# Patient Record
Sex: Male | Born: 1965 | Race: White | Hispanic: No | Marital: Single | Smoking: Never smoker
Health system: Southern US, Community
[De-identification: ages and names within clinical notes are randomized; demographics above are authoritative.]

## PROBLEM LIST (undated history)

## (undated) DIAGNOSIS — I10 Essential (primary) hypertension: Secondary | ICD-10-CM

## (undated) DIAGNOSIS — G40909 Epilepsy, unspecified, not intractable, without status epilepticus: Secondary | ICD-10-CM

## (undated) HISTORY — PX: CHOLECYSTECTOMY: SHX55

---

## 2013-11-25 DIAGNOSIS — Z8249 Family history of ischemic heart disease and other diseases of the circulatory system: Secondary | ICD-10-CM | POA: Insufficient documentation

## 2013-11-25 DIAGNOSIS — J309 Allergic rhinitis, unspecified: Secondary | ICD-10-CM | POA: Insufficient documentation

## 2019-09-24 DIAGNOSIS — G2581 Restless legs syndrome: Secondary | ICD-10-CM | POA: Insufficient documentation

## 2019-09-24 DIAGNOSIS — I10 Essential (primary) hypertension: Secondary | ICD-10-CM | POA: Insufficient documentation

## 2019-09-24 DIAGNOSIS — E782 Mixed hyperlipidemia: Secondary | ICD-10-CM | POA: Insufficient documentation

## 2019-09-24 DIAGNOSIS — E6609 Other obesity due to excess calories: Secondary | ICD-10-CM | POA: Insufficient documentation

## 2019-09-24 DIAGNOSIS — M129 Arthropathy, unspecified: Secondary | ICD-10-CM | POA: Insufficient documentation

## 2021-07-09 ENCOUNTER — Emergency Department (HOSPITAL_COMMUNITY): Payer: 59

## 2021-07-09 ENCOUNTER — Inpatient Hospital Stay (HOSPITAL_COMMUNITY)
Admission: EM | Admit: 2021-07-09 | Discharge: 2021-07-20 | DRG: 511 | Disposition: A | Discharge status: Home Health | Payer: Worker's Compensation | Attending: General Surgery | Admitting: General Surgery

## 2021-07-09 ENCOUNTER — Encounter (HOSPITAL_COMMUNITY): Payer: Self-pay | Admitting: *Deleted

## 2021-07-09 ENCOUNTER — Emergency Department (HOSPITAL_COMMUNITY): Payer: Worker's Compensation

## 2021-07-09 ENCOUNTER — Other Ambulatory Visit: Payer: Self-pay

## 2021-07-09 ENCOUNTER — Emergency Department (HOSPITAL_COMMUNITY): Payer: 59 | Attending: Emergency Medicine

## 2021-07-09 DIAGNOSIS — E785 Hyperlipidemia, unspecified: Secondary | ICD-10-CM | POA: Diagnosis present

## 2021-07-09 DIAGNOSIS — Z79899 Other long term (current) drug therapy: Secondary | ICD-10-CM | POA: Diagnosis not present

## 2021-07-09 DIAGNOSIS — W11XXXA Fall on and from ladder, initial encounter: Secondary | ICD-10-CM | POA: Diagnosis present

## 2021-07-09 DIAGNOSIS — S52611A Displaced fracture of right ulna styloid process, initial encounter for closed fracture: Secondary | ICD-10-CM | POA: Diagnosis present

## 2021-07-09 DIAGNOSIS — F17220 Nicotine dependence, chewing tobacco, uncomplicated: Secondary | ICD-10-CM | POA: Diagnosis present

## 2021-07-09 DIAGNOSIS — Z880 Allergy status to penicillin: Secondary | ICD-10-CM

## 2021-07-09 DIAGNOSIS — M549 Dorsalgia, unspecified: Secondary | ICD-10-CM | POA: Diagnosis present

## 2021-07-09 DIAGNOSIS — R911 Solitary pulmonary nodule: Secondary | ICD-10-CM | POA: Diagnosis present

## 2021-07-09 DIAGNOSIS — S27892A Contusion of other specified intrathoracic organs, initial encounter: Secondary | ICD-10-CM | POA: Diagnosis present

## 2021-07-09 DIAGNOSIS — Z7982 Long term (current) use of aspirin: Secondary | ICD-10-CM

## 2021-07-09 DIAGNOSIS — I10 Essential (primary) hypertension: Secondary | ICD-10-CM | POA: Diagnosis present

## 2021-07-09 DIAGNOSIS — S52571A Other intraarticular fracture of lower end of right radius, initial encounter for closed fracture: Secondary | ICD-10-CM | POA: Diagnosis present

## 2021-07-09 DIAGNOSIS — S22089A Unspecified fracture of T11-T12 vertebra, initial encounter for closed fracture: Secondary | ICD-10-CM | POA: Diagnosis present

## 2021-07-09 DIAGNOSIS — G4733 Obstructive sleep apnea (adult) (pediatric): Secondary | ICD-10-CM | POA: Diagnosis not present

## 2021-07-09 DIAGNOSIS — Z20822 Contact with and (suspected) exposure to covid-19: Secondary | ICD-10-CM | POA: Diagnosis present

## 2021-07-09 DIAGNOSIS — S62101A Fracture of unspecified carpal bone, right wrist, initial encounter for closed fracture: Secondary | ICD-10-CM

## 2021-07-09 DIAGNOSIS — S52501A Unspecified fracture of the lower end of right radius, initial encounter for closed fracture: Secondary | ICD-10-CM | POA: Diagnosis not present

## 2021-07-09 DIAGNOSIS — T1490XA Injury, unspecified, initial encounter: Secondary | ICD-10-CM

## 2021-07-09 DIAGNOSIS — E876 Hypokalemia: Secondary | ICD-10-CM | POA: Diagnosis present

## 2021-07-09 DIAGNOSIS — G40909 Epilepsy, unspecified, not intractable, without status epilepticus: Secondary | ICD-10-CM | POA: Diagnosis present

## 2021-07-09 DIAGNOSIS — W19XXXA Unspecified fall, initial encounter: Secondary | ICD-10-CM | POA: Diagnosis present

## 2021-07-09 DIAGNOSIS — S22009A Unspecified fracture of unspecified thoracic vertebra, initial encounter for closed fracture: Secondary | ICD-10-CM

## 2021-07-09 DIAGNOSIS — M4854XA Collapsed vertebra, not elsewhere classified, thoracic region, initial encounter for fracture: Secondary | ICD-10-CM | POA: Diagnosis present

## 2021-07-09 DIAGNOSIS — S22080A Wedge compression fracture of T11-T12 vertebra, initial encounter for closed fracture: Secondary | ICD-10-CM

## 2021-07-09 HISTORY — DX: Essential (primary) hypertension: I10

## 2021-07-09 HISTORY — DX: Epilepsy, unspecified, not intractable, without status epilepticus: G40.909

## 2021-07-09 LAB — PROTIME-INR
INR: 0.9 (ref 0.8–1.2)
Prothrombin Time: 12.4 seconds (ref 11.4–15.2)

## 2021-07-09 LAB — BASIC METABOLIC PANEL
Anion gap: 8 (ref 5–15)
BUN: 20 mg/dL (ref 6–20)
CO2: 21 mmol/L — ABNORMAL LOW (ref 22–32)
Calcium: 9.1 mg/dL (ref 8.9–10.3)
Chloride: 109 mmol/L (ref 98–111)
Creatinine, Ser: 1.07 mg/dL (ref 0.61–1.24)
GFR, Estimated: >60 mL/min (ref >60)
Glucose, Bld: 98 mg/dL (ref 70–99)
Potassium: 3.3 mmol/L — ABNORMAL LOW (ref 3.5–5.1)
Sodium: 138 mmol/L (ref 135–145)

## 2021-07-09 LAB — CBC WITH DIFFERENTIAL/PLATELET
Abs Immature Granulocytes: 0.09 10*3/uL — ABNORMAL HIGH (ref 0.00–0.07)
Basophils Absolute: 0 10*3/uL (ref 0.0–0.1)
Basophils Relative: 0 %
Eosinophils Absolute: 0.1 10*3/uL (ref 0.0–0.5)
Eosinophils Relative: 1 %
HCT: 36 % — ABNORMAL LOW (ref 39.0–52.0)
Hemoglobin: 11.5 g/dL — ABNORMAL LOW (ref 13.0–17.0)
Immature Granulocytes: 1 %
Lymphocytes Relative: 8 %
Lymphs Abs: 0.9 10*3/uL (ref 0.7–4.0)
MCH: 26.8 pg (ref 26.0–34.0)
MCHC: 31.9 g/dL (ref 30.0–36.0)
MCV: 83.9 fL (ref 80.0–100.0)
Monocytes Absolute: 0.6 10*3/uL (ref 0.1–1.0)
Monocytes Relative: 5 %
Neutro Abs: 10.3 10*3/uL — ABNORMAL HIGH (ref 1.7–7.7)
Neutrophils Relative %: 85 %
Platelets: 294 10*3/uL (ref 150–400)
RBC: 4.29 MIL/uL (ref 4.22–5.81)
RDW: 13.3 % (ref 11.5–15.5)
WBC: 12 10*3/uL — ABNORMAL HIGH (ref 4.0–10.5)
nRBC: 0 % (ref 0.0–0.2)

## 2021-07-09 MED ORDER — LOSARTAN POTASSIUM-HCTZ 50-12.5 MG PO TABS: 1.0000 | ORAL_TABLET | Freq: Every morning | ORAL | Status: DC

## 2021-07-09 MED ORDER — LORAZEPAM 2 MG/ML IJ SOLN
1.0000 mg | Freq: Once | INTRAMUSCULAR | Status: AC
  Administered 2021-07-09: 1 mg via INTRAVENOUS
  Filled 2021-07-09: qty 1

## 2021-07-09 MED ORDER — HYDROMORPHONE HCL 1 MG/ML IJ SOLN
1.0000 mg | Freq: Once | INTRAMUSCULAR | Status: AC
  Administered 2021-07-09: 1 mg via INTRAVENOUS
  Filled 2021-07-09 (×2): qty 1

## 2021-07-09 MED ORDER — GABAPENTIN 400 MG PO CAPS
800.0000 mg | ORAL_CAPSULE | Freq: Two times a day (BID) | ORAL | Status: DC
  Administered 2021-07-10 – 2021-07-20 (×22): 800 mg via ORAL
  Filled 2021-07-09 (×24): qty 2

## 2021-07-09 MED ORDER — SODIUM CHLORIDE 0.9 % IV SOLN: 250.0000 mL | INTRAVENOUS | Status: DC | PRN

## 2021-07-09 MED ORDER — ASPIRIN EC 81 MG PO TBEC
81.0000 mg | DELAYED_RELEASE_TABLET | Freq: Every day | ORAL | Status: DC
  Administered 2021-07-10 – 2021-07-19 (×10): 81 mg via ORAL
  Filled 2021-07-09 (×11): qty 1

## 2021-07-09 MED ORDER — ENOXAPARIN SODIUM 30 MG/0.3ML IJ SOSY
30.0000 mg | PREFILLED_SYRINGE | Freq: Two times a day (BID) | INTRAMUSCULAR | Status: DC
  Administered 2021-07-11 – 2021-07-20 (×18): 30 mg via SUBCUTANEOUS
  Filled 2021-07-09 (×19): qty 0.3

## 2021-07-09 MED ORDER — SODIUM CHLORIDE 0.9% FLUSH: 3.0000 mL | INTRAVENOUS | Status: DC | PRN

## 2021-07-09 MED ORDER — LOSARTAN POTASSIUM 50 MG PO TABS
50.0000 mg | ORAL_TABLET | Freq: Every day | ORAL | Status: DC
  Administered 2021-07-11 – 2021-07-20 (×9): 50 mg via ORAL
  Filled 2021-07-09 (×9): qty 1

## 2021-07-09 MED ORDER — ACETAMINOPHEN 500 MG PO TABS: 1000.0000 mg | ORAL_TABLET | Freq: Every evening | ORAL | Status: DC | PRN

## 2021-07-09 MED ORDER — NAPROXEN 250 MG PO TABS
500.0000 mg | ORAL_TABLET | Freq: Two times a day (BID) | ORAL | Status: DC
  Administered 2021-07-10: 500 mg via ORAL
  Filled 2021-07-09: qty 2

## 2021-07-09 MED ORDER — IOHEXOL 350 MG/ML SOLN
100.0000 mL | Freq: Once | INTRAVENOUS | Status: AC | PRN
  Administered 2021-07-09: 100 mL via INTRAVENOUS

## 2021-07-09 MED ORDER — MORPHINE SULFATE (PF) 4 MG/ML IV SOLN
4.0000 mg | Freq: Once | INTRAVENOUS | Status: AC
  Administered 2021-07-09: 4 mg via INTRAVENOUS
  Filled 2021-07-09: qty 1

## 2021-07-09 MED ORDER — DOCUSATE SODIUM 100 MG PO CAPS
100.0000 mg | ORAL_CAPSULE | Freq: Two times a day (BID) | ORAL | Status: DC
  Administered 2021-07-10 – 2021-07-20 (×21): 100 mg via ORAL
  Filled 2021-07-09 (×21): qty 1

## 2021-07-09 MED ORDER — LORAZEPAM 2 MG/ML IJ SOLN
1.0000 mg | Freq: Once | INTRAMUSCULAR | Status: DC
Start: 2021-07-09 — End: 2021-07-20
  Filled 2021-07-09: qty 1

## 2021-07-09 MED ORDER — ONDANSETRON 4 MG PO TBDP: 4.0000 mg | ORAL_TABLET | Freq: Four times a day (QID) | ORAL | Status: DC | PRN

## 2021-07-09 MED ORDER — DICLOFENAC SODIUM 1 % EX GEL: 1.0000 "application " | Freq: Every day | CUTANEOUS | Status: DC | PRN

## 2021-07-09 MED ORDER — ATORVASTATIN CALCIUM 10 MG PO TABS
10.0000 mg | ORAL_TABLET | ORAL | Status: DC
  Administered 2021-07-10 – 2021-07-19 (×6): 10 mg via ORAL
  Filled 2021-07-09 (×8): qty 1

## 2021-07-09 MED ORDER — TOPIRAMATE 25 MG PO TABS
100.0000 mg | ORAL_TABLET | Freq: Two times a day (BID) | ORAL | Status: DC
  Administered 2021-07-10 – 2021-07-20 (×21): 100 mg via ORAL
  Filled 2021-07-09 (×22): qty 4

## 2021-07-09 MED ORDER — SODIUM CHLORIDE 0.9% FLUSH
3.0000 mL | Freq: Two times a day (BID) | INTRAVENOUS | Status: DC
  Administered 2021-07-10 – 2021-07-15 (×11): 3 mL via INTRAVENOUS

## 2021-07-09 MED ORDER — ONDANSETRON HCL 4 MG/2ML IJ SOLN
4.0000 mg | Freq: Four times a day (QID) | INTRAMUSCULAR | Status: DC | PRN
  Administered 2021-07-10: 4 mg via INTRAVENOUS
  Filled 2021-07-09: qty 2

## 2021-07-09 MED ORDER — OXYCODONE HCL 5 MG PO TABS
5.0000 mg | ORAL_TABLET | ORAL | Status: DC | PRN
  Administered 2021-07-10: 5 mg via ORAL
  Filled 2021-07-09: qty 1

## 2021-07-09 MED ORDER — HYDROMORPHONE HCL 1 MG/ML IJ SOLN
1.0000 mg | Freq: Once | INTRAMUSCULAR | Status: AC
  Administered 2021-07-09: 1 mg via INTRAVENOUS
  Filled 2021-07-09: qty 1

## 2021-07-09 MED ORDER — HYDROMORPHONE HCL 1 MG/ML IJ SOLN
0.5000 mg | INTRAMUSCULAR | Status: DC | PRN
Start: 2021-07-09 — End: 2021-07-10
  Administered 2021-07-10 (×2): 1 mg via INTRAVENOUS
  Filled 2021-07-09 (×2): qty 1

## 2021-07-09 MED ORDER — MELATONIN 3 MG PO TABS
3.0000 mg | ORAL_TABLET | Freq: Every evening | ORAL | Status: DC | PRN
  Administered 2021-07-16 – 2021-07-18 (×3): 3 mg via ORAL
  Filled 2021-07-09 (×4): qty 1

## 2021-07-09 MED ORDER — ROPINIROLE HCL 0.5 MG PO TABS
1.0000 mg | ORAL_TABLET | Freq: Two times a day (BID) | ORAL | Status: DC
  Administered 2021-07-10 – 2021-07-20 (×21): 1 mg via ORAL
  Filled 2021-07-09 (×14): qty 2
  Filled 2021-07-09: qty 1
  Filled 2021-07-09 (×7): qty 2

## 2021-07-09 MED ORDER — VENLAFAXINE HCL ER 75 MG PO CP24
75.0000 mg | ORAL_CAPSULE | Freq: Every morning | ORAL | Status: DC
  Administered 2021-07-10 – 2021-07-20 (×10): 75 mg via ORAL
  Filled 2021-07-09 (×11): qty 1

## 2021-07-09 MED ORDER — PANTOPRAZOLE SODIUM 40 MG PO TBEC
40.0000 mg | DELAYED_RELEASE_TABLET | Freq: Every day | ORAL | Status: DC
  Administered 2021-07-10 – 2021-07-20 (×10): 40 mg via ORAL
  Filled 2021-07-09 (×10): qty 1

## 2021-07-09 MED ORDER — SODIUM CHLORIDE 0.9 % IV BOLUS
500.0000 mL | Freq: Once | INTRAVENOUS | Status: AC
  Administered 2021-07-09: 500 mL via INTRAVENOUS

## 2021-07-09 MED ORDER — HYDROCHLOROTHIAZIDE 12.5 MG PO CAPS
12.5000 mg | ORAL_CAPSULE | Freq: Every day | ORAL | Status: DC
  Administered 2021-07-10 – 2021-07-20 (×10): 12.5 mg via ORAL
  Filled 2021-07-09 (×11): qty 1

## 2021-07-09 NOTE — ED Triage Notes (Signed)
Patient presents to ed via [facility] states he was at work was on a ladder beside a train car and either his foot or hand slipped and he fell approx. 6-7 feet hitting his head and landing on his hands and feet.c/o right wrist pain. Obv. Deformity positive pulse and cap refill. Abrasion to the top of his head.

## 2021-07-09 NOTE — H&P (Signed)
History   Craig Church is an 55 y.o. male.   Chief Complaint:  Chief Complaint  Patient presents with   Fall    Patient is a 55 year old male who presented to the emergency department level 2 trauma after a fall.  He was on the ladder and fell approximately 7 feet backwards.  He struck his head and his back.  He denied loss of consciousness.  He does have a history of seizure disorder but this was not precipitated by a seizure.  His main complaint now is right wrist pain and back pain.  He denies n/v.     Past Medical History:  Diagnosis Date   Hypertension    Seizure disorder [facility])     Past Surgical History:  Procedure Laterality Date   CHOLECYSTECTOMY      No family history on file. Social History:  reports that he has never smoked. His smokeless tobacco use includes chew. He reports that he does not drink alcohol and does not use drugs.  Allergies   Allergies  Allergen Reactions   Penicillins Other (See Comments)    Unknown childhood reaction    Home Medications   Current Meds  Medication Sig   acetaminophen (TYLENOL) 650 MG CR tablet Take 1,300 mg by mouth at bedtime as needed (arthritis pain).   aspirin EC 81 MG tablet Take 81 mg by mouth at bedtime. Swallow whole.   atorvastatin (LIPITOR) 10 MG tablet Take 10 mg by mouth See admin instructions. Take one tablet (10 mg) by mouth every other night   diclofenac Sodium (VOLTAREN) 1 % GEL Apply 1 application topically daily as needed (arthritis pain).   esomeprazole (NEXIUM) 20 MG capsule Take 20 mg by mouth 2 (two) times daily before a meal.   gabapentin (NEURONTIN) 800 MG tablet Take 800 mg by mouth 2 (two) times daily.   losartan-hydrochlorothiazide (HYZAAR) 50-12.5 MG tablet Take 1 tablet by mouth every morning.   naproxen (NAPROSYN) 500 MG tablet Take 500 mg by mouth 2 (two) times daily.   rOPINIRole (REQUIP) 1 MG tablet Take 1 mg by mouth 2 (two) times daily.   topiramate (TOPAMAX) 100 MG tablet Take 100 mg by mouth  2 (two) times daily.   venlafaxine XR (EFFEXOR-XR) 37.5 MG 24 hr capsule Take 75 mg by mouth every morning.     Trauma Course   Results for orders placed or performed during the hospital encounter of 07/09/21 (from the past 48 hour(s))  CBC with Differential     Status: Abnormal   Collection Time: 07/09/21  4:30 PM  Result Value Ref Range   WBC 12.0 (H) 4.0 - 10.5 K/uL   RBC 4.29 4.22 - 5.81 MIL/uL   Hemoglobin 11.5 (L) 13.0 - 17.0 g/dL   HCT 16.1 (L) 09.6 - 04.5 %   MCV 83.9 80.0 - 100.0 fL   MCH 26.8 26.0 - 34.0 pg   MCHC 31.9 30.0 - 36.0 g/dL   RDW 40.9 81.1 - 91.4 %   Platelets 294 150 - 400 K/uL   nRBC 0.0 0.0 - 0.2 %   Neutrophils Relative % 85 %   Neutro Abs 10.3 (H) 1.7 - 7.7 K/uL   Lymphocytes Relative 8 %   Lymphs Abs 0.9 0.7 - 4.0 K/uL   Monocytes Relative 5 %   Monocytes Absolute 0.6 0.1 - 1.0 K/uL   Eosinophils Relative 1 %   Eosinophils Absolute 0.1 0.0 - 0.5 K/uL   Basophils Relative 0 %   Basophils  Absolute 0.0 0.0 - 0.1 K/uL   Immature Granulocytes 1 %   Abs Immature Granulocytes 0.09 (H) 0.00 - 0.07 K/uL    Comment: Performed at [facility], [address] [place], [place] [address]  Basic metabolic panel     Status: Abnormal   Collection Time: 07/09/21  4:30 PM  Result Value Ref Range   Sodium 138 135 - 145 mmol/L   Potassium 3.3 (L) 3.5 - 5.1 mmol/L   Chloride 109 98 - 111 mmol/L   CO2 21 (L) 22 - 32 mmol/L   Glucose, Bld 98 70 - 99 mg/dL    Comment: Glucose reference range applies only to samples taken after fasting for at least 8 hours.   BUN 20 6 - 20 mg/dL   Creatinine, Ser 6.04 0.61 - 1.24 mg/dL   Calcium 9.1 8.9 - 54.0 mg/dL   GFR, Estimated >98 >11 mL/min    Comment: (NOTE) Calculated using the CKD-EPI Creatinine Equation (2021)    Anion gap 8 5 - 15    Comment: Performed at [facility], [address] [place], [place] [address]  Protime-INR     Status: None   Collection Time: 07/09/21  4:30 PM  Result Value Ref Range    Prothrombin Time 12.4 11.4 - 15.2 seconds   INR 0.9 0.8 - 1.2    Comment: (NOTE) INR goal varies based on device and disease states. Performed at [facility], [address], [place], [place] [address]    DG Forearm Right  Result Date: 07/09/2021 CLINICAL DATA:  Fall, deformity EXAM: RIGHT WRIST - COMPLETE 3+ VIEW; RIGHT FOREARM - 2 VIEW; RIGHT HAND - COMPLETE 3+ VIEW COMPARISON:  None. FINDINGS: Forearm: The comminuted and displaced distal radial fracture is assessed below. No acute fracture of the proximal radius or ulna is identified. There is lucency through an olecranon enthesophyte which may reflect a remote fracture. There is no significant overlying soft tissue swelling to suggest acute fracture. Elbow alignment appears maintained. Wrist: There is a severely comminuted impacted fracture of the distal radius with intra-articular extension. There is mild dorsal displacement and angulation of the dominant distal fragment. There is a mildly displaced ulnar styloid fracture. Radiocarpal alignment is grossly intact. There is significant surrounding soft tissue swelling. Hand: No additional fracture is seen. Alignment of the bones of the hand is within normal limits. The joint spaces are preserved. There is a punctate radiopaque foreign body within the soft tissues of the index finger overlying the radial aspect of the index finger proximal phalanx. IMPRESSION: 1. Severely comminuted and mildly displaced impacted fracture of the distal radius with intra-articular extension. 2. Mildly displaced ulnar styloid fracture. 3. Punctate radiopaque foreign body within the soft tissues along the radial aspect of the index finger proximal phalanx. 4. Probable remote fracture through an olecranon enthesophyte. Electronically Signed   By: Lesia Hausen M.D.   On: 07/09/2021 15:42   DG Wrist Complete Right  Result Date: 07/09/2021 CLINICAL DATA:  Right wrist reduction. EXAM: RIGHT WRIST - COMPLETE 3+ VIEW  COMPARISON:  Pre reduction radiograph earlier today FINDINGS: Frontal and lateral views of the right wrist obtained with overlying splint material in place. Improved alignment of comminuted and displaced distal radius fracture with mild residual radial displacement. Unchanged alignment of ulnar styloid fracture. IMPRESSION: Improved alignment of comminuted distal radius fracture postreduction with mild residual radial displacement. Unchanged ulnar styloid fracture. Electronically Signed   By: Ivette Loyal.D.  On: 07/09/2021 20:15   DG Wrist Complete Right  Result Date: 07/09/2021 CLINICAL DATA:  Fall, deformity EXAM: RIGHT WRIST - COMPLETE 3+ VIEW; RIGHT FOREARM - 2 VIEW; RIGHT HAND - COMPLETE 3+ VIEW COMPARISON:  None. FINDINGS: Forearm: The comminuted and displaced distal radial fracture is assessed below. No acute fracture of the proximal radius or ulna is identified. There is lucency through an olecranon enthesophyte which may reflect a remote fracture. There is no significant overlying soft tissue swelling to suggest acute fracture. Elbow alignment appears maintained. Wrist: There is a severely comminuted impacted fracture of the distal radius with intra-articular extension. There is mild dorsal displacement and angulation of the dominant distal fragment. There is a mildly displaced ulnar styloid fracture. Radiocarpal alignment is grossly intact. There is significant surrounding soft tissue swelling. Hand: No additional fracture is seen. Alignment of the bones of the hand is within normal limits. The joint spaces are preserved. There is a punctate radiopaque foreign body within the soft tissues of the index finger overlying the radial aspect of the index finger proximal phalanx. IMPRESSION: 1. Severely comminuted and mildly displaced impacted fracture of the distal radius with intra-articular extension. 2. Mildly displaced ulnar styloid fracture. 3. Punctate radiopaque foreign body within the soft  tissues along the radial aspect of the index finger proximal phalanx. 4. Probable remote fracture through an olecranon enthesophyte. Electronically Signed   By: Lesia Hausen M.D.   On: 07/09/2021 15:42   CT Head Wo Contrast  Result Date: 07/09/2021 CLINICAL DATA:  Trauma, low back pain. EXAM: CT HEAD WITHOUT CONTRAST CT CERVICAL SPINE WITHOUT CONTRAST CT CHEST, ABDOMEN AND PELVIS WITH CONTRAST TECHNIQUE: Contiguous axial images were obtained from the base of the skull through the vertex without intravenous contrast. Multidetector CT imaging of the cervical spine was performed without intravenous contrast. Multiplanar CT image reconstructions were also generated. Multidetector CT imaging of the chest, abdomen and pelvis was performed following the standard protocol during bolus administration of intravenous contrast. CONTRAST:  OMNIPAQUE IOHEXOL 350 MG/ML SOLN COMPARISON:  None. FINDINGS: CT HEAD FINDINGS Brain: No evidence of large-territorial acute infarction. No parenchymal hemorrhage. 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 No extra-axial collection. No mass effect or midline shift. No hydrocephalus. Basilar cisterns are patent. Partial empty sella. Vascular: No hyperdense vessel. Skull: No acute fracture or focal lesion. Sinuses/Orbits: Paranasal sinuses and mastoid air cells are clear. The orbits are unremarkable. Other: None. CT CERVICAL FINDINGS Alignment: Normal. Skull base and vertebrae: Partial fusion of the C4-C5 levels. Multilevel degenerative changes of the spine. 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 No severe osseous central canal stenosis. No acute fracture. No aggressive appearing focal osseous lesion or focal pathologic process. Soft tissues and spinal canal: No prevertebral fluid or swelling. No visible canal hematoma. Upper chest: Unremarkable. Other: None. CT CHEST FINDINGS  Ports and Devices: None. Lungs/airways: Bilateral lower lobe subsegmental atelectasis. No focal consolidation. There is a difficult to measure slightly spiculated 1.1 x 0.5 cm right lower lobe subpleural nodule (5:87, 7:108). Associated mild tethering of the pleura is noted. Few scattered calcified pulmonary micronodules within the left lung. No pulmonary mass. No pulmonary contusion or laceration. No pneumatocele formation. The central airways are patent. Pleura: No pleural effusion. No pneumothorax. No hemothorax. Lymph Nodes: No mediastinal, hilar, or axillary lymphadenopathy. Mediastinum: No pneumomediastinum. Trace high density free fluid within the superior anterior mediastinum (3:21) just posterior to the  manubrium. Normal fat plane between this fluid and the aorta is noted. No aortic injury. The thoracic aorta is normal in caliber. The heart is normal in size. No significant pericardial effusion. The esophagus is unremarkable. The thyroid is unremarkable. Chest Wall / Breasts: No chest wall mass. Musculoskeletal: No acute rib or sternal fracture. Please see separately dictated CT thoracic spine 07/09/2021. partially visualized right upper extremity with a partially visualized comminuted radial fracture. CT ABDOMEN AND PELVIS FINDINGS Liver: Not enlarged. No focal lesion. No laceration or subcapsular hematoma. Biliary System: Status post cholecystectomy. No biliary ductal dilatation. Pancreas: Normal pancreatic contour. No main pancreatic duct dilatation. Spleen: Not enlarged. No focal lesion. No laceration, subcapsular hematoma, or vascular injury. Adrenal Glands: No nodularity bilaterally. Kidneys: Bilateral kidneys enhance symmetrically. No hydronephrosis. No contusion, laceration, or subcapsular hematoma. No injury to the vascular structures or collecting systems. No hydroureter. The urinary bladder is unremarkable. On delayed imaging, there is no urothelial wall thickening and there are no filling  defects in the opacified portions of the bilateral collecting systems or ureters. Bowel: No small or large bowel wall thickening or dilatation. Question appendiceal stump with no findings of right lower quadrant inflammatory change Mesentery, Omentum, and Peritoneum: No simple free fluid ascites. No pneumoperitoneum. No hemoperitoneum. No mesenteric hematoma identified. No organized fluid collection. Pelvic Organs: Normal. Lymph Nodes: No abdominal, pelvic, inguinal lymphadenopathy. Vasculature: No abdominal aorta or iliac aneurysm. No active contrast extravasation or pseudoaneurysm. Musculoskeletal: No significant soft tissue hematoma. Tiny fat containing umbilical hernia. No acute pelvic fracture. Please see separately dictated CT lumbar spine 07/09/2021. IMPRESSION: 1. No acute intracranial abnormality. 2. No acute displaced fracture or traumatic listhesis of the cervical spine. 3. Trace volume supero-anterior mediastinal hematoma posterior to the manubrium of unclear etiology. No associated aortic injury or findings to suggest aortic injury. No associated sternal/manubrial or costochondral junction fractures. 4. Otherwise no acute traumatic injury to the chest, abdomen, or pelvis. 5. Please see separately dictated CT thoracic and lumbar spine 07/09/2021. 6. Partially visualized distal comminuted right radial fracture. Other imaging findings of potential clinical significance: 1. 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 2. Partial empty sella. Findings is often a normal anatomic variant but can be associated with idiopathic intracranial hypertension (pseudotumor cerebri). 3. 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 4. A 0.8 cm left lower lobe pulmonary nodule. Non-contrast chest CT at 6-12 months is recommended. If the nodule is stable at time of repeat CT, then future CT at 18-24 months (from today's scan) is considered optional for low-risk  patients, but is recommended for high-risk patients. This recommendation follows the consensus statement: Guidelines for Management of Incidental Pulmonary Nodules Detected on CT Images: From the Fleischner Society 2017; Radiology 2017; 284:228-243. Electronically Signed   By: Tish Frederickson M.D.   On: 07/09/2021 20:40   CT Cervical Spine Wo Contrast  Result Date: 07/09/2021 CLINICAL DATA:  Trauma, low back pain. EXAM: CT HEAD WITHOUT CONTRAST CT CERVICAL SPINE WITHOUT CONTRAST CT CHEST, ABDOMEN AND PELVIS WITH CONTRAST TECHNIQUE: Contiguous axial images were obtained from the base of the skull through the vertex without intravenous contrast. Multidetector CT imaging of the cervical spine was performed without intravenous contrast. Multiplanar CT image reconstructions were also generated. Multidetector CT imaging of the chest, abdomen and pelvis was performed following the standard protocol during bolus administration of intravenous contrast. CONTRAST:  OMNIPAQUE IOHEXOL 350 MG/ML SOLN COMPARISON:  None. FINDINGS: CT  HEAD FINDINGS Brain: No evidence of large-territorial acute infarction. No parenchymal hemorrhage. 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 No extra-axial collection. No mass effect or midline shift. No hydrocephalus. Basilar cisterns are patent. Partial empty sella. Vascular: No hyperdense vessel. Skull: No acute fracture or focal lesion. Sinuses/Orbits: Paranasal sinuses and mastoid air cells are clear. The orbits are unremarkable. Other: None. CT CERVICAL FINDINGS Alignment: Normal. Skull base and vertebrae: Partial fusion of the C4-C5 levels. Multilevel degenerative changes of the spine. 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 No severe osseous central canal stenosis. No acute fracture. No aggressive appearing focal osseous lesion or focal pathologic process. Soft tissues and spinal  canal: No prevertebral fluid or swelling. No visible canal hematoma. Upper chest: Unremarkable. Other: None. CT CHEST FINDINGS Ports and Devices: None. Lungs/airways: Bilateral lower lobe subsegmental atelectasis. No focal consolidation. There is a difficult to measure slightly spiculated 1.1 x 0.5 cm right lower lobe subpleural nodule (5:87, 7:108). Associated mild tethering of the pleura is noted. Few scattered calcified pulmonary micronodules within the left lung. No pulmonary mass. No pulmonary contusion or laceration. No pneumatocele formation. The central airways are patent. Pleura: No pleural effusion. No pneumothorax. No hemothorax. Lymph Nodes: No mediastinal, hilar, or axillary lymphadenopathy. Mediastinum: No pneumomediastinum. Trace high density free fluid within the superior anterior mediastinum (3:21) just posterior to the manubrium. Normal fat plane between this fluid and the aorta is noted. No aortic injury. The thoracic aorta is normal in caliber. The heart is normal in size. No significant pericardial effusion. The esophagus is unremarkable. The thyroid is unremarkable. Chest Wall / Breasts: No chest wall mass. Musculoskeletal: No acute rib or sternal fracture. Please see separately dictated CT thoracic spine 07/09/2021. partially visualized right upper extremity with a partially visualized comminuted radial fracture. CT ABDOMEN AND PELVIS FINDINGS Liver: Not enlarged. No focal lesion. No laceration or subcapsular hematoma. Biliary System: Status post cholecystectomy. No biliary ductal dilatation. Pancreas: Normal pancreatic contour. No main pancreatic duct dilatation. Spleen: Not enlarged. No focal lesion. No laceration, subcapsular hematoma, or vascular injury. Adrenal Glands: No nodularity bilaterally. Kidneys: Bilateral kidneys enhance symmetrically. No hydronephrosis. No contusion, laceration, or subcapsular hematoma. No injury to the vascular structures or collecting systems. No hydroureter.  The urinary bladder is unremarkable. On delayed imaging, there is no urothelial wall thickening and there are no filling defects in the opacified portions of the bilateral collecting systems or ureters. Bowel: No small or large bowel wall thickening or dilatation. Question appendiceal stump with no findings of right lower quadrant inflammatory change Mesentery, Omentum, and Peritoneum: No simple free fluid ascites. No pneumoperitoneum. No hemoperitoneum. No mesenteric hematoma identified. No organized fluid collection. Pelvic Organs: Normal. Lymph Nodes: No abdominal, pelvic, inguinal lymphadenopathy. Vasculature: No abdominal aorta or iliac aneurysm. No active contrast extravasation or pseudoaneurysm. Musculoskeletal: No significant soft tissue hematoma. Tiny fat containing umbilical hernia. No acute pelvic fracture. Please see separately dictated CT lumbar spine 07/09/2021. IMPRESSION: 1. No acute intracranial abnormality. 2. No acute displaced fracture or traumatic listhesis of the cervical spine. 3. Trace volume supero-anterior mediastinal hematoma posterior to the manubrium of unclear etiology. No associated aortic injury or findings to suggest aortic injury. No associated sternal/manubrial or costochondral junction fractures. 4. Otherwise no acute traumatic injury to the chest, abdomen, or pelvis. 5. Please see separately dictated CT thoracic and lumbar spine 07/09/2021. 6. Partially visualized distal comminuted right radial fracture. Other imaging findings  of potential clinical significance: 1. 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 2. Partial empty sella. Findings is often a normal anatomic variant but can be associated with idiopathic intracranial hypertension (pseudotumor cerebri). 3. 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 4. A 0.8 cm left lower lobe pulmonary nodule. Non-contrast chest CT at 6-12 months is recommended. If the  nodule is stable at time of repeat CT, then future CT at 18-24 months (from today's scan) is considered optional for low-risk patients, but is recommended for high-risk patients. This recommendation follows the consensus statement: Guidelines for Management of Incidental Pulmonary Nodules Detected on CT Images: From the Fleischner Society 2017; Radiology 2017; 284:228-243. Electronically Signed   By: Tish Frederickson M.D.   On: 07/09/2021 20:40   CT CHEST ABDOMEN PELVIS W CONTRAST  Result Date: 07/09/2021 CLINICAL DATA:  Trauma, low back pain. EXAM: CT HEAD WITHOUT CONTRAST CT CERVICAL SPINE WITHOUT CONTRAST CT CHEST, ABDOMEN AND PELVIS WITH CONTRAST TECHNIQUE: Contiguous axial images were obtained from the base of the skull through the vertex without intravenous contrast. Multidetector CT imaging of the cervical spine was performed without intravenous contrast. Multiplanar CT image reconstructions were also generated. Multidetector CT imaging of the chest, abdomen and pelvis was performed following the standard protocol during bolus administration of intravenous contrast. CONTRAST:  OMNIPAQUE IOHEXOL 350 MG/ML SOLN COMPARISON:  None. FINDINGS: CT HEAD FINDINGS Brain: No evidence of large-territorial acute infarction. No parenchymal hemorrhage. 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 No extra-axial collection. No mass effect or midline shift. No hydrocephalus. Basilar cisterns are patent. Partial empty sella. Vascular: No hyperdense vessel. Skull: No acute fracture or focal lesion. Sinuses/Orbits: Paranasal sinuses and mastoid air cells are clear. The orbits are unremarkable. Other: None. CT CERVICAL FINDINGS Alignment: Normal. Skull base and vertebrae: Partial fusion of the C4-C5 levels. Multilevel degenerative changes of the spine. 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 No severe osseous central  canal stenosis. No acute fracture. No aggressive appearing focal osseous lesion or focal pathologic process. Soft tissues and spinal canal: No prevertebral fluid or swelling. No visible canal hematoma. Upper chest: Unremarkable. Other: None. CT CHEST FINDINGS Ports and Devices: None. Lungs/airways: Bilateral lower lobe subsegmental atelectasis. No focal consolidation. There is a difficult to measure slightly spiculated 1.1 x 0.5 cm right lower lobe subpleural nodule (5:87, 7:108). Associated mild tethering of the pleura is noted. Few scattered calcified pulmonary micronodules within the left lung. No pulmonary mass. No pulmonary contusion or laceration. No pneumatocele formation. The central airways are patent. Pleura: No pleural effusion. No pneumothorax. No hemothorax. Lymph Nodes: No mediastinal, hilar, or axillary lymphadenopathy. Mediastinum: No pneumomediastinum. Trace high density free fluid within the superior anterior mediastinum (3:21) just posterior to the manubrium. Normal fat plane between this fluid and the aorta is noted. No aortic injury. The thoracic aorta is normal in caliber. The heart is normal in size. No significant pericardial effusion. The esophagus is unremarkable. The thyroid is unremarkable. Chest Wall / Breasts: No chest wall mass. Musculoskeletal: No acute rib or sternal fracture. Please see separately dictated CT thoracic spine 07/09/2021. partially visualized right upper extremity with a partially visualized comminuted radial fracture. CT ABDOMEN AND PELVIS FINDINGS Liver: Not enlarged. No focal lesion. No laceration or subcapsular hematoma. Biliary System: Status post cholecystectomy. No biliary ductal dilatation. Pancreas: Normal pancreatic contour. No main pancreatic  duct dilatation. Spleen: Not enlarged. No focal lesion. No laceration, subcapsular hematoma, or vascular injury. Adrenal Glands: No nodularity bilaterally. Kidneys: Bilateral kidneys enhance symmetrically. No  hydronephrosis. No contusion, laceration, or subcapsular hematoma. No injury to the vascular structures or collecting systems. No hydroureter. The urinary bladder is unremarkable. On delayed imaging, there is no urothelial wall thickening and there are no filling defects in the opacified portions of the bilateral collecting systems or ureters. Bowel: No small or large bowel wall thickening or dilatation. Question appendiceal stump with no findings of right lower quadrant inflammatory change Mesentery, Omentum, and Peritoneum: No simple free fluid ascites. No pneumoperitoneum. No hemoperitoneum. No mesenteric hematoma identified. No organized fluid collection. Pelvic Organs: Normal. Lymph Nodes: No abdominal, pelvic, inguinal lymphadenopathy. Vasculature: No abdominal aorta or iliac aneurysm. No active contrast extravasation or pseudoaneurysm. Musculoskeletal: No significant soft tissue hematoma. Tiny fat containing umbilical hernia. No acute pelvic fracture. Please see separately dictated CT lumbar spine 07/09/2021. IMPRESSION: 1. No acute intracranial abnormality. 2. No acute displaced fracture or traumatic listhesis of the cervical spine. 3. Trace volume supero-anterior mediastinal hematoma posterior to the manubrium of unclear etiology. No associated aortic injury or findings to suggest aortic injury. No associated sternal/manubrial or costochondral junction fractures. 4. Otherwise no acute traumatic injury to the chest, abdomen, or pelvis. 5. Please see separately dictated CT thoracic and lumbar spine 07/09/2021. 6. Partially visualized distal comminuted right radial fracture. Other imaging findings of potential clinical significance: 1. 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 2. Partial empty sella. Findings is often a normal anatomic variant but can be associated with idiopathic intracranial hypertension (pseudotumor cerebri). 3. Severe left C3-C4 osseous neural foraminal  stenosis due to degenerative changes. 4. A 0.8 cm left lower lobe pulmonary nodule. Non-contrast chest CT at 6-12 months is recommended. If the nodule is stable at time of repeat CT, then future CT at 18-24 months (from today's scan) is considered optional for low-risk patients, but is recommended for high-risk patients. This recommendation follows the consensus statement: Guidelines for Management of Incidental Pulmonary Nodules Detected on CT Images: From the Fleischner Society 2017; Radiology 2017; 284:228-243. Electronically Signed   By: Tish Frederickson M.D.   On: 07/09/2021 20:40   CT T-SPINE NO CHARGE  Addendum Date: 07/09/2021   ADDENDUM REPORT: 07/09/2021 21:12 ADDENDUM: CT thoracic spine impression #2 discussed with Dr. Silverio Lay by telephone at 8:55 p.m. on 07/09/2021. Electronically Signed   By: Jackey Loge D.O.   On: 07/09/2021 21:12   Result Date: 07/09/2021 CLINICAL DATA:  Trauma. EXAM: CT THORACIC AND LUMBAR SPINE WITHOUT CONTRAST TECHNIQUE: Multidetector CT imaging of the thoracic and lumbar spine was performed without contrast. Multiplanar CT image reconstructions were also generated. COMPARISON:  None. FINDINGS: CT THORACIC SPINE FINDINGS In correlating with the concurrently performed CT of the cervical spine, there appear to be bilateral cervical ribs at the C7 level. Alignment: Cervical dextrocurvature. No significant spondylolisthesis. Vertebrae: There is mild anterior wedging of the T11 vertebral body. Additionally, there is a mild T12 superior endplate compression deformity (less than 20% height loss). There is a transversely oriented fracture traversing the T11 spinous process and bilateral T11 inferior articular processes. This is suggestive of a flexion/distraction injury mechanism, and this fracture may be unstable. A subtle nondisplaced fracture is also questioned through the right T12 superior articular process (series 504, image 38). Multilevel degenerative endplate irregularity with  Schmorl nodes. Paraspinal and other soft tissues: Please refer to the  concurrently performed CT chest/abdomen/pelvis for a description of intrathoracic and abdominopelvic soft tissue findings. Disc levels: Moderate disc space narrowing throughout the thoracic spine. No appreciable significant spinal canal stenosis. No high-grade bony neural foraminal narrowing. CT LUMBAR SPINE FINDINGS Segmentation: 5 lumbar vertebrae. The caudal most well-formed intervertebral disc space is designated L5-S1. Alignment: No significant spondylolisthesis. Vertebrae: Vertebral body height is maintained. No evidence of acute fracture to the lumbar spine. Multilevel degenerative endplate irregularity. Paraspinal and other soft tissues: The concurrently performed CT chest/abdomen/pelvis for description of abdominopelvic soft tissue findings. Disc levels: No more than mild disc space narrowing at any level. Lumbar spondylosis with level by level findings most notably as follows. L4-L5: Disc bulge with mild endplate spurring. Facet arthrosis and ligamentum flavum hypertrophy. Suspected moderate central canal stenosis with bilateral subarticular narrowing. Moderate right neural foraminal narrowing. IMPRESSION: CT THORACIC SPINE IMPRESSION 1. Please note cervical ribs are present bilaterally at the C7 level. 2. Acute transversely oriented fracture traversing the T11 spinous process and bilateral T11 inferior articular processes. Additionally, there is a mild T12 superior endplate compression deformity, likely acute. Mild anterior wedging of the T11 vertebra, also possibly acute. The constellation of findings is suggestive of a flexion/distraction injury, and this fracture may be unstable. A thoracic spine MRI is recommended to assess for any associated ligamentous injury, and to exclude spinal cord injury. A subtle nondisplaced fracture through the right T12 superior articular process is also questioned. 3. Thoracic spondylosis, as outlined.  No appreciable significant spinal canal stenosis. No high-grade bony neural foraminal narrowing. 4. Thoracic dextrocurvature. 5. Please refer to the concurrently performed CT chest/abdomen/pelvis for a description of intrathoracic and abdominopelvic soft tissue findings. CT LUMBAR SPINE IMPRESSION 1. No evidence of acute fracture to the lumbar spine. 2. Lumbar spondylosis, as described and greatest at L4-L5. At this level, there is suspected moderate central canal stenosis with bilateral subarticular narrowing, as well as moderate right neural foraminal narrowing. 3. Please refer to the concurrently performed CT chest/abdomen/pelvis for description of abdominopelvic soft tissue findings. Electronically Signed: By: Jackey Loge D.O. On: 07/09/2021 20:25   CT L-SPINE NO CHARGE  Addendum Date: 07/09/2021   ADDENDUM REPORT: 07/09/2021 21:12 ADDENDUM: CT thoracic spine impression #2 discussed with Dr. Silverio Lay by telephone at 8:55 p.m. on 07/09/2021. Electronically Signed   By: Jackey Loge D.O.   On: 07/09/2021 21:12   Result Date: 07/09/2021 CLINICAL DATA:  Trauma. EXAM: CT THORACIC AND LUMBAR SPINE WITHOUT CONTRAST TECHNIQUE: Multidetector CT imaging of the thoracic and lumbar spine was performed without contrast. Multiplanar CT image reconstructions were also generated. COMPARISON:  None. FINDINGS: CT THORACIC SPINE FINDINGS In correlating with the concurrently performed CT of the cervical spine, there appear to be bilateral cervical ribs at the C7 level. Alignment: Cervical dextrocurvature. No significant spondylolisthesis. Vertebrae: There is mild anterior wedging of the T11 vertebral body. Additionally, there is a mild T12 superior endplate compression deformity (less than 20% height loss). There is a transversely oriented fracture traversing the T11 spinous process and bilateral T11 inferior articular processes. This is suggestive of a flexion/distraction injury mechanism, and this fracture may be unstable. A  subtle nondisplaced fracture is also questioned through the right T12 superior articular process (series 504, image 38). Multilevel degenerative endplate irregularity with Schmorl nodes. Paraspinal and other soft tissues: Please refer to the concurrently performed CT chest/abdomen/pelvis for a description of intrathoracic and abdominopelvic soft tissue findings. Disc levels: Moderate disc space narrowing throughout the thoracic spine. No appreciable significant spinal  canal stenosis. No high-grade bony neural foraminal narrowing. CT LUMBAR SPINE FINDINGS Segmentation: 5 lumbar vertebrae. The caudal most well-formed intervertebral disc space is designated L5-S1. Alignment: No significant spondylolisthesis. Vertebrae: Vertebral body height is maintained. No evidence of acute fracture to the lumbar spine. Multilevel degenerative endplate irregularity. Paraspinal and other soft tissues: The concurrently performed CT chest/abdomen/pelvis for description of abdominopelvic soft tissue findings. Disc levels: No more than mild disc space narrowing at any level. Lumbar spondylosis with level by level findings most notably as follows. L4-L5: Disc bulge with mild endplate spurring. Facet arthrosis and ligamentum flavum hypertrophy. Suspected moderate central canal stenosis with bilateral subarticular narrowing. Moderate right neural foraminal narrowing. IMPRESSION: CT THORACIC SPINE IMPRESSION 1. Please note cervical ribs are present bilaterally at the C7 level. 2. Acute transversely oriented fracture traversing the T11 spinous process and bilateral T11 inferior articular processes. Additionally, there is a mild T12 superior endplate compression deformity, likely acute. Mild anterior wedging of the T11 vertebra, also possibly acute. The constellation of findings is suggestive of a flexion/distraction injury, and this fracture may be unstable. A thoracic spine MRI is recommended to assess for any associated ligamentous injury,  and to exclude spinal cord injury. A subtle nondisplaced fracture through the right T12 superior articular process is also questioned. 3. Thoracic spondylosis, as outlined. No appreciable significant spinal canal stenosis. No high-grade bony neural foraminal narrowing. 4. Thoracic dextrocurvature. 5. Please refer to the concurrently performed CT chest/abdomen/pelvis for a description of intrathoracic and abdominopelvic soft tissue findings. CT LUMBAR SPINE IMPRESSION 1. No evidence of acute fracture to the lumbar spine. 2. Lumbar spondylosis, as described and greatest at L4-L5. At this level, there is suspected moderate central canal stenosis with bilateral subarticular narrowing, as well as moderate right neural foraminal narrowing. 3. Please refer to the concurrently performed CT chest/abdomen/pelvis for description of abdominopelvic soft tissue findings. Electronically Signed: By: Jackey Loge D.O. On: 07/09/2021 20:25   DG Hand Complete Right  Result Date: 07/09/2021 CLINICAL DATA:  Fall, deformity EXAM: RIGHT WRIST - COMPLETE 3+ VIEW; RIGHT FOREARM - 2 VIEW; RIGHT HAND - COMPLETE 3+ VIEW COMPARISON:  None. FINDINGS: Forearm: The comminuted and displaced distal radial fracture is assessed below. No acute fracture of the proximal radius or ulna is identified. There is lucency through an olecranon enthesophyte which may reflect a remote fracture. There is no significant overlying soft tissue swelling to suggest acute fracture. Elbow alignment appears maintained. Wrist: There is a severely comminuted impacted fracture of the distal radius with intra-articular extension. There is mild dorsal displacement and angulation of the dominant distal fragment. There is a mildly displaced ulnar styloid fracture. Radiocarpal alignment is grossly intact. There is significant surrounding soft tissue swelling. Hand: No additional fracture is seen. Alignment of the bones of the hand is within normal limits. The joint spaces  are preserved. There is a punctate radiopaque foreign body within the soft tissues of the index finger overlying the radial aspect of the index finger proximal phalanx. IMPRESSION: 1. Severely comminuted and mildly displaced impacted fracture of the distal radius with intra-articular extension. 2. Mildly displaced ulnar styloid fracture. 3. Punctate radiopaque foreign body within the soft tissues along the radial aspect of the index finger proximal phalanx. 4. Probable remote fracture through an olecranon enthesophyte. Electronically Signed   By: Lesia Hausen M.D.   On: 07/09/2021 15:42    Review of Systems  All other systems reviewed and are negative.   Blood pressure 107/60, pulse (!) 58, temperature (!)  97 F (36.1 C), temperature source Temporal, resp. rate 14, height 5\' 11"  (1.803 m), weight 99.8 kg, SpO2 95 %. Physical Exam Constitutional:      General: He is in acute distress (looks very uncomfortable).     Appearance: Normal appearance. He is obese. He is not toxic-appearing.  HENT:     Head: Normocephalic.     Right Ear: External ear normal.     Left Ear: External ear normal.     Nose: No congestion or rhinorrhea.     Mouth/Throat:     Mouth: Mucous membranes are moist.  Eyes:     General: No scleral icterus.       Right eye: No discharge.        Left eye: No discharge.     Extraocular Movements: Extraocular movements intact.     Conjunctiva/sclera: Conjunctivae normal.     Pupils: Pupils are equal, round, and reactive to light.  Cardiovascular:     Rate and Rhythm: Regular rhythm. Bradycardia present.     Pulses: Normal pulses.     Heart sounds: No murmur heard.   No gallop.  Pulmonary:     Effort: Pulmonary effort is normal. No respiratory distress.     Breath sounds: Normal breath sounds. No stridor. No wheezing, rhonchi or rales.  Chest:     Chest wall: No tenderness.  Abdominal:     General: Abdomen is flat. There is no distension.     Palpations: There is no mass.      Tenderness: There is no abdominal tenderness. There is no guarding or rebound.  Musculoskeletal:        General: Tenderness, deformity (right wrist splinted) and signs of injury present.     Cervical back: Normal range of motion and neck supple. No rigidity or tenderness.     Comments: Back tenderness upon initial evaluation by Ed.  I did not roll him again.    Lymphadenopathy:     Cervical: No cervical adenopathy.  Skin:    General: Skin is warm and dry.     Capillary Refill: Capillary refill takes 2 to 3 seconds.     Coloration: Skin is not jaundiced or pale.     Findings: No bruising or erythema.  Neurological:     General: No focal deficit present.     Mental Status: He is alert and oriented to person, place, and time.     Cranial Nerves: No cranial nerve deficit.     Sensory: No sensory deficit.  Psychiatric:        Mood and Affect: Mood normal.        Behavior: Behavior normal.        Thought Content: Thought content normal.        Judgment: Judgment normal.     Assessment/Plan Fall Mediastinal hematoma Right wrist fx T12 compression fx T11 anterior wedging fx T11 bilateral inferior articular process fx T11 spinous process fx Hypokalemia  8 mm LLL lung nodule- future ct rec 6-12 months.  Seizure d/o  Admit for pain control Neurosurgery consult for spine fractures.  Dr. Silverio Lay will call NS.  Will see if any brace is needed.  The vertebral body fractures are not noted to be acute, but are adjacent to other acute fractures, so likely acute.   EKG given mediastinal hematoma.  Almond Lint 07/09/2021, 10:58 PM   Procedures

## 2021-07-09 NOTE — Progress Notes (Signed)
Orthopedic Tech Progress Note Patient Details:  Craig Church 30-Nov-1965 [id number]  Ortho Devices Type of Ortho Device: Sugartong splint Ortho Device/Splint Location: Right arm Ortho Device/Splint Interventions: Ordered, Application   Post Interventions Patient Tolerated: Fair Unable to wrap web space between thumb and index finger, but MD said it was fine.  Darleen Crocker 07/09/2021, 6:57 PM

## 2021-07-09 NOTE — ED Provider Notes (Addendum)
  Physical Exam  BP 107/60   Pulse (!) 58   Temp (!) 97 F (36.1 C) (Temporal)   Resp 14   Ht 5\' 11"  (1.803 m)   Wt 99.8 kg   SpO2 95%   BMI 30.68 kg/m   Physical Exam  ED Course/Procedures     Reduction of fracture  Date/Time: 07/09/2021 11:03 PM Performed by: 07/11/2021, MD Authorized by: Charlynne Pander, MD  Consent: Verbal consent obtained. Risks and benefits: risks, benefits and alternatives were discussed Required items: required blood products, implants, devices, and special equipment available Local anesthesia used: no  Anesthesia: Local anesthesia used: no  Sedation: Patient sedated: no  Patient tolerance: patient tolerated the procedure well with no immediate complications Comments: Use finger traps and weights. Reduced the fracture at bedside. Sugar tongue splint applied.     MDM  Care assumed at 3 pm. Patient fell off 7 foot ladder onto the back. Obvious R wrist deformity. Sign out pending wrist xrays and trauma scan    6 pm Xray showed R wrist impacted fracture. Consulted Dr. Charlynne Pander from hand. He recommend reducing at bedside and sugar tongue splint. He can see patient if he gets admitted or patient can follow up outpatient   7 pm I reduced fracture. Sugar tongue applied. Alignment improved on xray   9 pm Trauma scan showed T11 fracture. Also has mediatinal hematoma. Consulted Dr. Arita Miss from trauma who will admit. Talked to Dr. Donell Beers from neurosurgery. He recommend bedrest. He will see patient in AM   CRITICAL CARE Performed by: Franky Macho   Total critical care time:  30 minutes  Critical care time was exclusive of separately billable procedures and treating other patients.  Critical care was necessary to treat or prevent imminent or life-threatening deterioration.  Critical care was time spent personally by me on the following activities: development of treatment plan with patient and/or surrogate as well as nursing, discussions  with consultants, evaluation of patient's response to treatment, examination of patient, obtaining history from patient or surrogate, ordering and performing treatments and interventions, ordering and review of laboratory studies, ordering and review of radiographic studies, pulse oximetry and re-evaluation of patient's condition.     Richardean Canal, MD 07/09/21 07/11/21    [id number], MD 07/09/21 [phone]

## 2021-07-09 NOTE — ED Provider Notes (Signed)
[facility] EMERGENCY DEPARTMENT Provider Note   CSN: [id number] Arrival date & time: 07/09/21  1407     History Chief Complaint  Patient presents with   Craig Church is a 55 y.o. male.  HPI  55 year old male with past medical history of HTN, seizure disorder presents the emergency department by EMS after a fall from a ladder.  Patient states he was approximately 7 feet up when he lost his footing, fell straight back landing on to the ground on his back.  Did hit his head, no loss of consciousness, obvious deformity to the right hand/wrist.  Currently he is complaining of headache, thoracic and lumbar spine pain as well as right arm pain.  Does not take any anticoagulation besides aspirin.  Past Medical History:  Diagnosis Date   Hypertension    Seizure disorder (HCC)     There are no problems to display for this patient.   Past Surgical History:  Procedure Laterality Date   CHOLECYSTECTOMY         No family history on file.  Social History   Tobacco Use   Smoking status: Never   Smokeless tobacco: Current    Types: Chew  Substance Use Topics   Alcohol use: Never   Drug use: Never    Home Medications Prior to Admission medications   Not on File    Allergies    Patient has no allergy information on record.  Review of Systems   Review of Systems  HENT:  Negative for trouble swallowing and voice change.   Eyes:  Negative for visual disturbance.  Respiratory:  Negative for shortness of breath.   Cardiovascular:  Negative for chest pain.  Gastrointestinal:  Negative for abdominal pain.  Genitourinary:  Negative for difficulty urinating.  Musculoskeletal:  Positive for back pain. Negative for neck pain.       + Right wrist pain/deformity  Neurological:  Positive for headaches.  Psychiatric/Behavioral:  Negative for confusion.    Physical Exam Updated Vital Signs BP (!) 170/81 (BP Location: Left Arm)   Pulse 67   Temp (!) 97 F  (36.1 C) (Temporal)   Resp 20   Ht 5\' 11"  (1.803 m)   Wt 99.8 kg   SpO2 100%   BMI 30.68 kg/m   Physical Exam Vitals and nursing note reviewed.  HENT:     Head: Normocephalic.     Comments: Midface is stable    Right Ear: External ear normal.     Left Ear: External ear normal.     Nose: Nose normal.     Comments: No septal hematoma Eyes:     Conjunctiva/sclera: Conjunctivae normal.     Pupils: Pupils are equal, round, and reactive to light.  Neck:     Comments: Cervical collar in place Cardiovascular:     Rate and Rhythm: Normal rate.  Pulmonary:     Effort: Pulmonary effort is normal.     Breath sounds: Normal breath sounds.  Abdominal:     General: Abdomen is flat.     Palpations: Abdomen is soft.     Comments: No seat belt sign  Musculoskeletal:        General: Swelling, deformity and signs of injury present.     Comments: Pelvis is stable, obvious deformity of the right wrist, equal palpable radial pulses and cap refill, slightly decreased range of motion of the right hand finger secondary to pain  Skin:    General: Skin  is warm.  Neurological:     Mental Status: He is alert and oriented to person, place, and time.    ED Results / Procedures / Treatments   Labs (all labs ordered are listed, but only abnormal results are displayed) Labs Reviewed - No data to display  EKG None  Radiology No results found.  Procedures Procedures   Medications Ordered in ED Medications  sodium chloride 0.9 % bolus 500 mL (has no administration in time range)  morphine 4 MG/ML injection 4 mg (has no administration in time range)    ED Course  I have reviewed the triage vital signs and the nursing notes.  Pertinent labs & imaging results that were available during my care of the patient were reviewed by me and considered in my medical decision making (see chart for details).    MDM Rules/Calculators/A&P                           54 year old male presents emergency  department after a mechanical fall off of a ladder approximately 7 feet up.  Landed flat on his back.  Positive head injury, without loss of consciousness.  Currently complaining of headache, upper and lower back pain and right wrist pain/deformity.  Vitals are stable on arrival, denies any anticoagulation besides aspirin.  The right hand at this time appears neurovascularly intact.  Plan for CT and x-ray trauma imaging.  Patient signed out to oncoming provider.  Final Clinical Impression(s) / ED Diagnoses Final diagnoses:  Trauma    Rx / DC Orders ED Discharge Orders     None        Rozelle Logan, DO 07/09/21 1500

## 2021-07-09 NOTE — Progress Notes (Signed)
Chaplain engaged in an initial visit with Craig Church, Craig Church.  Chaplain provided her some food after she noted that she was hungry.  Craig Church also requested prayer.  Chaplain provided prayer to [name] over [name] after voicing that her husband had been through so much in his health lately.  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  She voiced that he has been going through a lot.  Chaplain offered presence, listening and support.    07/09/21 2000  Clinical Encounter Type  Visited With Family  Visit Type Initial;Spiritual support

## 2021-07-10 DIAGNOSIS — S52501A Unspecified fracture of the lower end of right radius, initial encounter for closed fracture: Secondary | ICD-10-CM

## 2021-07-10 LAB — CBC
HCT: 35.7 % — ABNORMAL LOW (ref 39.0–52.0)
HCT: 35.8 % — ABNORMAL LOW (ref 39.0–52.0)
Hemoglobin: 11.2 g/dL — ABNORMAL LOW (ref 13.0–17.0)
Hemoglobin: 11.7 g/dL — ABNORMAL LOW (ref 13.0–17.0)
MCH: 26.7 pg (ref 26.0–34.0)
MCH: 27.3 pg (ref 26.0–34.0)
MCHC: 31.4 g/dL (ref 30.0–36.0)
MCHC: 32.7 g/dL (ref 30.0–36.0)
MCV: 85 fL (ref 80.0–100.0)
MCV: 83.6 fL (ref 80.0–100.0)
Platelets: 261 10*3/uL (ref 150–400)
Platelets: 277 10*3/uL (ref 150–400)
RBC: 4.2 MIL/uL — ABNORMAL LOW (ref 4.22–5.81)
RBC: 4.28 MIL/uL (ref 4.22–5.81)
RDW: 13.5 % (ref 11.5–15.5)
RDW: 13.4 % (ref 11.5–15.5)
WBC: 8 10*3/uL (ref 4.0–10.5)
WBC: 8.2 10*3/uL (ref 4.0–10.5)
nRBC: 0 % (ref 0.0–0.2)
nRBC: 0 % (ref 0.0–0.2)

## 2021-07-10 LAB — BASIC METABOLIC PANEL
Anion gap: 7 (ref 5–15)
BUN: 15 mg/dL (ref 6–20)
CO2: 21 mmol/L — ABNORMAL LOW (ref 22–32)
Calcium: 8.6 mg/dL — ABNORMAL LOW (ref 8.9–10.3)
Chloride: 109 mmol/L (ref 98–111)
Creatinine, Ser: 0.88 mg/dL (ref 0.61–1.24)
GFR, Estimated: >60 mL/min (ref >60)
Glucose, Bld: 105 mg/dL — ABNORMAL HIGH (ref 70–99)
Potassium: 3.8 mmol/L (ref 3.5–5.1)
Sodium: 137 mmol/L (ref 135–145)

## 2021-07-10 LAB — CREATININE, SERUM
Creatinine, Ser: 0.85 mg/dL (ref 0.61–1.24)
GFR, Estimated: >60 mL/min (ref >60)

## 2021-07-10 LAB — SARS CORONAVIRUS 2 (TAT 6-24 HRS): SARS Coronavirus 2: NEGATIVE

## 2021-07-10 LAB — HIV ANTIBODY (ROUTINE TESTING W REFLEX): HIV Screen 4th Generation wRfx: NONREACTIVE

## 2021-07-10 MED ORDER — SODIUM CHLORIDE 0.9 % IV SOLN: 250.0000 mL | INTRAVENOUS | Status: DC | PRN

## 2021-07-10 MED ORDER — OXYCODONE HCL 5 MG PO TABS
5.0000 mg | ORAL_TABLET | ORAL | Status: DC | PRN
  Administered 2021-07-10 – 2021-07-18 (×27): 10 mg via ORAL
  Administered 2021-07-19: 5 mg via ORAL
  Administered 2021-07-19 – 2021-07-20 (×3): 10 mg via ORAL
  Filled 2021-07-10 (×24): qty 2
  Filled 2021-07-10: qty 1
  Filled 2021-07-10 (×6): qty 2

## 2021-07-10 MED ORDER — KETOROLAC TROMETHAMINE 15 MG/ML IJ SOLN
30.0000 mg | Freq: Four times a day (QID) | INTRAMUSCULAR | Status: AC
  Administered 2021-07-10 – 2021-07-15 (×19): 30 mg via INTRAVENOUS
  Filled 2021-07-10 (×20): qty 2

## 2021-07-10 MED ORDER — MORPHINE SULFATE (PF) 2 MG/ML IV SOLN
2.0000 mg | INTRAVENOUS | Status: DC | PRN
Start: 2021-07-10 — End: 2021-07-12
  Administered 2021-07-10 – 2021-07-11 (×3): 2 mg via INTRAVENOUS
  Filled 2021-07-10 (×4): qty 1

## 2021-07-10 MED ORDER — METHOCARBAMOL 500 MG PO TABS
1000.0000 mg | ORAL_TABLET | Freq: Three times a day (TID) | ORAL | Status: DC
  Administered 2021-07-10 – 2021-07-20 (×29): 1000 mg via ORAL
  Filled 2021-07-10 (×29): qty 2

## 2021-07-10 MED ORDER — ACETAMINOPHEN 500 MG PO TABS
1000.0000 mg | ORAL_TABLET | Freq: Four times a day (QID) | ORAL | Status: DC
  Administered 2021-07-10 – 2021-07-20 (×36): 1000 mg via ORAL
  Filled 2021-07-10 (×37): qty 2

## 2021-07-10 NOTE — Consult Note (Signed)
Reason for Consult/CC: Right wrist fracture  Craig Church is an 55 y.o. male.  HPI: Patient presented to the emergency room yesterday after a fall from a ladder.  He sustained a right distal radius fracture and thoracic spine fractures.  He was reduced and splinted in the emergency room.  He is in the hospital now for neurosurgical evaluation and pain control.  Allergies:  Allergies  Allergen Reactions   Penicillins Other (See Comments)    Unknown childhood reaction    Medications:  Current Facility-Administered Medications:    0.9 %  sodium chloride infusion, 250 mL, Intravenous, PRN, Manus Rudd, MD   acetaminophen (TYLENOL) tablet 1,000 mg, 1,000 mg, Oral, Q6H, Lovick, Lennie Odor, MD, 1,000 mg at 07/10/21 1129   aspirin EC tablet 81 mg, 81 mg, Oral, QHS, Almond Lint, MD, 81 mg at 07/10/21 0152   atorvastatin (LIPITOR) tablet 10 mg, 10 mg, Oral, Q48H, Almond Lint, MD, 10 mg at 07/10/21 0152   diclofenac Sodium (VOLTAREN) 1 % topical gel 1 application, 1 application, Topical, Daily PRN, Almond Lint, MD   docusate sodium (COLACE) capsule 100 mg, 100 mg, Oral, BID, Almond Lint, MD, 100 mg at 07/10/21 1131   [START ON 07/11/2021] enoxaparin (LOVENOX) injection 30 mg, 30 mg, Subcutaneous, Q12H, Almond Lint, MD   gabapentin (NEURONTIN) capsule 800 mg, 800 mg, Oral, BID, Almond Lint, MD, 800 mg at 07/10/21 1129   hydrochlorothiazide (MICROZIDE) capsule 12.5 mg, 12.5 mg, Oral, Daily, Cathie Hoops, RPH, 12.5 mg at 07/10/21 1130   ketorolac (TORADOL) 15 MG/ML injection 30 mg, 30 mg, Intravenous, Q6H, Lovick, Lennie Odor, MD, 30 mg at 07/10/21 1125   LORazepam (ATIVAN) injection 1 mg, 1 mg, Intravenous, Once, Charlynne Pander, MD   losartan (COZAAR) tablet 50 mg, 50 mg, Oral, Daily, Cathie Hoops, RPH   melatonin tablet 3 mg, 3 mg, Oral, QHS PRN, Almond Lint, MD   methocarbamol (ROBAXIN) tablet 1,000 mg, 1,000 mg, Oral, Q8H, Lovick, Lennie Odor, MD   morphine 2 MG/ML injection  2-4 mg, 2-4 mg, Intravenous, Q2H PRN, Manus Rudd, MD   ondansetron (ZOFRAN-ODT) disintegrating tablet 4 mg, 4 mg, Oral, Q6H PRN **OR** ondansetron (ZOFRAN) injection 4 mg, 4 mg, Intravenous, Q6H PRN, Almond Lint, MD, 4 mg at 07/10/21 0152   oxyCODONE (Oxy IR/ROXICODONE) immediate release tablet 5-10 mg, 5-10 mg, Oral, Q4H PRN, Diamantina Monks, MD, 10 mg at 07/10/21 1241   pantoprazole (PROTONIX) EC tablet 40 mg, 40 mg, Oral, Daily, Almond Lint, MD, 40 mg at 07/10/21 1130   rOPINIRole (REQUIP) tablet 1 mg, 1 mg, Oral, BID, Almond Lint, MD, 1 mg at 07/10/21 1131   sodium chloride flush (NS) 0.9 % injection 3 mL, 3 mL, Intravenous, Q12H, Almond Lint, MD, 3 mL at 07/10/21 1133   sodium chloride flush (NS) 0.9 % injection 3 mL, 3 mL, Intravenous, PRN, Almond Lint, MD   topiramate (TOPAMAX) tablet 100 mg, 100 mg, Oral, BID, Almond Lint, MD, 100 mg at 07/10/21 1134   venlafaxine XR (EFFEXOR-XR) 24 hr capsule 75 mg, 75 mg, Oral, q morning, Almond Lint, MD  Past Medical History:  Diagnosis Date   Hypertension    Seizure disorder [facility])     Past Surgical History:  Procedure Laterality Date   CHOLECYSTECTOMY      No family history on file.  Social History:  reports that he has never smoked. His smokeless tobacco use includes chew. He reports that he does not drink alcohol and does not use drugs.  Physical Exam Blood pressure 121/70, pulse 61, temperature 98 F (36.7 C), resp. rate 18, height  (1.803 m), weight 99.8 kg, SpO2 100 %. General: Mild distress due to pain Right hand: Fingers well-perfused.  He can flex and extend his fingers and thumb.  All fingers and thumb have what appear to be normal sensation and normal capillary refill.  He is in a sugar-tong splint.  Results for orders placed or performed during the hospital encounter of 07/09/21 (from the past 48 hour(s))  CBC with Differential     Status: Abnormal   Collection Time: 07/09/21  4:30 PM  Result Value Ref  Range   WBC 12.0 (H) 4.0 - 10.5 K/uL   RBC 4.29 4.22 - 5.81 MIL/uL   Hemoglobin 11.5 (L) 13.0 - 17.0 g/dL   HCT 16.1 (L) 09.6 - 04.5 %   MCV 83.9 80.0 - 100.0 fL   MCH 26.8 26.0 - 34.0 pg   MCHC 31.9 30.0 - 36.0 g/dL   RDW 40.9 81.1 - 91.4 %   Platelets 294 150 - 400 K/uL   nRBC 0.0 0.0 - 0.2 %   Neutrophils Relative % 85 %   Neutro Abs 10.3 (H) 1.7 - 7.7 K/uL   Lymphocytes Relative 8 %   Lymphs Abs 0.9 0.7 - 4.0 K/uL   Monocytes Relative 5 %   Monocytes Absolute 0.6 0.1 - 1.0 K/uL   Eosinophils Relative 1 %   Eosinophils Absolute 0.1 0.0 - 0.5 K/uL   Basophils Relative 0 %   Basophils Absolute 0.0 0.0 - 0.1 K/uL   Immature Granulocytes 1 %   Abs Immature Granulocytes 0.09 (H) 0.00 - 0.07 K/uL    Comment: Performed at [facility], [address], [place], [place] [address]  Basic metabolic panel     Status: Abnormal   Collection Time: 07/09/21  4:30 PM  Result Value Ref Range   Sodium 138 135 - 145 mmol/L   Potassium 3.3 (L) 3.5 - 5.1 mmol/L   Chloride 109 98 - 111 mmol/L   CO2 21 (L) 22 - 32 mmol/L   Glucose, Bld 98 70 - 99 mg/dL    Comment: Glucose reference range applies only to samples taken after fasting for at least 8 hours.   BUN 20 6 - 20 mg/dL   Creatinine, Ser 6.21 0.61 - 1.24 mg/dL   Calcium 9.1 8.9 - 30.8 mg/dL   GFR, Estimated >65 >78 mL/min    Comment: (NOTE) Calculated using the CKD-EPI Creatinine Equation (2021)    Anion gap 8 5 - 15    Comment: Performed at [facility], [address], [place], [place] [address]  Protime-INR     Status: None   Collection Time: 07/09/21  4:30 PM  Result Value Ref Range   Prothrombin Time 12.4 11.4 - 15.2 seconds   INR 0.9 0.8 - 1.2    Comment: (NOTE) INR goal varies based on device and disease states. Performed at [facility], [address] [place], [place] [address]   SARS CORONAVIRUS 2 (TAT 6-24 HRS) Nasopharyngeal Nasopharyngeal Swab     Status: None   Collection Time: 07/10/21  3:51 AM    Specimen: Nasopharyngeal Swab  Result Value Ref Range   SARS Coronavirus 2 NEGATIVE NEGATIVE    Comment: (NOTE) SARS-CoV-2 target nucleic acids are NOT DETECTED.  The SARS-CoV-2 RNA is generally detectable in upper and lower respiratory specimens during the acute phase of infection. Negative results  do not preclude SARS-CoV-2 infection, do not rule out co-infections with other pathogens, and should not be used as the sole basis for treatment or other patient management decisions. Negative results must be combined with clinical observations, patient history, and epidemiological information. The expected result is Negative.  Fact Sheet for Patients: HairSlick.no  Fact Sheet for Healthcare Providers: quierodirigir.com  This test is not yet approved or cleared by the [place] FDA and  has been authorized for detection and/or diagnosis of SARS-CoV-2 by FDA under an Emergency Use Authorization (EUA). This EUA will remain  in effect (meaning this test can be used) for the duration of the COVID-19 declaration under Se ction 564(b)(1) of the Act, 21 U.S.C. section 360bbb-3(b)(1), unless the authorization is terminated or revoked sooner.  Performed at [facility], [address], [place], [place] [address]   CBC     Status: Abnormal   Collection Time: 07/10/21  4:18 AM  Result Value Ref Range   WBC 8.0 4.0 - 10.5 K/uL   RBC 4.20 (L) 4.22 - 5.81 MIL/uL   Hemoglobin 11.2 (L) 13.0 - 17.0 g/dL   HCT 60.4 (L) 54.0 - 98.1 %   MCV 85.0 80.0 - 100.0 fL   MCH 26.7 26.0 - 34.0 pg   MCHC 31.4 30.0 - 36.0 g/dL   RDW 19.1 47.8 - 29.5 %   Platelets 261 150 - 400 K/uL   nRBC 0.0 0.0 - 0.2 %    Comment: Performed at [facility], [address] [place], [place] [address]  Basic metabolic panel     Status: Abnormal   Collection Time: 07/10/21  4:18 AM  Result Value Ref Range   Sodium 137 135 - 145 mmol/L   Potassium 3.8  3.5 - 5.1 mmol/L   Chloride 109 98 - 111 mmol/L   CO2 21 (L) 22 - 32 mmol/L   Glucose, Bld 105 (H) 70 - 99 mg/dL    Comment: Glucose reference range applies only to samples taken after fasting for at least 8 hours.   BUN 15 6 - 20 mg/dL   Creatinine, Ser 8.65 0.61 - 1.24 mg/dL   Calcium 8.6 (L) 8.9 - 10.3 mg/dL   GFR, Estimated >78 >46 mL/min    Comment: (NOTE) Calculated using the CKD-EPI Creatinine Equation (2021)    Anion gap 7 5 - 15    Comment: Performed at [facility], [address], [place], [place] [address]  HIV Antibody (routine testing w rflx)     Status: None   Collection Time: 07/10/21  5:44 AM  Result Value Ref Range   HIV Screen 4th Generation wRfx Non Reactive Non Reactive    Comment: Performed at [facility], [address] [place], [place] [address]  CBC     Status: Abnormal   Collection Time: 07/10/21  5:44 AM  Result Value Ref Range   WBC 8.2 4.0 - 10.5 K/uL   RBC 4.28 4.22 - 5.81 MIL/uL   Hemoglobin 11.7 (L) 13.0 - 17.0 g/dL   HCT 24.4 (L) 01.0 - 27.2 %   MCV 83.6 80.0 - 100.0 fL   MCH 27.3 26.0 - 34.0 pg   MCHC 32.7 30.0 - 36.0 g/dL   RDW 53.6 64.4 - 03.4 %   Platelets 277 150 - 400 K/uL   nRBC 0.0 0.0 - 0.2 %    Comment: Performed at [facility], [address], [place], [place] [address]  Creatinine, serum     Status: None   Collection Time: 07/10/21  5:44 AM  Result Value Ref Range   Creatinine, Ser 0.85 0.61 - 1.24 mg/dL   GFR, Estimated >16 >10 mL/min    Comment: (NOTE) Calculated using the CKD-EPI Creatinine Equation (2021) Performed at [facility], [address] [place], [place] [address]     DG Forearm Right  Result Date: 07/09/2021 CLINICAL DATA:  Fall, deformity EXAM: RIGHT WRIST - COMPLETE 3+ VIEW; RIGHT FOREARM - 2 VIEW; RIGHT HAND - COMPLETE 3+ VIEW COMPARISON:  None. FINDINGS: Forearm: The comminuted and displaced distal radial fracture is assessed below. No acute fracture of the proximal radius or ulna  is identified. There is lucency through an olecranon enthesophyte which may reflect a remote fracture. There is no significant overlying soft tissue swelling to suggest acute fracture. Elbow alignment appears maintained. Wrist: There is a severely comminuted impacted fracture of the distal radius with intra-articular extension. There is mild dorsal displacement and angulation of the dominant distal fragment. There is a mildly displaced ulnar styloid fracture. Radiocarpal alignment is grossly intact. There is significant surrounding soft tissue swelling. Hand: No additional fracture is seen. Alignment of the bones of the hand is within normal limits. The joint spaces are preserved. There is a punctate radiopaque foreign body within the soft tissues of the index finger overlying the radial aspect of the index finger proximal phalanx. IMPRESSION: 1. Severely comminuted and mildly displaced impacted fracture of the distal radius with intra-articular extension. 2. Mildly displaced ulnar styloid fracture. 3. Punctate radiopaque foreign body within the soft tissues along the radial aspect of the index finger proximal phalanx. 4. Probable remote fracture through an olecranon enthesophyte. Electronically Signed   By: Lesia Hausen M.D.   On: 07/09/2021 15:42   DG Wrist Complete Right  Result Date: 07/09/2021 CLINICAL DATA:  Right wrist reduction. EXAM: RIGHT WRIST - COMPLETE 3+ VIEW COMPARISON:  Pre reduction radiograph earlier today FINDINGS: Frontal and lateral views of the right wrist obtained with overlying splint material in place. Improved alignment of comminuted and displaced distal radius fracture with mild residual radial displacement. Unchanged alignment of ulnar styloid fracture. IMPRESSION: Improved alignment of comminuted distal radius fracture postreduction with mild residual radial displacement. Unchanged ulnar styloid fracture. Electronically Signed   By: Narda Rutherford M.D.   On: 07/09/2021 20:15   DG  Wrist Complete Right  Result Date: 07/09/2021 CLINICAL DATA:  Fall, deformity EXAM: RIGHT WRIST - COMPLETE 3+ VIEW; RIGHT FOREARM - 2 VIEW; RIGHT HAND - COMPLETE 3+ VIEW COMPARISON:  None. FINDINGS: Forearm: The comminuted and displaced distal radial fracture is assessed below. No acute fracture of the proximal radius or ulna is identified. There is lucency through an olecranon enthesophyte which may reflect a remote fracture. There is no significant overlying soft tissue swelling to suggest acute fracture. Elbow alignment appears maintained. Wrist: There is a severely comminuted impacted fracture of the distal radius with intra-articular extension. There is mild dorsal displacement and angulation of the dominant distal fragment. There is a mildly displaced ulnar styloid fracture. Radiocarpal alignment is grossly intact. There is significant surrounding soft tissue swelling. Hand: No additional fracture is seen. Alignment of the bones of the hand is within normal limits. The joint spaces are preserved. There is a punctate radiopaque foreign body within the soft tissues of the index finger overlying the radial aspect of the index finger proximal phalanx. IMPRESSION: 1. Severely comminuted and mildly displaced impacted fracture of  the distal radius with intra-articular extension. 2. Mildly displaced ulnar styloid fracture. 3. Punctate radiopaque foreign body within the soft tissues along the radial aspect of the index finger proximal phalanx. 4. Probable remote fracture through an olecranon enthesophyte. Electronically Signed   By: Lesia Hausen M.D.   On: 07/09/2021 15:42   CT Head Wo Contrast  Result Date: 07/09/2021 CLINICAL DATA:  Trauma, low back pain. EXAM: CT HEAD WITHOUT CONTRAST CT CERVICAL SPINE WITHOUT CONTRAST CT CHEST, ABDOMEN AND PELVIS WITH CONTRAST TECHNIQUE: Contiguous axial images were obtained from the base of the skull through the vertex without intravenous contrast. Multidetector CT imaging  of the cervical spine was performed without intravenous contrast. Multiplanar CT image reconstructions were also generated. Multidetector CT imaging of the chest, abdomen and pelvis was performed following the standard protocol during bolus administration of intravenous contrast. CONTRAST:  OMNIPAQUE IOHEXOL 350 MG/ML SOLN COMPARISON:  None. FINDINGS: CT HEAD FINDINGS Brain: No evidence of large-territorial acute infarction. No parenchymal hemorrhage. 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 No extra-axial collection. No mass effect or midline shift. No hydrocephalus. Basilar cisterns are patent. Partial empty sella. Vascular: No hyperdense vessel. Skull: No acute fracture or focal lesion. Sinuses/Orbits: Paranasal sinuses and mastoid air cells are clear. The orbits are unremarkable. Other: None. CT CERVICAL FINDINGS Alignment: Normal. Skull base and vertebrae: Partial fusion of the C4-C5 levels. Multilevel degenerative changes of the spine. 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 No severe osseous central canal stenosis. No acute fracture. No aggressive appearing focal osseous lesion or focal pathologic process. Soft tissues and spinal canal: No prevertebral fluid or swelling. No visible canal hematoma. Upper chest: Unremarkable. Other: None. CT CHEST FINDINGS Ports and Devices: None. Lungs/airways: Bilateral lower lobe subsegmental atelectasis. No focal consolidation. There is a difficult to measure slightly spiculated 1.1 x 0.5 cm right lower lobe subpleural nodule (5:87, 7:108). Associated mild tethering of the pleura is noted. Few scattered calcified pulmonary micronodules within the left lung. No pulmonary mass. No pulmonary contusion or laceration. No pneumatocele formation. The central airways are patent. Pleura: No pleural effusion. No pneumothorax. No hemothorax. Lymph Nodes: No mediastinal,  hilar, or axillary lymphadenopathy. Mediastinum: No pneumomediastinum. Trace high density free fluid within the superior anterior mediastinum (3:21) just posterior to the manubrium. Normal fat plane between this fluid and the aorta is noted. No aortic injury. The thoracic aorta is normal in caliber. The heart is normal in size. No significant pericardial effusion. The esophagus is unremarkable. The thyroid is unremarkable. Chest Wall / Breasts: No chest wall mass. Musculoskeletal: No acute rib or sternal fracture. Please see separately dictated CT thoracic spine 07/09/2021. partially visualized right upper extremity with a partially visualized comminuted radial fracture. CT ABDOMEN AND PELVIS FINDINGS Liver: Not enlarged. No focal lesion. No laceration or subcapsular hematoma. Biliary System: Status post cholecystectomy. No biliary ductal dilatation. Pancreas: Normal pancreatic contour. No main pancreatic duct dilatation. Spleen: Not enlarged. No focal lesion. No laceration, subcapsular hematoma, or vascular injury. Adrenal Glands: No nodularity bilaterally. Kidneys: Bilateral kidneys enhance symmetrically. No hydronephrosis. No contusion, laceration, or subcapsular hematoma. No injury to the vascular structures or collecting systems. No hydroureter. The urinary bladder is unremarkable. On delayed imaging, there is no urothelial wall thickening and there are no filling defects in the opacified portions of the bilateral collecting systems or ureters. Bowel: No small or large bowel wall thickening or dilatation. Question appendiceal stump  with no findings of right lower quadrant inflammatory change Mesentery, Omentum, and Peritoneum: No simple free fluid ascites. No pneumoperitoneum. No hemoperitoneum. No mesenteric hematoma identified. No organized fluid collection. Pelvic Organs: Normal. Lymph Nodes: No abdominal, pelvic, inguinal lymphadenopathy. Vasculature: No abdominal aorta or iliac aneurysm. No active contrast  extravasation or pseudoaneurysm. Musculoskeletal: No significant soft tissue hematoma. Tiny fat containing umbilical hernia. No acute pelvic fracture. Please see separately dictated CT lumbar spine 07/09/2021. IMPRESSION: 1. No acute intracranial abnormality. 2. No acute displaced fracture or traumatic listhesis of the cervical spine. 3. Trace volume supero-anterior mediastinal hematoma posterior to the manubrium of unclear etiology. No associated aortic injury or findings to suggest aortic injury. No associated sternal/manubrial or costochondral junction fractures. 4. Otherwise no acute traumatic injury to the chest, abdomen, or pelvis. 5. Please see separately dictated CT thoracic and lumbar spine 07/09/2021. 6. Partially visualized distal comminuted right radial fracture. Other imaging findings of potential clinical significance: 1. 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 2. Partial empty sella. Findings is often a normal anatomic variant but can be associated with idiopathic intracranial hypertension (pseudotumor cerebri). 3. 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 4. A 0.8 cm left lower lobe pulmonary nodule. Non-contrast chest CT at 6-12 months is recommended. If the nodule is stable at time of repeat CT, then future CT at 18-24 months (from today's scan) is considered optional for low-risk patients, but is recommended for high-risk patients. This recommendation follows the consensus statement: Guidelines for Management of Incidental Pulmonary Nodules Detected on CT Images: From the Fleischner Society 2017; Radiology 2017; 284:228-243. Electronically Signed   By: Tish Frederickson M.D.   On: 07/09/2021 20:40   CT Cervical Spine Wo Contrast  Result Date: 07/09/2021 CLINICAL DATA:  Trauma, low back pain. EXAM: CT HEAD WITHOUT CONTRAST CT CERVICAL SPINE WITHOUT CONTRAST CT CHEST, ABDOMEN AND PELVIS WITH CONTRAST TECHNIQUE: Contiguous axial  images were obtained from the base of the skull through the vertex without intravenous contrast. Multidetector CT imaging of the cervical spine was performed without intravenous contrast. Multiplanar CT image reconstructions were also generated. Multidetector CT imaging of the chest, abdomen and pelvis was performed following the standard protocol during bolus administration of intravenous contrast. CONTRAST:  OMNIPAQUE IOHEXOL 350 MG/ML SOLN COMPARISON:  None. FINDINGS: CT HEAD FINDINGS Brain: No evidence of large-territorial acute infarction. No parenchymal hemorrhage. 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 No extra-axial collection. No mass effect or midline shift. No hydrocephalus. Basilar cisterns are patent. Partial empty sella. Vascular: No hyperdense vessel. Skull: No acute fracture or focal lesion. Sinuses/Orbits: Paranasal sinuses and mastoid air cells are clear. The orbits are unremarkable. Other: None. CT CERVICAL FINDINGS Alignment: Normal. Skull base and vertebrae: Partial fusion of the C4-C5 levels. Multilevel degenerative changes of the spine. 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 No severe osseous central canal stenosis. No acute fracture. No aggressive appearing focal osseous lesion or focal pathologic process. Soft tissues and spinal canal: No prevertebral fluid or swelling. No visible canal hematoma. Upper chest: Unremarkable. Other: None. CT CHEST FINDINGS Ports and Devices: None. Lungs/airways: Bilateral lower lobe subsegmental atelectasis. No focal consolidation. There is a difficult to measure slightly spiculated 1.1 x 0.5 cm right lower lobe subpleural nodule (5:87, 7:108). Associated mild tethering of the pleura is noted. Few scattered calcified pulmonary micronodules within the left lung.  No pulmonary mass. No pulmonary contusion or laceration. No pneumatocele formation. The  central airways are patent. Pleura: No pleural effusion. No pneumothorax. No hemothorax. Lymph Nodes: No mediastinal, hilar, or axillary lymphadenopathy. Mediastinum: No pneumomediastinum. Trace high density free fluid within the superior anterior mediastinum (3:21) just posterior to the manubrium. Normal fat plane between this fluid and the aorta is noted. No aortic injury. The thoracic aorta is normal in caliber. The heart is normal in size. No significant pericardial effusion. The esophagus is unremarkable. The thyroid is unremarkable. Chest Wall / Breasts: No chest wall mass. Musculoskeletal: No acute rib or sternal fracture. Please see separately dictated CT thoracic spine 07/09/2021. partially visualized right upper extremity with a partially visualized comminuted radial fracture. CT ABDOMEN AND PELVIS FINDINGS Liver: Not enlarged. No focal lesion. No laceration or subcapsular hematoma. Biliary System: Status post cholecystectomy. No biliary ductal dilatation. Pancreas: Normal pancreatic contour. No main pancreatic duct dilatation. Spleen: Not enlarged. No focal lesion. No laceration, subcapsular hematoma, or vascular injury. Adrenal Glands: No nodularity bilaterally. Kidneys: Bilateral kidneys enhance symmetrically. No hydronephrosis. No contusion, laceration, or subcapsular hematoma. No injury to the vascular structures or collecting systems. No hydroureter. The urinary bladder is unremarkable. On delayed imaging, there is no urothelial wall thickening and there are no filling defects in the opacified portions of the bilateral collecting systems or ureters. Bowel: No small or large bowel wall thickening or dilatation. Question appendiceal stump with no findings of right lower quadrant inflammatory change Mesentery, Omentum, and Peritoneum: No simple free fluid ascites. No pneumoperitoneum. No hemoperitoneum. No mesenteric hematoma identified. No organized fluid collection. Pelvic Organs: Normal. Lymph Nodes:  No abdominal, pelvic, inguinal lymphadenopathy. Vasculature: No abdominal aorta or iliac aneurysm. No active contrast extravasation or pseudoaneurysm. Musculoskeletal: No significant soft tissue hematoma. Tiny fat containing umbilical hernia. No acute pelvic fracture. Please see separately dictated CT lumbar spine 07/09/2021. IMPRESSION: 1. No acute intracranial abnormality. 2. No acute displaced fracture or traumatic listhesis of the cervical spine. 3. Trace volume supero-anterior mediastinal hematoma posterior to the manubrium of unclear etiology. No associated aortic injury or findings to suggest aortic injury. No associated sternal/manubrial or costochondral junction fractures. 4. Otherwise no acute traumatic injury to the chest, abdomen, or pelvis. 5. Please see separately dictated CT thoracic and lumbar spine 07/09/2021. 6. Partially visualized distal comminuted right radial fracture. Other imaging findings of potential clinical significance: 1. 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 2. Partial empty sella. Findings is often a normal anatomic variant but can be associated with idiopathic intracranial hypertension (pseudotumor cerebri). 3. 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 4. A 0.8 cm left lower lobe pulmonary nodule. Non-contrast chest CT at 6-12 months is recommended. If the nodule is stable at time of repeat CT, then future CT at 18-24 months (from today's scan) is considered optional for low-risk patients, but is recommended for high-risk patients. This recommendation follows the consensus statement: Guidelines for Management of Incidental Pulmonary Nodules Detected on CT Images: From the Fleischner Society 2017; Radiology 2017; 284:228-243. Electronically Signed   By: Tish Frederickson M.D.   On: 07/09/2021 20:40   CT CHEST ABDOMEN PELVIS W CONTRAST  Result Date: 07/09/2021 CLINICAL DATA:  Trauma, low back pain. EXAM: CT HEAD  WITHOUT CONTRAST CT CERVICAL SPINE WITHOUT CONTRAST CT CHEST, ABDOMEN AND PELVIS WITH CONTRAST TECHNIQUE: Contiguous axial images were obtained from the base of the skull through the vertex without intravenous contrast. Multidetector  CT imaging of the cervical spine was performed without intravenous contrast. Multiplanar CT image reconstructions were also generated. Multidetector CT imaging of the chest, abdomen and pelvis was performed following the standard protocol during bolus administration of intravenous contrast. CONTRAST:  OMNIPAQUE IOHEXOL 350 MG/ML SOLN COMPARISON:  None. FINDINGS: CT HEAD FINDINGS Brain: No evidence of large-territorial acute infarction. No parenchymal hemorrhage. 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 No extra-axial collection. No mass effect or midline shift. No hydrocephalus. Basilar cisterns are patent. Partial empty sella. Vascular: No hyperdense vessel. Skull: No acute fracture or focal lesion. Sinuses/Orbits: Paranasal sinuses and mastoid air cells are clear. The orbits are unremarkable. Other: None. CT CERVICAL FINDINGS Alignment: Normal. Skull base and vertebrae: Partial fusion of the C4-C5 levels. Multilevel degenerative changes of the spine. 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 No severe osseous central canal stenosis. No acute fracture. No aggressive appearing focal osseous lesion or focal pathologic process. Soft tissues and spinal canal: No prevertebral fluid or swelling. No visible canal hematoma. Upper chest: Unremarkable. Other: None. CT CHEST FINDINGS Ports and Devices: None. Lungs/airways: Bilateral lower lobe subsegmental atelectasis. No focal consolidation. There is a difficult to measure slightly spiculated 1.1 x 0.5 cm right lower lobe subpleural nodule (5:87, 7:108). Associated mild tethering of the pleura is noted. Few scattered calcified pulmonary  micronodules within the left lung. No pulmonary mass. No pulmonary contusion or laceration. No pneumatocele formation. The central airways are patent. Pleura: No pleural effusion. No pneumothorax. No hemothorax. Lymph Nodes: No mediastinal, hilar, or axillary lymphadenopathy. Mediastinum: No pneumomediastinum. Trace high density free fluid within the superior anterior mediastinum (3:21) just posterior to the manubrium. Normal fat plane between this fluid and the aorta is noted. No aortic injury. The thoracic aorta is normal in caliber. The heart is normal in size. No significant pericardial effusion. The esophagus is unremarkable. The thyroid is unremarkable. Chest Wall / Breasts: No chest wall mass. Musculoskeletal: No acute rib or sternal fracture. Please see separately dictated CT thoracic spine 07/09/2021. partially visualized right upper extremity with a partially visualized comminuted radial fracture. CT ABDOMEN AND PELVIS FINDINGS Liver: Not enlarged. No focal lesion. No laceration or subcapsular hematoma. Biliary System: Status post cholecystectomy. No biliary ductal dilatation. Pancreas: Normal pancreatic contour. No main pancreatic duct dilatation. Spleen: Not enlarged. No focal lesion. No laceration, subcapsular hematoma, or vascular injury. Adrenal Glands: No nodularity bilaterally. Kidneys: Bilateral kidneys enhance symmetrically. No hydronephrosis. No contusion, laceration, or subcapsular hematoma. No injury to the vascular structures or collecting systems. No hydroureter. The urinary bladder is unremarkable. On delayed imaging, there is no urothelial wall thickening and there are no filling defects in the opacified portions of the bilateral collecting systems or ureters. Bowel: No small or large bowel wall thickening or dilatation. Question appendiceal stump with no findings of right lower quadrant inflammatory change Mesentery, Omentum, and Peritoneum: No simple free fluid ascites. No  pneumoperitoneum. No hemoperitoneum. No mesenteric hematoma identified. No organized fluid collection. Pelvic Organs: Normal. Lymph Nodes: No abdominal, pelvic, inguinal lymphadenopathy. Vasculature: No abdominal aorta or iliac aneurysm. No active contrast extravasation or pseudoaneurysm. Musculoskeletal: No significant soft tissue hematoma. Tiny fat containing umbilical hernia. No acute pelvic fracture. Please see separately dictated CT lumbar spine 07/09/2021. IMPRESSION: 1. No acute intracranial abnormality. 2. No acute displaced fracture or traumatic listhesis of the cervical spine. 3. Trace volume supero-anterior mediastinal hematoma posterior to the manubrium of unclear  etiology. No associated aortic injury or findings to suggest aortic injury. No associated sternal/manubrial or costochondral junction fractures. 4. Otherwise no acute traumatic injury to the chest, abdomen, or pelvis. 5. Please see separately dictated CT thoracic and lumbar spine 07/09/2021. 6. Partially visualized distal comminuted right radial fracture. Other imaging findings of potential clinical significance: 1. 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 2. Partial empty sella. Findings is often a normal anatomic variant but can be associated with idiopathic intracranial hypertension (pseudotumor cerebri). 3. 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 4. A 0.8 cm left lower lobe pulmonary nodule. Non-contrast chest CT at 6-12 months is recommended. If the nodule is stable at time of repeat CT, then future CT at 18-24 months (from today's scan) is considered optional for low-risk patients, but is recommended for high-risk patients. This recommendation follows the consensus statement: Guidelines for Management of Incidental Pulmonary Nodules Detected on CT Images: From the Fleischner Society 2017; Radiology 2017; 284:228-243. Electronically Signed   By: Tish Frederickson M.D.    On: 07/09/2021 20:40   CT T-SPINE NO CHARGE  Addendum Date: 07/09/2021   ADDENDUM REPORT: 07/09/2021 21:12 ADDENDUM: CT thoracic spine impression #2 discussed with Dr. Silverio Lay by telephone at 8:55 p.m. on 07/09/2021. Electronically Signed   By: Jackey Loge D.O.   On: 07/09/2021 21:12   Result Date: 07/09/2021 CLINICAL DATA:  Trauma. EXAM: CT THORACIC AND LUMBAR SPINE WITHOUT CONTRAST TECHNIQUE: Multidetector CT imaging of the thoracic and lumbar spine was performed without contrast. Multiplanar CT image reconstructions were also generated. COMPARISON:  None. FINDINGS: CT THORACIC SPINE FINDINGS In correlating with the concurrently performed CT of the cervical spine, there appear to be bilateral cervical ribs at the C7 level. Alignment: Cervical dextrocurvature. No significant spondylolisthesis. Vertebrae: There is mild anterior wedging of the T11 vertebral body. Additionally, there is a mild T12 superior endplate compression deformity (less than 20% height loss). There is a transversely oriented fracture traversing the T11 spinous process and bilateral T11 inferior articular processes. This is suggestive of a flexion/distraction injury mechanism, and this fracture may be unstable. A subtle nondisplaced fracture is also questioned through the right T12 superior articular process (series 504, image 38). Multilevel degenerative endplate irregularity with Schmorl nodes. Paraspinal and other soft tissues: Please refer to the concurrently performed CT chest/abdomen/pelvis for a description of intrathoracic and abdominopelvic soft tissue findings. Disc levels: Moderate disc space narrowing throughout the thoracic spine. No appreciable significant spinal canal stenosis. No high-grade bony neural foraminal narrowing. CT LUMBAR SPINE FINDINGS Segmentation: 5 lumbar vertebrae. The caudal most well-formed intervertebral disc space is designated L5-S1. Alignment: No significant spondylolisthesis. Vertebrae: Vertebral body  height is maintained. No evidence of acute fracture to the lumbar spine. Multilevel degenerative endplate irregularity. Paraspinal and other soft tissues: The concurrently performed CT chest/abdomen/pelvis for description of abdominopelvic soft tissue findings. Disc levels: No more than mild disc space narrowing at any level. Lumbar spondylosis with level by level findings most notably as follows. L4-L5: Disc bulge with mild endplate spurring. Facet arthrosis and ligamentum flavum hypertrophy. Suspected moderate central canal stenosis with bilateral subarticular narrowing. Moderate right neural foraminal narrowing. IMPRESSION: CT THORACIC SPINE IMPRESSION 1. Please note cervical ribs are present bilaterally at the C7 level. 2. Acute transversely oriented fracture traversing the T11 spinous process and bilateral T11 inferior articular processes. Additionally, there is a mild T12 superior endplate compression deformity, likely acute. Mild anterior wedging of the T11  vertebra, also possibly acute. The constellation of findings is suggestive of a flexion/distraction injury, and this fracture may be unstable. A thoracic spine MRI is recommended to assess for any associated ligamentous injury, and to exclude spinal cord injury. A subtle nondisplaced fracture through the right T12 superior articular process is also questioned. 3. Thoracic spondylosis, as outlined. No appreciable significant spinal canal stenosis. No high-grade bony neural foraminal narrowing. 4. Thoracic dextrocurvature. 5. Please refer to the concurrently performed CT chest/abdomen/pelvis for a description of intrathoracic and abdominopelvic soft tissue findings. CT LUMBAR SPINE IMPRESSION 1. No evidence of acute fracture to the lumbar spine. 2. Lumbar spondylosis, as described and greatest at L4-L5. At this level, there is suspected moderate central canal stenosis with bilateral subarticular narrowing, as well as moderate right neural foraminal  narrowing. 3. Please refer to the concurrently performed CT chest/abdomen/pelvis for description of abdominopelvic soft tissue findings. Electronically Signed: By: Jackey Loge D.O. On: 07/09/2021 20:25   CT L-SPINE NO CHARGE  Addendum Date: 07/09/2021   ADDENDUM REPORT: 07/09/2021 21:12 ADDENDUM: CT thoracic spine impression #2 discussed with Dr. Silverio Lay by telephone at 8:55 p.m. on 07/09/2021. Electronically Signed   By: Jackey Loge D.O.   On: 07/09/2021 21:12   Result Date: 07/09/2021 CLINICAL DATA:  Trauma. EXAM: CT THORACIC AND LUMBAR SPINE WITHOUT CONTRAST TECHNIQUE: Multidetector CT imaging of the thoracic and lumbar spine was performed without contrast. Multiplanar CT image reconstructions were also generated. COMPARISON:  None. FINDINGS: CT THORACIC SPINE FINDINGS In correlating with the concurrently performed CT of the cervical spine, there appear to be bilateral cervical ribs at the C7 level. Alignment: Cervical dextrocurvature. No significant spondylolisthesis. Vertebrae: There is mild anterior wedging of the T11 vertebral body. Additionally, there is a mild T12 superior endplate compression deformity (less than 20% height loss). There is a transversely oriented fracture traversing the T11 spinous process and bilateral T11 inferior articular processes. This is suggestive of a flexion/distraction injury mechanism, and this fracture may be unstable. A subtle nondisplaced fracture is also questioned through the right T12 superior articular process (series 504, image 38). Multilevel degenerative endplate irregularity with Schmorl nodes. Paraspinal and other soft tissues: Please refer to the concurrently performed CT chest/abdomen/pelvis for a description of intrathoracic and abdominopelvic soft tissue findings. Disc levels: Moderate disc space narrowing throughout the thoracic spine. No appreciable significant spinal canal stenosis. No high-grade bony neural foraminal narrowing. CT LUMBAR SPINE FINDINGS  Segmentation: 5 lumbar vertebrae. The caudal most well-formed intervertebral disc space is designated L5-S1. Alignment: No significant spondylolisthesis. Vertebrae: Vertebral body height is maintained. No evidence of acute fracture to the lumbar spine. Multilevel degenerative endplate irregularity. Paraspinal and other soft tissues: The concurrently performed CT chest/abdomen/pelvis for description of abdominopelvic soft tissue findings. Disc levels: No more than mild disc space narrowing at any level. Lumbar spondylosis with level by level findings most notably as follows. L4-L5: Disc bulge with mild endplate spurring. Facet arthrosis and ligamentum flavum hypertrophy. Suspected moderate central canal stenosis with bilateral subarticular narrowing. Moderate right neural foraminal narrowing. IMPRESSION: CT THORACIC SPINE IMPRESSION 1. Please note cervical ribs are present bilaterally at the C7 level. 2. Acute transversely oriented fracture traversing the T11 spinous process and bilateral T11 inferior articular processes. Additionally, there is a mild T12 superior endplate compression deformity, likely acute. Mild anterior wedging of the T11 vertebra, also possibly acute. The constellation of findings is suggestive of a flexion/distraction injury, and this fracture may be unstable. A thoracic spine MRI is recommended to assess  for any associated ligamentous injury, and to exclude spinal cord injury. A subtle nondisplaced fracture through the right T12 superior articular process is also questioned. 3. Thoracic spondylosis, as outlined. No appreciable significant spinal canal stenosis. No high-grade bony neural foraminal narrowing. 4. Thoracic dextrocurvature. 5. Please refer to the concurrently performed CT chest/abdomen/pelvis for a description of intrathoracic and abdominopelvic soft tissue findings. CT LUMBAR SPINE IMPRESSION 1. No evidence of acute fracture to the lumbar spine. 2. Lumbar spondylosis, as described  and greatest at L4-L5. At this level, there is suspected moderate central canal stenosis with bilateral subarticular narrowing, as well as moderate right neural foraminal narrowing. 3. Please refer to the concurrently performed CT chest/abdomen/pelvis for description of abdominopelvic soft tissue findings. Electronically Signed: By: Jackey Loge D.O. On: 07/09/2021 20:25   DG Hand Complete Right  Result Date: 07/09/2021 CLINICAL DATA:  Fall, deformity EXAM: RIGHT WRIST - COMPLETE 3+ VIEW; RIGHT FOREARM - 2 VIEW; RIGHT HAND - COMPLETE 3+ VIEW COMPARISON:  None. FINDINGS: Forearm: The comminuted and displaced distal radial fracture is assessed below. No acute fracture of the proximal radius or ulna is identified. There is lucency through an olecranon enthesophyte which may reflect a remote fracture. There is no significant overlying soft tissue swelling to suggest acute fracture. Elbow alignment appears maintained. Wrist: There is a severely comminuted impacted fracture of the distal radius with intra-articular extension. There is mild dorsal displacement and angulation of the dominant distal fragment. There is a mildly displaced ulnar styloid fracture. Radiocarpal alignment is grossly intact. There is significant surrounding soft tissue swelling. Hand: No additional fracture is seen. Alignment of the bones of the hand is within normal limits. The joint spaces are preserved. There is a punctate radiopaque foreign body within the soft tissues of the index finger overlying the radial aspect of the index finger proximal phalanx. IMPRESSION: 1. Severely comminuted and mildly displaced impacted fracture of the distal radius with intra-articular extension. 2. Mildly displaced ulnar styloid fracture. 3. Punctate radiopaque foreign body within the soft tissues along the radial aspect of the index finger proximal phalanx. 4. Probable remote fracture through an olecranon enthesophyte. Electronically Signed   By: Lesia Hausen M.D.   On: 07/09/2021 15:42    Assessment/Plan: Patient presents with a displaced comminuted distal radius fracture.  This was reduced in the emergency room with some improvement in the alignment but still is rather displaced.  I recommended open reduction internal fixation with a volar plate.  We discussed the risks include bleeding, infection, damage to surrounding structures need for additional procedures.  We discussed bony complications including nonunion, malunion and hardware complications.  I discussed the potential need for dorsal bridge plate should the fixation be inadequate volarly.  Tentative plan is to do this Monday afternoon pending other valuations and his overall progress.  Call with any questions.  Allena Napoleon 07/10/2021, 12:53 PM

## 2021-07-10 NOTE — Progress Notes (Signed)
Patient has home cpap for use and will self place when ready.  

## 2021-07-10 NOTE — Consult Note (Signed)
Reason for Consult:thoracic spine fractures Referring Physician: Wilkie Church, Craig Church is an 55 y.o. male.  HPI: whom fell off a ladder approximately 7 feet. Neurologically intact, multiple spinous fractures.  Past Medical History:  Diagnosis Date   Hypertension    Seizure disorder [facility])     Past Surgical History:  Procedure Laterality Date   CHOLECYSTECTOMY      No family history on file.  Social History:  reports that he has never smoked. His smokeless tobacco use includes chew. He reports that he does not drink alcohol and does not use drugs.  Allergies:  Allergies  Allergen Reactions   Penicillins Other (See Comments)    Unknown childhood reaction    Medications: I have reviewed the patient's current medications.  Results for orders placed or performed during the hospital encounter of 07/09/21 (from the past 48 hour(s))  CBC with Differential     Status: Abnormal   Collection Time: 07/09/21  4:30 PM  Result Value Ref Range   WBC 12.0 (H) 4.0 - 10.5 K/uL   RBC 4.29 4.22 - 5.81 MIL/uL   Hemoglobin 11.5 (L) 13.0 - 17.0 g/dL   HCT 16.1 (L) 09.6 - 04.5 %   MCV 83.9 80.0 - 100.0 fL   MCH 26.8 26.0 - 34.0 pg   MCHC 31.9 30.0 - 36.0 g/dL   RDW 40.9 81.1 - 91.4 %   Platelets 294 150 - 400 K/uL   nRBC 0.0 0.0 - 0.2 %   Neutrophils Relative % 85 %   Neutro Abs 10.3 (H) 1.7 - 7.7 K/uL   Lymphocytes Relative 8 %   Lymphs Abs 0.9 0.7 - 4.0 K/uL   Monocytes Relative 5 %   Monocytes Absolute 0.6 0.1 - 1.0 K/uL   Eosinophils Relative 1 %   Eosinophils Absolute 0.1 0.0 - 0.5 K/uL   Basophils Relative 0 %   Basophils Absolute 0.0 0.0 - 0.1 K/uL   Immature Granulocytes 1 %   Abs Immature Granulocytes 0.09 (H) 0.00 - 0.07 K/uL    Comment: Performed at [facility], [address], [place], [place] [address]  Basic metabolic panel     Status: Abnormal   Collection Time: 07/09/21  4:30 PM  Result Value Ref Range   Sodium 138 135 - 145 mmol/L   Potassium 3.3 (L)  3.5 - 5.1 mmol/L   Chloride 109 98 - 111 mmol/L   CO2 21 (L) 22 - 32 mmol/L   Glucose, Bld 98 70 - 99 mg/dL    Comment: Glucose reference range applies only to samples taken after fasting for at least 8 hours.   BUN 20 6 - 20 mg/dL   Creatinine, Ser 6.21 0.61 - 1.24 mg/dL   Calcium 9.1 8.9 - 30.8 mg/dL   GFR, Estimated >65 >78 mL/min    Comment: (NOTE) Calculated using the CKD-EPI Creatinine Equation (2021)    Anion gap 8 5 - 15    Comment: Performed at [facility], [address] [place], [place] [address]  Protime-INR     Status: None   Collection Time: 07/09/21  4:30 PM  Result Value Ref Range   Prothrombin Time 12.4 11.4 - 15.2 seconds   INR 0.9 0.8 - 1.2    Comment: (NOTE) INR goal varies based on device and disease states. Performed at [facility], [address], [place], [place] [address]   SARS CORONAVIRUS 2 (TAT 6-24 HRS) Nasopharyngeal Nasopharyngeal Swab  Status: None   Collection Time: 07/10/21  3:51 AM   Specimen: Nasopharyngeal Swab  Result Value Ref Range   SARS Coronavirus 2 NEGATIVE NEGATIVE    Comment: (NOTE) SARS-CoV-2 target nucleic acids are NOT DETECTED.  The SARS-CoV-2 RNA is generally detectable in upper and lower respiratory specimens during the acute phase of infection. Negative results do not preclude SARS-CoV-2 infection, do not rule out co-infections with other pathogens, and should not be used as the sole basis for treatment or other patient management decisions. Negative results must be combined with clinical observations, patient history, and epidemiological information. The expected result is Negative.  Fact Sheet for Patients: HairSlick.no  Fact Sheet for Healthcare Providers: quierodirigir.com  This test is not yet approved or cleared by the [place] FDA and  has been authorized for detection and/or diagnosis of SARS-CoV-2 by FDA under an Emergency Use  Authorization (EUA). This EUA will remain  in effect (meaning this test can be used) for the duration of the COVID-19 declaration under Se ction 564(b)(1) of the Act, 21 U.S.C. section 360bbb-3(b)(1), unless the authorization is terminated or revoked sooner.  Performed at [facility], [address] [place], [place] [address]   CBC     Status: Abnormal   Collection Time: 07/10/21  4:18 AM  Result Value Ref Range   WBC 8.0 4.0 - 10.5 K/uL   RBC 4.20 (L) 4.22 - 5.81 MIL/uL   Hemoglobin 11.2 (L) 13.0 - 17.0 g/dL   HCT 83.6 (L) 62.9 - 47.6 %   MCV 85.0 80.0 - 100.0 fL   MCH 26.7 26.0 - 34.0 pg   MCHC 31.4 30.0 - 36.0 g/dL   RDW 54.6 50.3 - 54.6 %   Platelets 261 150 - 400 K/uL   nRBC 0.0 0.0 - 0.2 %    Comment: Performed at [facility], [address] [place], [place] [address]  Basic metabolic panel     Status: Abnormal   Collection Time: 07/10/21  4:18 AM  Result Value Ref Range   Sodium 137 135 - 145 mmol/L   Potassium 3.8 3.5 - 5.1 mmol/L   Chloride 109 98 - 111 mmol/L   CO2 21 (L) 22 - 32 mmol/L   Glucose, Bld 105 (H) 70 - 99 mg/dL    Comment: Glucose reference range applies only to samples taken after fasting for at least 8 hours.   BUN 15 6 - 20 mg/dL   Creatinine, Ser 7.51 0.61 - 1.24 mg/dL   Calcium 8.6 (L) 8.9 - 10.3 mg/dL   GFR, Estimated >70 >01 mL/min    Comment: (NOTE) Calculated using the CKD-EPI Creatinine Equation (2021)    Anion gap 7 5 - 15    Comment: Performed at [facility], [address], [place], [place] [address]  HIV Antibody (routine testing w rflx)     Status: None   Collection Time: 07/10/21  5:44 AM  Result Value Ref Range   HIV Screen 4th Generation wRfx Non Reactive Non Reactive    Comment: Performed at [facility] [facility], [address], [place], [place] [address]  CBC     Status: Abnormal   Collection Time: 07/10/21  5:44 AM  Result Value Ref Range   WBC 8.2 4.0 - 10.5 K/uL   RBC 4.28 4.22 - 5.81 MIL/uL    Hemoglobin 11.7 (L) 13.0 - 17.0 g/dL   HCT 16.3 (L) 84.6 - 65.9 %   MCV 83.6 80.0 -  100.0 fL   MCH 27.3 26.0 - 34.0 pg   MCHC 32.7 30.0 - 36.0 g/dL   RDW 16.1 09.6 - 04.5 %   Platelets 277 150 - 400 K/uL   nRBC 0.0 0.0 - 0.2 %    Comment: Performed at [facility], [address] [place], [place] [address]  Creatinine, serum     Status: None   Collection Time: 07/10/21  5:44 AM  Result Value Ref Range   Creatinine, Ser 0.85 0.61 - 1.24 mg/dL   GFR, Estimated >19 >14 mL/min    Comment: (NOTE) Calculated using the CKD-EPI Creatinine Equation (2021) Performed at [facility], [address], [place], [place] [address]     DG Forearm Right  Result Date: 07/09/2021 CLINICAL DATA:  Fall, deformity EXAM: RIGHT WRIST - COMPLETE 3+ VIEW; RIGHT FOREARM - 2 VIEW; RIGHT HAND - COMPLETE 3+ VIEW COMPARISON:  None. FINDINGS: Forearm: The comminuted and displaced distal radial fracture is assessed below. No acute fracture of the proximal radius or ulna is identified. There is lucency through an olecranon enthesophyte which may reflect a remote fracture. There is no significant overlying soft tissue swelling to suggest acute fracture. Elbow alignment appears maintained. Wrist: There is a severely comminuted impacted fracture of the distal radius with intra-articular extension. There is mild dorsal displacement and angulation of the dominant distal fragment. There is a mildly displaced ulnar styloid fracture. Radiocarpal alignment is grossly intact. There is significant surrounding soft tissue swelling. Hand: No additional fracture is seen. Alignment of the bones of the hand is within normal limits. The joint spaces are preserved. There is a punctate radiopaque foreign body within the soft tissues of the index finger overlying the radial aspect of the index finger proximal phalanx. IMPRESSION: 1. Severely comminuted and mildly displaced impacted fracture of the distal radius with intra-articular  extension. 2. Mildly displaced ulnar styloid fracture. 3. Punctate radiopaque foreign body within the soft tissues along the radial aspect of the index finger proximal phalanx. 4. Probable remote fracture through an olecranon enthesophyte. Electronically Signed   By: Lesia Hausen M.D.   On: 07/09/2021 15:42   DG Wrist Complete Right  Result Date: 07/09/2021 CLINICAL DATA:  Right wrist reduction. EXAM: RIGHT WRIST - COMPLETE 3+ VIEW COMPARISON:  Pre reduction radiograph earlier today FINDINGS: Frontal and lateral views of the right wrist obtained with overlying splint material in place. Improved alignment of comminuted and displaced distal radius fracture with mild residual radial displacement. Unchanged alignment of ulnar styloid fracture. IMPRESSION: Improved alignment of comminuted distal radius fracture postreduction with mild residual radial displacement. Unchanged ulnar styloid fracture. Electronically Signed   By: Narda Rutherford M.D.   On: 07/09/2021 20:15   DG Wrist Complete Right  Result Date: 07/09/2021 CLINICAL DATA:  Fall, deformity EXAM: RIGHT WRIST - COMPLETE 3+ VIEW; RIGHT FOREARM - 2 VIEW; RIGHT HAND - COMPLETE 3+ VIEW COMPARISON:  None. FINDINGS: Forearm: The comminuted and displaced distal radial fracture is assessed below. No acute fracture of the proximal radius or ulna is identified. There is lucency through an olecranon enthesophyte which may reflect a remote fracture. There is no significant overlying soft tissue swelling to suggest acute fracture. Elbow alignment appears maintained. Wrist: There is a severely comminuted impacted fracture of the distal radius with intra-articular extension. There is mild dorsal displacement and angulation of the dominant distal fragment. There is a mildly displaced ulnar styloid fracture. Radiocarpal alignment is grossly intact. There is significant surrounding soft  tissue swelling. Hand: No additional fracture is seen. Alignment of the bones of the  hand is within normal limits. The joint spaces are preserved. There is a punctate radiopaque foreign body within the soft tissues of the index finger overlying the radial aspect of the index finger proximal phalanx. IMPRESSION: 1. Severely comminuted and mildly displaced impacted fracture of the distal radius with intra-articular extension. 2. Mildly displaced ulnar styloid fracture. 3. Punctate radiopaque foreign body within the soft tissues along the radial aspect of the index finger proximal phalanx. 4. Probable remote fracture through an olecranon enthesophyte. Electronically Signed   By: Lesia Hausen M.D.   On: 07/09/2021 15:42   CT Head Wo Contrast  Result Date: 07/09/2021 CLINICAL DATA:  Trauma, low back pain. EXAM: CT HEAD WITHOUT CONTRAST CT CERVICAL SPINE WITHOUT CONTRAST CT CHEST, ABDOMEN AND PELVIS WITH CONTRAST TECHNIQUE: Contiguous axial images were obtained from the base of the skull through the vertex without intravenous contrast. Multidetector CT imaging of the cervical spine was performed without intravenous contrast. Multiplanar CT image reconstructions were also generated. Multidetector CT imaging of the chest, abdomen and pelvis was performed following the standard protocol during bolus administration of intravenous contrast. CONTRAST:  OMNIPAQUE IOHEXOL 350 MG/ML SOLN COMPARISON:  None. FINDINGS: CT HEAD FINDINGS Brain: No evidence of large-territorial acute infarction. No parenchymal hemorrhage. 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 No extra-axial collection. No mass effect or midline shift. No hydrocephalus. Basilar cisterns are patent. Partial empty sella. Vascular: No hyperdense vessel. Skull: No acute fracture or focal lesion. Sinuses/Orbits: Paranasal sinuses and mastoid air cells are clear. The orbits are unremarkable. Other: None. CT CERVICAL FINDINGS Alignment: Normal. Skull base  and vertebrae: Partial fusion of the C4-C5 levels. Multilevel degenerative changes of the spine. 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 No severe osseous central canal stenosis. No acute fracture. No aggressive appearing focal osseous lesion or focal pathologic process. Soft tissues and spinal canal: No prevertebral fluid or swelling. No visible canal hematoma. Upper chest: Unremarkable. Other: None. CT CHEST FINDINGS Ports and Devices: None. Lungs/airways: Bilateral lower lobe subsegmental atelectasis. No focal consolidation. There is a difficult to measure slightly spiculated 1.1 x 0.5 cm right lower lobe subpleural nodule (5:87, 7:108). Associated mild tethering of the pleura is noted. Few scattered calcified pulmonary micronodules within the left lung. No pulmonary mass. No pulmonary contusion or laceration. No pneumatocele formation. The central airways are patent. Pleura: No pleural effusion. No pneumothorax. No hemothorax. Lymph Nodes: No mediastinal, hilar, or axillary lymphadenopathy. Mediastinum: No pneumomediastinum. Trace high density free fluid within the superior anterior mediastinum (3:21) just posterior to the manubrium. Normal fat plane between this fluid and the aorta is noted. No aortic injury. The thoracic aorta is normal in caliber. The heart is normal in size. No significant pericardial effusion. The esophagus is unremarkable. The thyroid is unremarkable. Chest Wall / Breasts: No chest wall mass. Musculoskeletal: No acute rib or sternal fracture. Please see separately dictated CT thoracic spine 07/09/2021. partially visualized right upper extremity with a partially visualized comminuted radial fracture. CT ABDOMEN AND PELVIS FINDINGS Liver: Not enlarged. No focal lesion. No laceration or subcapsular hematoma. Biliary System: Status post cholecystectomy. No biliary ductal dilatation. Pancreas: Normal pancreatic contour. No main pancreatic duct dilatation. Spleen: Not enlarged. No  focal lesion. No laceration, subcapsular hematoma, or vascular injury. Adrenal Glands: No nodularity bilaterally. Kidneys: Bilateral kidneys enhance symmetrically. No hydronephrosis. No  contusion, laceration, or subcapsular hematoma. No injury to the vascular structures or collecting systems. No hydroureter. The urinary bladder is unremarkable. On delayed imaging, there is no urothelial wall thickening and there are no filling defects in the opacified portions of the bilateral collecting systems or ureters. Bowel: No small or large bowel wall thickening or dilatation. Question appendiceal stump with no findings of right lower quadrant inflammatory change Mesentery, Omentum, and Peritoneum: No simple free fluid ascites. No pneumoperitoneum. No hemoperitoneum. No mesenteric hematoma identified. No organized fluid collection. Pelvic Organs: Normal. Lymph Nodes: No abdominal, pelvic, inguinal lymphadenopathy. Vasculature: No abdominal aorta or iliac aneurysm. No active contrast extravasation or pseudoaneurysm. Musculoskeletal: No significant soft tissue hematoma. Tiny fat containing umbilical hernia. No acute pelvic fracture. Please see separately dictated CT lumbar spine 07/09/2021. IMPRESSION: 1. No acute intracranial abnormality. 2. No acute displaced fracture or traumatic listhesis of the cervical spine. 3. Trace volume supero-anterior mediastinal hematoma posterior to the manubrium of unclear etiology. No associated aortic injury or findings to suggest aortic injury. No associated sternal/manubrial or costochondral junction fractures. 4. Otherwise no acute traumatic injury to the chest, abdomen, or pelvis. 5. Please see separately dictated CT thoracic and lumbar spine 07/09/2021. 6. Partially visualized distal comminuted right radial fracture. Other imaging findings of potential clinical significance: 1. 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 2. Partial empty sella. Findings is  often a normal anatomic variant but can be associated with idiopathic intracranial hypertension (pseudotumor cerebri). 3. 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 4. A 0.8 cm left lower lobe pulmonary nodule. Non-contrast chest CT at 6-12 months is recommended. If the nodule is stable at time of repeat CT, then future CT at 18-24 months (from today's scan) is considered optional for low-risk patients, but is recommended for high-risk patients. This recommendation follows the consensus statement: Guidelines for Management of Incidental Pulmonary Nodules Detected on CT Images: From the Fleischner Society 2017; Radiology 2017; 284:228-243. Electronically Signed   By: Tish Frederickson M.D.   On: 07/09/2021 20:40   CT Cervical Spine Wo Contrast  Result Date: 07/09/2021 CLINICAL DATA:  Trauma, low back pain. EXAM: CT HEAD WITHOUT CONTRAST CT CERVICAL SPINE WITHOUT CONTRAST CT CHEST, ABDOMEN AND PELVIS WITH CONTRAST TECHNIQUE: Contiguous axial images were obtained from the base of the skull through the vertex without intravenous contrast. Multidetector CT imaging of the cervical spine was performed without intravenous contrast. Multiplanar CT image reconstructions were also generated. Multidetector CT imaging of the chest, abdomen and pelvis was performed following the standard protocol during bolus administration of intravenous contrast. CONTRAST:  OMNIPAQUE IOHEXOL 350 MG/ML SOLN COMPARISON:  None. FINDINGS: CT HEAD FINDINGS Brain: No evidence of large-territorial acute infarction. No parenchymal hemorrhage. 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 No extra-axial collection. No mass effect or midline shift. No hydrocephalus. Basilar cisterns are patent. Partial empty sella. Vascular: No hyperdense vessel. Skull: No acute fracture or focal lesion. Sinuses/Orbits: Paranasal sinuses  and mastoid air cells are clear. The orbits are unremarkable. Other: None. CT CERVICAL FINDINGS Alignment: Normal. Skull base and vertebrae: Partial fusion of the C4-C5 levels. Multilevel degenerative changes of the spine. 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 No severe osseous central canal stenosis. No acute fracture. No aggressive appearing focal osseous lesion or focal pathologic process. Soft tissues and spinal canal: No prevertebral fluid or swelling. No  visible canal hematoma. Upper chest: Unremarkable. Other: None. CT CHEST FINDINGS Ports and Devices: None. Lungs/airways: Bilateral lower lobe subsegmental atelectasis. No focal consolidation. There is a difficult to measure slightly spiculated 1.1 x 0.5 cm right lower lobe subpleural nodule (5:87, 7:108). Associated mild tethering of the pleura is noted. Few scattered calcified pulmonary micronodules within the left lung. No pulmonary mass. No pulmonary contusion or laceration. No pneumatocele formation. The central airways are patent. Pleura: No pleural effusion. No pneumothorax. No hemothorax. Lymph Nodes: No mediastinal, hilar, or axillary lymphadenopathy. Mediastinum: No pneumomediastinum. Trace high density free fluid within the superior anterior mediastinum (3:21) just posterior to the manubrium. Normal fat plane between this fluid and the aorta is noted. No aortic injury. The thoracic aorta is normal in caliber. The heart is normal in size. No significant pericardial effusion. The esophagus is unremarkable. The thyroid is unremarkable. Chest Wall / Breasts: No chest wall mass. Musculoskeletal: No acute rib or sternal fracture. Please see separately dictated CT thoracic spine 07/09/2021. partially visualized right upper extremity with a partially visualized comminuted radial fracture. CT ABDOMEN AND PELVIS FINDINGS Liver: Not enlarged. No focal lesion. No laceration or subcapsular hematoma. Biliary System: Status post cholecystectomy. No  biliary ductal dilatation. Pancreas: Normal pancreatic contour. No main pancreatic duct dilatation. Spleen: Not enlarged. No focal lesion. No laceration, subcapsular hematoma, or vascular injury. Adrenal Glands: No nodularity bilaterally. Kidneys: Bilateral kidneys enhance symmetrically. No hydronephrosis. No contusion, laceration, or subcapsular hematoma. No injury to the vascular structures or collecting systems. No hydroureter. The urinary bladder is unremarkable. On delayed imaging, there is no urothelial wall thickening and there are no filling defects in the opacified portions of the bilateral collecting systems or ureters. Bowel: No small or large bowel wall thickening or dilatation. Question appendiceal stump with no findings of right lower quadrant inflammatory change Mesentery, Omentum, and Peritoneum: No simple free fluid ascites. No pneumoperitoneum. No hemoperitoneum. No mesenteric hematoma identified. No organized fluid collection. Pelvic Organs: Normal. Lymph Nodes: No abdominal, pelvic, inguinal lymphadenopathy. Vasculature: No abdominal aorta or iliac aneurysm. No active contrast extravasation or pseudoaneurysm. Musculoskeletal: No significant soft tissue hematoma. Tiny fat containing umbilical hernia. No acute pelvic fracture. Please see separately dictated CT lumbar spine 07/09/2021. IMPRESSION: 1. No acute intracranial abnormality. 2. No acute displaced fracture or traumatic listhesis of the cervical spine. 3. Trace volume supero-anterior mediastinal hematoma posterior to the manubrium of unclear etiology. No associated aortic injury or findings to suggest aortic injury. No associated sternal/manubrial or costochondral junction fractures. 4. Otherwise no acute traumatic injury to the chest, abdomen, or pelvis. 5. Please see separately dictated CT thoracic and lumbar spine 07/09/2021. 6. Partially visualized distal comminuted right radial fracture. Other imaging findings of potential clinical  significance: 1. 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 2. Partial empty sella. Findings is often a normal anatomic variant but can be associated with idiopathic intracranial hypertension (pseudotumor cerebri). 3. 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 4. A 0.8 cm left lower lobe pulmonary nodule. Non-contrast chest CT at 6-12 months is recommended. If the nodule is stable at time of repeat CT, then future CT at 18-24 months (from today's scan) is considered optional for low-risk patients, but is recommended for high-risk patients. This recommendation follows the consensus statement: Guidelines for Management of Incidental Pulmonary Nodules Detected on CT Images: From the Fleischner Society 2017; Radiology 2017; 284:228-243. Electronically Signed   By: Normajean Glasgow.D.  On: 07/09/2021 20:40   CT CHEST ABDOMEN PELVIS W CONTRAST  Result Date: 07/09/2021 CLINICAL DATA:  Trauma, low back pain. EXAM: CT HEAD WITHOUT CONTRAST CT CERVICAL SPINE WITHOUT CONTRAST CT CHEST, ABDOMEN AND PELVIS WITH CONTRAST TECHNIQUE: Contiguous axial images were obtained from the base of the skull through the vertex without intravenous contrast. Multidetector CT imaging of the cervical spine was performed without intravenous contrast. Multiplanar CT image reconstructions were also generated. Multidetector CT imaging of the chest, abdomen and pelvis was performed following the standard protocol during bolus administration of intravenous contrast. CONTRAST:  OMNIPAQUE IOHEXOL 350 MG/ML SOLN COMPARISON:  None. FINDINGS: CT HEAD FINDINGS Brain: No evidence of large-territorial acute infarction. No parenchymal hemorrhage. 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 No extra-axial collection. No mass effect or midline shift. No hydrocephalus. Basilar cisterns are patent.  Partial empty sella. Vascular: No hyperdense vessel. Skull: No acute fracture or focal lesion. Sinuses/Orbits: Paranasal sinuses and mastoid air cells are clear. The orbits are unremarkable. Other: None. CT CERVICAL FINDINGS Alignment: Normal. Skull base and vertebrae: Partial fusion of the C4-C5 levels. Multilevel degenerative changes of the spine. 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 No severe osseous central canal stenosis. No acute fracture. No aggressive appearing focal osseous lesion or focal pathologic process. Soft tissues and spinal canal: No prevertebral fluid or swelling. No visible canal hematoma. Upper chest: Unremarkable. Other: None. CT CHEST FINDINGS Ports and Devices: None. Lungs/airways: Bilateral lower lobe subsegmental atelectasis. No focal consolidation. There is a difficult to measure slightly spiculated 1.1 x 0.5 cm right lower lobe subpleural nodule (5:87, 7:108). Associated mild tethering of the pleura is noted. Few scattered calcified pulmonary micronodules within the left lung. No pulmonary mass. No pulmonary contusion or laceration. No pneumatocele formation. The central airways are patent. Pleura: No pleural effusion. No pneumothorax. No hemothorax. Lymph Nodes: No mediastinal, hilar, or axillary lymphadenopathy. Mediastinum: No pneumomediastinum. Trace high density free fluid within the superior anterior mediastinum (3:21) just posterior to the manubrium. Normal fat plane between this fluid and the aorta is noted. No aortic injury. The thoracic aorta is normal in caliber. The heart is normal in size. No significant pericardial effusion. The esophagus is unremarkable. The thyroid is unremarkable. Chest Wall / Breasts: No chest wall mass. Musculoskeletal: No acute rib or sternal fracture. Please see separately dictated CT thoracic spine 07/09/2021. partially visualized right upper extremity with a partially visualized comminuted radial fracture. CT ABDOMEN AND PELVIS  FINDINGS Liver: Not enlarged. No focal lesion. No laceration or subcapsular hematoma. Biliary System: Status post cholecystectomy. No biliary ductal dilatation. Pancreas: Normal pancreatic contour. No main pancreatic duct dilatation. Spleen: Not enlarged. No focal lesion. No laceration, subcapsular hematoma, or vascular injury. Adrenal Glands: No nodularity bilaterally. Kidneys: Bilateral kidneys enhance symmetrically. No hydronephrosis. No contusion, laceration, or subcapsular hematoma. No injury to the vascular structures or collecting systems. No hydroureter. The urinary bladder is unremarkable. On delayed imaging, there is no urothelial wall thickening and there are no filling defects in the opacified portions of the bilateral collecting systems or ureters. Bowel: No small or large bowel wall thickening or dilatation. Question appendiceal stump with no findings of right lower quadrant inflammatory change Mesentery, Omentum, and Peritoneum: No simple free fluid ascites. No pneumoperitoneum. No hemoperitoneum. No mesenteric hematoma identified. No organized fluid collection. Pelvic Organs: Normal. Lymph Nodes: No abdominal, pelvic, inguinal lymphadenopathy. Vasculature: No abdominal aorta or iliac aneurysm. No  active contrast extravasation or pseudoaneurysm. Musculoskeletal: No significant soft tissue hematoma. Tiny fat containing umbilical hernia. No acute pelvic fracture. Please see separately dictated CT lumbar spine 07/09/2021. IMPRESSION: 1. No acute intracranial abnormality. 2. No acute displaced fracture or traumatic listhesis of the cervical spine. 3. Trace volume supero-anterior mediastinal hematoma posterior to the manubrium of unclear etiology. No associated aortic injury or findings to suggest aortic injury. No associated sternal/manubrial or costochondral junction fractures. 4. Otherwise no acute traumatic injury to the chest, abdomen, or pelvis. 5. Please see separately dictated CT thoracic and  lumbar spine 07/09/2021. 6. Partially visualized distal comminuted right radial fracture. Other imaging findings of potential clinical significance: 1. 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 2. Partial empty sella. Findings is often a normal anatomic variant but can be associated with idiopathic intracranial hypertension (pseudotumor cerebri). 3. 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 4. A 0.8 cm left lower lobe pulmonary nodule. Non-contrast chest CT at 6-12 months is recommended. If the nodule is stable at time of repeat CT, then future CT at 18-24 months (from today's scan) is considered optional for low-risk patients, but is recommended for high-risk patients. This recommendation follows the consensus statement: Guidelines for Management of Incidental Pulmonary Nodules Detected on CT Images: From the Fleischner Society 2017; Radiology 2017; 284:228-243. Electronically Signed   By: Tish Frederickson M.D.   On: 07/09/2021 20:40   CT T-SPINE NO CHARGE  Addendum Date: 07/09/2021   ADDENDUM REPORT: 07/09/2021 21:12 ADDENDUM: CT thoracic spine impression #2 discussed with Dr. Silverio Lay by telephone at 8:55 p.m. on 07/09/2021. Electronically Signed   By: Jackey Loge D.O.   On: 07/09/2021 21:12   Result Date: 07/09/2021 CLINICAL DATA:  Trauma. EXAM: CT THORACIC AND LUMBAR SPINE WITHOUT CONTRAST TECHNIQUE: Multidetector CT imaging of the thoracic and lumbar spine was performed without contrast. Multiplanar CT image reconstructions were also generated. COMPARISON:  None. FINDINGS: CT THORACIC SPINE FINDINGS In correlating with the concurrently performed CT of the cervical spine, there appear to be bilateral cervical ribs at the C7 level. Alignment: Cervical dextrocurvature. No significant spondylolisthesis. Vertebrae: There is mild anterior wedging of the T11 vertebral body. Additionally, there is a mild T12 superior endplate compression deformity  (less than 20% height loss). There is a transversely oriented fracture traversing the T11 spinous process and bilateral T11 inferior articular processes. This is suggestive of a flexion/distraction injury mechanism, and this fracture may be unstable. A subtle nondisplaced fracture is also questioned through the right T12 superior articular process (series 504, image 38). Multilevel degenerative endplate irregularity with Schmorl nodes. Paraspinal and other soft tissues: Please refer to the concurrently performed CT chest/abdomen/pelvis for a description of intrathoracic and abdominopelvic soft tissue findings. Disc levels: Moderate disc space narrowing throughout the thoracic spine. No appreciable significant spinal canal stenosis. No high-grade bony neural foraminal narrowing. CT LUMBAR SPINE FINDINGS Segmentation: 5 lumbar vertebrae. The caudal most well-formed intervertebral disc space is designated L5-S1. Alignment: No significant spondylolisthesis. Vertebrae: Vertebral body height is maintained. No evidence of acute fracture to the lumbar spine. Multilevel degenerative endplate irregularity. Paraspinal and other soft tissues: The concurrently performed CT chest/abdomen/pelvis for description of abdominopelvic soft tissue findings. Disc levels: No more than mild disc space narrowing at any level. Lumbar spondylosis with level by level findings most notably as follows. L4-L5: Disc bulge with mild endplate spurring. Facet arthrosis and ligamentum flavum hypertrophy. Suspected moderate central canal stenosis with bilateral  subarticular narrowing. Moderate right neural foraminal narrowing. IMPRESSION: CT THORACIC SPINE IMPRESSION 1. Please note cervical ribs are present bilaterally at the C7 level. 2. Acute transversely oriented fracture traversing the T11 spinous process and bilateral T11 inferior articular processes. Additionally, there is a mild T12 superior endplate compression deformity, likely acute. Mild  anterior wedging of the T11 vertebra, also possibly acute. The constellation of findings is suggestive of a flexion/distraction injury, and this fracture may be unstable. A thoracic spine MRI is recommended to assess for any associated ligamentous injury, and to exclude spinal cord injury. A subtle nondisplaced fracture through the right T12 superior articular process is also questioned. 3. Thoracic spondylosis, as outlined. No appreciable significant spinal canal stenosis. No high-grade bony neural foraminal narrowing. 4. Thoracic dextrocurvature. 5. Please refer to the concurrently performed CT chest/abdomen/pelvis for a description of intrathoracic and abdominopelvic soft tissue findings. CT LUMBAR SPINE IMPRESSION 1. No evidence of acute fracture to the lumbar spine. 2. Lumbar spondylosis, as described and greatest at L4-L5. At this level, there is suspected moderate central canal stenosis with bilateral subarticular narrowing, as well as moderate right neural foraminal narrowing. 3. Please refer to the concurrently performed CT chest/abdomen/pelvis for description of abdominopelvic soft tissue findings. Electronically Signed: By: Jackey Loge D.O. On: 07/09/2021 20:25   CT L-SPINE NO CHARGE  Addendum Date: 07/09/2021   ADDENDUM REPORT: 07/09/2021 21:12 ADDENDUM: CT thoracic spine impression #2 discussed with Dr. Silverio Lay by telephone at 8:55 p.m. on 07/09/2021. Electronically Signed   By: Jackey Loge D.O.   On: 07/09/2021 21:12   Result Date: 07/09/2021 CLINICAL DATA:  Trauma. EXAM: CT THORACIC AND LUMBAR SPINE WITHOUT CONTRAST TECHNIQUE: Multidetector CT imaging of the thoracic and lumbar spine was performed without contrast. Multiplanar CT image reconstructions were also generated. COMPARISON:  None. FINDINGS: CT THORACIC SPINE FINDINGS In correlating with the concurrently performed CT of the cervical spine, there appear to be bilateral cervical ribs at the C7 level. Alignment: Cervical dextrocurvature. No  significant spondylolisthesis. Vertebrae: There is mild anterior wedging of the T11 vertebral body. Additionally, there is a mild T12 superior endplate compression deformity (less than 20% height loss). There is a transversely oriented fracture traversing the T11 spinous process and bilateral T11 inferior articular processes. This is suggestive of a flexion/distraction injury mechanism, and this fracture may be unstable. A subtle nondisplaced fracture is also questioned through the right T12 superior articular process (series 504, image 38). Multilevel degenerative endplate irregularity with Schmorl nodes. Paraspinal and other soft tissues: Please refer to the concurrently performed CT chest/abdomen/pelvis for a description of intrathoracic and abdominopelvic soft tissue findings. Disc levels: Moderate disc space narrowing throughout the thoracic spine. No appreciable significant spinal canal stenosis. No high-grade bony neural foraminal narrowing. CT LUMBAR SPINE FINDINGS Segmentation: 5 lumbar vertebrae. The caudal most well-formed intervertebral disc space is designated L5-S1. Alignment: No significant spondylolisthesis. Vertebrae: Vertebral body height is maintained. No evidence of acute fracture to the lumbar spine. Multilevel degenerative endplate irregularity. Paraspinal and other soft tissues: The concurrently performed CT chest/abdomen/pelvis for description of abdominopelvic soft tissue findings. Disc levels: No more than mild disc space narrowing at any level. Lumbar spondylosis with level by level findings most notably as follows. L4-L5: Disc bulge with mild endplate spurring. Facet arthrosis and ligamentum flavum hypertrophy. Suspected moderate central canal stenosis with bilateral subarticular narrowing. Moderate right neural foraminal narrowing. IMPRESSION: CT THORACIC SPINE IMPRESSION 1. Please note cervical ribs are present bilaterally at the C7 level. 2. Acute transversely oriented  fracture  traversing the T11 spinous process and bilateral T11 inferior articular processes. Additionally, there is a mild T12 superior endplate compression deformity, likely acute. Mild anterior wedging of the T11 vertebra, also possibly acute. The constellation of findings is suggestive of a flexion/distraction injury, and this fracture may be unstable. A thoracic spine MRI is recommended to assess for any associated ligamentous injury, and to exclude spinal cord injury. A subtle nondisplaced fracture through the right T12 superior articular process is also questioned. 3. Thoracic spondylosis, as outlined. No appreciable significant spinal canal stenosis. No high-grade bony neural foraminal narrowing. 4. Thoracic dextrocurvature. 5. Please refer to the concurrently performed CT chest/abdomen/pelvis for a description of intrathoracic and abdominopelvic soft tissue findings. CT LUMBAR SPINE IMPRESSION 1. No evidence of acute fracture to the lumbar spine. 2. Lumbar spondylosis, as described and greatest at L4-L5. At this level, there is suspected moderate central canal stenosis with bilateral subarticular narrowing, as well as moderate right neural foraminal narrowing. 3. Please refer to the concurrently performed CT chest/abdomen/pelvis for description of abdominopelvic soft tissue findings. Electronically Signed: By: Jackey Loge D.O. On: 07/09/2021 20:25   DG Hand Complete Right  Result Date: 07/09/2021 CLINICAL DATA:  Fall, deformity EXAM: RIGHT WRIST - COMPLETE 3+ VIEW; RIGHT FOREARM - 2 VIEW; RIGHT HAND - COMPLETE 3+ VIEW COMPARISON:  None. FINDINGS: Forearm: The comminuted and displaced distal radial fracture is assessed below. No acute fracture of the proximal radius or ulna is identified. There is lucency through an olecranon enthesophyte which may reflect a remote fracture. There is no significant overlying soft tissue swelling to suggest acute fracture. Elbow alignment appears maintained. Wrist: There is a  severely comminuted impacted fracture of the distal radius with intra-articular extension. There is mild dorsal displacement and angulation of the dominant distal fragment. There is a mildly displaced ulnar styloid fracture. Radiocarpal alignment is grossly intact. There is significant surrounding soft tissue swelling. Hand: No additional fracture is seen. Alignment of the bones of the hand is within normal limits. The joint spaces are preserved. There is a punctate radiopaque foreign body within the soft tissues of the index finger overlying the radial aspect of the index finger proximal phalanx. IMPRESSION: 1. Severely comminuted and mildly displaced impacted fracture of the distal radius with intra-articular extension. 2. Mildly displaced ulnar styloid fracture. 3. Punctate radiopaque foreign body within the soft tissues along the radial aspect of the index finger proximal phalanx. 4. Probable remote fracture through an olecranon enthesophyte. Electronically Signed   By: Lesia Hausen M.D.   On: 07/09/2021 15:42    Review of Systems Blood pressure 123/75, pulse (!) 57, temperature 97.9 F (36.6 C), temperature source Oral, resp. rate 16, height 5\' 11"  (1.803 m), weight 99.8 kg, SpO2 100 %. Physical Exam Constitutional:      Appearance: Normal appearance.  HENT:     Head: Normocephalic and atraumatic.     Right Ear: External ear normal.     Left Ear: External ear normal.     Nose: Nose normal.     Mouth/Throat:     Mouth: Mucous membranes are moist.     Pharynx: Oropharynx is clear.  Eyes:     Extraocular Movements: Extraocular movements intact.     Conjunctiva/sclera: Conjunctivae normal.     Pupils: Pupils are equal, round, and reactive to light.  Cardiovascular:     Rate and Rhythm: Normal rate and regular rhythm.  Pulmonary:     Effort: Pulmonary effort is normal.  Breath sounds: Normal breath sounds.  Abdominal:     General: Abdomen is flat.     Palpations: Abdomen is soft.   Musculoskeletal:        General: Tenderness present.     Cervical back: Normal range of motion.  Skin:    General: Skin is warm and dry.  Neurological:     General: No focal deficit present.     Mental Status: He is alert and oriented to person, place, and time.     Cranial Nerves: Cranial nerves are intact.     Sensory: Sensation is intact.     Motor: Motor function is intact.     Coordination: Coordination is intact.     Deep Tendon Reflexes: Reflexes are normal and symmetric. Babinski sign absent on the right side. Babinski sign absent on the left side.  Psychiatric:        Mood and Affect: Mood normal.        Behavior: Behavior normal.        Thought Content: Thought content normal.        Judgment: Judgment normal.    Assessment/Plan: Craig Church is a 55 y.o. male With multiple thoracic spine fractures. Recommend aspen lumbar brace with thoracic extension when out of bed. May don doff when sitting up.   Coletta Memos 07/10/2021, 9:01 PM

## 2021-07-10 NOTE — H&P (View-Only) (Signed)
Reason for Consult/CC: Right wrist fracture  Craig Church is an 55 y.o. male.  HPI: Patient presented to the emergency room yesterday after a fall from a ladder.  He sustained a right distal radius fracture and thoracic spine fractures.  He was reduced and splinted in the emergency room.  He is in the hospital now for neurosurgical evaluation and pain control.  Allergies:  Allergies  Allergen Reactions   Penicillins Other (See Comments)    Unknown childhood reaction    Medications:  Current Facility-Administered Medications:    0.9 %  sodium chloride infusion, 250 mL, Intravenous, PRN, Tsuei, Matthew, MD   acetaminophen (TYLENOL) tablet 1,000 mg, 1,000 mg, Oral, Q6H, Lovick, Ayesha N, MD, 1,000 mg at 07/10/21 1129   aspirin EC tablet 81 mg, 81 mg, Oral, QHS, Byerly, Faera, MD, 81 mg at 07/10/21 0152   atorvastatin (LIPITOR) tablet 10 mg, 10 mg, Oral, Q48H, Byerly, Faera, MD, 10 mg at 07/10/21 0152   diclofenac Sodium (VOLTAREN) 1 % topical gel 1 application, 1 application, Topical, Daily PRN, Byerly, Faera, MD   docusate sodium (COLACE) capsule 100 mg, 100 mg, Oral, BID, Byerly, Faera, MD, 100 mg at 07/10/21 1131   [START ON 07/11/2021] enoxaparin (LOVENOX) injection 30 mg, 30 mg, Subcutaneous, Q12H, Byerly, Faera, MD   gabapentin (NEURONTIN) capsule 800 mg, 800 mg, Oral, BID, Byerly, Faera, MD, 800 mg at 07/10/21 1129   hydrochlorothiazide (MICROZIDE) capsule 12.5 mg, 12.5 mg, Oral, Daily, Worley, Jonathan L, RPH, 12.5 mg at 07/10/21 1130   ketorolac (TORADOL) 15 MG/ML injection 30 mg, 30 mg, Intravenous, Q6H, Lovick, Ayesha N, MD, 30 mg at 07/10/21 1125   LORazepam (ATIVAN) injection 1 mg, 1 mg, Intravenous, Once, Yao, David Hsienta, MD   losartan (COZAAR) tablet 50 mg, 50 mg, Oral, Daily, Worley, Jonathan L, RPH   melatonin tablet 3 mg, 3 mg, Oral, QHS PRN, Byerly, Faera, MD   methocarbamol (ROBAXIN) tablet 1,000 mg, 1,000 mg, Oral, Q8H, Lovick, Ayesha N, MD   morphine 2 MG/ML injection  2-4 mg, 2-4 mg, Intravenous, Q2H PRN, Tsuei, Matthew, MD   ondansetron (ZOFRAN-ODT) disintegrating tablet 4 mg, 4 mg, Oral, Q6H PRN **OR** ondansetron (ZOFRAN) injection 4 mg, 4 mg, Intravenous, Q6H PRN, Byerly, Faera, MD, 4 mg at 07/10/21 0152   oxyCODONE (Oxy IR/ROXICODONE) immediate release tablet 5-10 mg, 5-10 mg, Oral, Q4H PRN, Lovick, Ayesha N, MD, 10 mg at 07/10/21 1241   pantoprazole (PROTONIX) EC tablet 40 mg, 40 mg, Oral, Daily, Byerly, Faera, MD, 40 mg at 07/10/21 1130   rOPINIRole (REQUIP) tablet 1 mg, 1 mg, Oral, BID, Byerly, Faera, MD, 1 mg at 07/10/21 1131   sodium chloride flush (NS) 0.9 % injection 3 mL, 3 mL, Intravenous, Q12H, Byerly, Faera, MD, 3 mL at 07/10/21 1133   sodium chloride flush (NS) 0.9 % injection 3 mL, 3 mL, Intravenous, PRN, Byerly, Faera, MD   topiramate (TOPAMAX) tablet 100 mg, 100 mg, Oral, BID, Byerly, Faera, MD, 100 mg at 07/10/21 1134   venlafaxine XR (EFFEXOR-XR) 24 hr capsule 75 mg, 75 mg, Oral, q morning, Byerly, Faera, MD  Past Medical History:  Diagnosis Date   Hypertension    Seizure disorder (HCC)     Past Surgical History:  Procedure Laterality Date   CHOLECYSTECTOMY      No family history on file.  Social History:  reports that he has never smoked. His smokeless tobacco use includes chew. He reports that he does not drink alcohol and does not use drugs.    Physical Exam Blood pressure 121/70, pulse 61, temperature 98 F (36.7 C), resp. rate 18, height 5' 11" (1.803 m), weight 99.8 kg, SpO2 100 %. General: Mild distress due to pain Right hand: Fingers well-perfused.  He can flex and extend his fingers and thumb.  All fingers and thumb have what appear to be normal sensation and normal capillary refill.  He is in a sugar-tong splint.  Results for orders placed or performed during the hospital encounter of 07/09/21 (from the past 48 hour(s))  CBC with Differential     Status: Abnormal   Collection Time: 07/09/21  4:30 PM  Result Value Ref  Range   WBC 12.0 (H) 4.0 - 10.5 K/uL   RBC 4.29 4.22 - 5.81 MIL/uL   Hemoglobin 11.5 (L) 13.0 - 17.0 g/dL   HCT 36.0 (L) 39.0 - 52.0 %   MCV 83.9 80.0 - 100.0 fL   MCH 26.8 26.0 - 34.0 pg   MCHC 31.9 30.0 - 36.0 g/dL   RDW 13.3 11.5 - 15.5 %   Platelets 294 150 - 400 K/uL   nRBC 0.0 0.0 - 0.2 %   Neutrophils Relative % 85 %   Neutro Abs 10.3 (H) 1.7 - 7.7 K/uL   Lymphocytes Relative 8 %   Lymphs Abs 0.9 0.7 - 4.0 K/uL   Monocytes Relative 5 %   Monocytes Absolute 0.6 0.1 - 1.0 K/uL   Eosinophils Relative 1 %   Eosinophils Absolute 0.1 0.0 - 0.5 K/uL   Basophils Relative 0 %   Basophils Absolute 0.0 0.0 - 0.1 K/uL   Immature Granulocytes 1 %   Abs Immature Granulocytes 0.09 (H) 0.00 - 0.07 K/uL    Comment: Performed at [facility], [address] [place], [place] [address]  Basic metabolic panel     Status: Abnormal   Collection Time: 07/09/21  4:30 PM  Result Value Ref Range   Sodium 138 135 - 145 mmol/L   Potassium 3.3 (L) 3.5 - 5.1 mmol/L   Chloride 109 98 - 111 mmol/L   CO2 21 (L) 22 - 32 mmol/L   Glucose, Bld 98 70 - 99 mg/dL    Comment: Glucose reference range applies only to samples taken after fasting for at least 8 hours.   BUN 20 6 - 20 mg/dL   Creatinine, Ser 1.07 0.61 - 1.24 mg/dL   Calcium 9.1 8.9 - 10.3 mg/dL   GFR, Estimated >60 >60 mL/min    Comment: (NOTE) Calculated using the CKD-EPI Creatinine Equation (2021)    Anion gap 8 5 - 15    Comment: Performed at [facility], [address] [place], [place] [address]  Protime-INR     Status: None   Collection Time: 07/09/21  4:30 PM  Result Value Ref Range   Prothrombin Time 12.4 11.4 - 15.2 seconds   INR 0.9 0.8 - 1.2    Comment: (NOTE) INR goal varies based on device and disease states. Performed at [facility], [address] [place], [place] [address]   SARS CORONAVIRUS 2 (TAT 6-24 HRS) Nasopharyngeal Nasopharyngeal Swab     Status: None   Collection Time: 07/10/21  3:51 AM    Specimen: Nasopharyngeal Swab  Result Value Ref Range   SARS Coronavirus 2 NEGATIVE NEGATIVE    Comment: (NOTE) SARS-CoV-2 target nucleic acids are NOT DETECTED.  The SARS-CoV-2 RNA is generally detectable in upper and lower respiratory specimens during the acute phase of infection. Negative results   do not preclude SARS-CoV-2 infection, do not rule out co-infections with other pathogens, and should not be used as the sole basis for treatment or other patient management decisions. Negative results must be combined with clinical observations, patient history, and epidemiological information. The expected result is Negative.  Fact Sheet for Patients: https://www.fda.gov/media/138098/download  Fact Sheet for Healthcare Providers: https://www.fda.gov/media/138095/download  This test is not yet approved or cleared by the [place] FDA and  has been authorized for detection and/or diagnosis of SARS-CoV-2 by FDA under an Emergency Use Authorization (EUA). This EUA will remain  in effect (meaning this test can be used) for the duration of the COVID-19 declaration under Se ction 564(b)(1) of the Act, 21 U.S.C. section 360bbb-3(b)(1), unless the authorization is terminated or revoked sooner.  Performed at [facility], [address] [place], [place] [address]   CBC     Status: Abnormal   Collection Time: 07/10/21  4:18 AM  Result Value Ref Range   WBC 8.0 4.0 - 10.5 K/uL   RBC 4.20 (L) 4.22 - 5.81 MIL/uL   Hemoglobin 11.2 (L) 13.0 - 17.0 g/dL   HCT 35.7 (L) 39.0 - 52.0 %   MCV 85.0 80.0 - 100.0 fL   MCH 26.7 26.0 - 34.0 pg   MCHC 31.4 30.0 - 36.0 g/dL   RDW 13.5 11.5 - 15.5 %   Platelets 261 150 - 400 K/uL   nRBC 0.0 0.0 - 0.2 %    Comment: Performed at [facility], [address] [place], [place] [address]  Basic metabolic panel     Status: Abnormal   Collection Time: 07/10/21  4:18 AM  Result Value Ref Range   Sodium 137 135 - 145 mmol/L   Potassium 3.8  3.5 - 5.1 mmol/L   Chloride 109 98 - 111 mmol/L   CO2 21 (L) 22 - 32 mmol/L   Glucose, Bld 105 (H) 70 - 99 mg/dL    Comment: Glucose reference range applies only to samples taken after fasting for at least 8 hours.   BUN 15 6 - 20 mg/dL   Creatinine, Ser 0.88 0.61 - 1.24 mg/dL   Calcium 8.6 (L) 8.9 - 10.3 mg/dL   GFR, Estimated >60 >60 mL/min    Comment: (NOTE) Calculated using the CKD-EPI Creatinine Equation (2021)    Anion gap 7 5 - 15    Comment: Performed at [facility], [address] [place], [place] [address]  HIV Antibody (routine testing w rflx)     Status: None   Collection Time: 07/10/21  5:44 AM  Result Value Ref Range   HIV Screen 4th Generation wRfx Non Reactive Non Reactive    Comment: Performed at [facility], [address] [place], [place] [address]  CBC     Status: Abnormal   Collection Time: 07/10/21  5:44 AM  Result Value Ref Range   WBC 8.2 4.0 - 10.5 K/uL   RBC 4.28 4.22 - 5.81 MIL/uL   Hemoglobin 11.7 (L) 13.0 - 17.0 g/dL   HCT 35.8 (L) 39.0 - 52.0 %   MCV 83.6 80.0 - 100.0 fL   MCH 27.3 26.0 - 34.0 pg   MCHC 32.7 30.0 - 36.0 g/dL   RDW 13.4 11.5 - 15.5 %   Platelets 277 150 - 400 K/uL   nRBC 0.0 0.0 - 0.2 %    Comment: Performed at  Hospital Lab, [address] [place], [place] [address]    Creatinine, serum     Status: None   Collection Time: 07/10/21  5:44 AM  Result Value Ref Range   Creatinine, Ser 0.85 0.61 - 1.24 mg/dL   GFR, Estimated >60 >60 mL/min    Comment: (NOTE) Calculated using the CKD-EPI Creatinine Equation (2021) Performed at [facility], [address] [place], [place] [address]     DG Forearm Right  Result Date: 07/09/2021 CLINICAL DATA:  Fall, deformity EXAM: RIGHT WRIST - COMPLETE 3+ VIEW; RIGHT FOREARM - 2 VIEW; RIGHT HAND - COMPLETE 3+ VIEW COMPARISON:  None. FINDINGS: Forearm: The comminuted and displaced distal radial fracture is assessed below. No acute fracture of the proximal radius or ulna  is identified. There is lucency through an olecranon enthesophyte which may reflect a remote fracture. There is no significant overlying soft tissue swelling to suggest acute fracture. Elbow alignment appears maintained. Wrist: There is a severely comminuted impacted fracture of the distal radius with intra-articular extension. There is mild dorsal displacement and angulation of the dominant distal fragment. There is a mildly displaced ulnar styloid fracture. Radiocarpal alignment is grossly intact. There is significant surrounding soft tissue swelling. Hand: No additional fracture is seen. Alignment of the bones of the hand is within normal limits. The joint spaces are preserved. There is a punctate radiopaque foreign body within the soft tissues of the index finger overlying the radial aspect of the index finger proximal phalanx. IMPRESSION: 1. Severely comminuted and mildly displaced impacted fracture of the distal radius with intra-articular extension. 2. Mildly displaced ulnar styloid fracture. 3. Punctate radiopaque foreign body within the soft tissues along the radial aspect of the index finger proximal phalanx. 4. Probable remote fracture through an olecranon enthesophyte. Electronically Signed   By: Peter  Noone M.D.   On: 07/09/2021 15:42   DG Wrist Complete Right  Result Date: 07/09/2021 CLINICAL DATA:  Right wrist reduction. EXAM: RIGHT WRIST - COMPLETE 3+ VIEW COMPARISON:  Pre reduction radiograph earlier today FINDINGS: Frontal and lateral views of the right wrist obtained with overlying splint material in place. Improved alignment of comminuted and displaced distal radius fracture with mild residual radial displacement. Unchanged alignment of ulnar styloid fracture. IMPRESSION: Improved alignment of comminuted distal radius fracture postreduction with mild residual radial displacement. Unchanged ulnar styloid fracture. Electronically Signed   By: Melanie  Sanford M.D.   On: 07/09/2021 20:15   DG  Wrist Complete Right  Result Date: 07/09/2021 CLINICAL DATA:  Fall, deformity EXAM: RIGHT WRIST - COMPLETE 3+ VIEW; RIGHT FOREARM - 2 VIEW; RIGHT HAND - COMPLETE 3+ VIEW COMPARISON:  None. FINDINGS: Forearm: The comminuted and displaced distal radial fracture is assessed below. No acute fracture of the proximal radius or ulna is identified. There is lucency through an olecranon enthesophyte which may reflect a remote fracture. There is no significant overlying soft tissue swelling to suggest acute fracture. Elbow alignment appears maintained. Wrist: There is a severely comminuted impacted fracture of the distal radius with intra-articular extension. There is mild dorsal displacement and angulation of the dominant distal fragment. There is a mildly displaced ulnar styloid fracture. Radiocarpal alignment is grossly intact. There is significant surrounding soft tissue swelling. Hand: No additional fracture is seen. Alignment of the bones of the hand is within normal limits. The joint spaces are preserved. There is a punctate radiopaque foreign body within the soft tissues of the index finger overlying the radial aspect of the index finger proximal phalanx. IMPRESSION: 1. Severely comminuted and mildly displaced impacted fracture of   the distal radius with intra-articular extension. 2. Mildly displaced ulnar styloid fracture. 3. Punctate radiopaque foreign body within the soft tissues along the radial aspect of the index finger proximal phalanx. 4. Probable remote fracture through an olecranon enthesophyte. Electronically Signed   By: Peter  Noone M.D.   On: 07/09/2021 15:42   CT Head Wo Contrast  Result Date: 07/09/2021 CLINICAL DATA:  Trauma, low back pain. EXAM: CT HEAD WITHOUT CONTRAST CT CERVICAL SPINE WITHOUT CONTRAST CT CHEST, ABDOMEN AND PELVIS WITH CONTRAST TECHNIQUE: Contiguous axial images were obtained from the base of the skull through the vertex without intravenous contrast. Multidetector CT imaging  of the cervical spine was performed without intravenous contrast. Multiplanar CT image reconstructions were also generated. Multidetector CT imaging of the chest, abdomen and pelvis was performed following the standard protocol during bolus administration of intravenous contrast. CONTRAST:  100mL OMNIPAQUE IOHEXOL 350 MG/ML SOLN COMPARISON:  None. FINDINGS: CT HEAD FINDINGS Brain: No evidence of large-territorial acute infarction. No parenchymal hemorrhage. 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 No extra-axial collection. No mass effect or midline shift. No hydrocephalus. Basilar cisterns are patent. Partial empty sella. Vascular: No hyperdense vessel. Skull: No acute fracture or focal lesion. Sinuses/Orbits: Paranasal sinuses and mastoid air cells are clear. The orbits are unremarkable. Other: None. CT CERVICAL FINDINGS Alignment: Normal. Skull base and vertebrae: Partial fusion of the C4-C5 levels. Multilevel degenerative changes of the spine. 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 No severe osseous central canal stenosis. No acute fracture. No aggressive appearing focal osseous lesion or focal pathologic process. Soft tissues and spinal canal: No prevertebral fluid or swelling. No visible canal hematoma. Upper chest: Unremarkable. Other: None. CT CHEST FINDINGS Ports and Devices: None. Lungs/airways: Bilateral lower lobe subsegmental atelectasis. No focal consolidation. There is a difficult to measure slightly spiculated 1.1 x 0.5 cm right lower lobe subpleural nodule (5:87, 7:108). Associated mild tethering of the pleura is noted. Few scattered calcified pulmonary micronodules within the left lung. No pulmonary mass. No pulmonary contusion or laceration. No pneumatocele formation. The central airways are patent. Pleura: No pleural effusion. No pneumothorax. No hemothorax. Lymph Nodes: No mediastinal,  hilar, or axillary lymphadenopathy. Mediastinum: No pneumomediastinum. Trace high density free fluid within the superior anterior mediastinum (3:21) just posterior to the manubrium. Normal fat plane between this fluid and the aorta is noted. No aortic injury. The thoracic aorta is normal in caliber. The heart is normal in size. No significant pericardial effusion. The esophagus is unremarkable. The thyroid is unremarkable. Chest Wall / Breasts: No chest wall mass. Musculoskeletal: No acute rib or sternal fracture. Please see separately dictated CT thoracic spine 07/09/2021. partially visualized right upper extremity with a partially visualized comminuted radial fracture. CT ABDOMEN AND PELVIS FINDINGS Liver: Not enlarged. No focal lesion. No laceration or subcapsular hematoma. Biliary System: Status post cholecystectomy. No biliary ductal dilatation. Pancreas: Normal pancreatic contour. No main pancreatic duct dilatation. Spleen: Not enlarged. No focal lesion. No laceration, subcapsular hematoma, or vascular injury. Adrenal Glands: No nodularity bilaterally. Kidneys: Bilateral kidneys enhance symmetrically. No hydronephrosis. No contusion, laceration, or subcapsular hematoma. No injury to the vascular structures or collecting systems. No hydroureter. The urinary bladder is unremarkable. On delayed imaging, there is no urothelial wall thickening and there are no filling defects in the opacified portions of the bilateral collecting systems or ureters. Bowel: No small or large bowel wall thickening or dilatation. Question appendiceal stump   with no findings of right lower quadrant inflammatory change Mesentery, Omentum, and Peritoneum: No simple free fluid ascites. No pneumoperitoneum. No hemoperitoneum. No mesenteric hematoma identified. No organized fluid collection. Pelvic Organs: Normal. Lymph Nodes: No abdominal, pelvic, inguinal lymphadenopathy. Vasculature: No abdominal aorta or iliac aneurysm. No active contrast  extravasation or pseudoaneurysm. Musculoskeletal: No significant soft tissue hematoma. Tiny fat containing umbilical hernia. No acute pelvic fracture. Please see separately dictated CT lumbar spine 07/09/2021. IMPRESSION: 1. No acute intracranial abnormality. 2. No acute displaced fracture or traumatic listhesis of the cervical spine. 3. Trace volume supero-anterior mediastinal hematoma posterior to the manubrium of unclear etiology. No associated aortic injury or findings to suggest aortic injury. No associated sternal/manubrial or costochondral junction fractures. 4. Otherwise no acute traumatic injury to the chest, abdomen, or pelvis. 5. Please see separately dictated CT thoracic and lumbar spine 07/09/2021. 6. Partially visualized distal comminuted right radial fracture. Other imaging findings of potential clinical significance: 1. 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 2. Partial empty sella. Findings is often a normal anatomic variant but can be associated with idiopathic intracranial hypertension (pseudotumor cerebri). 3. 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 4. A 0.8 cm left lower lobe pulmonary nodule. Non-contrast chest CT at 6-12 months is recommended. If the nodule is stable at time of repeat CT, then future CT at 18-24 months (from today's scan) is considered optional for low-risk patients, but is recommended for high-risk patients. This recommendation follows the consensus statement: Guidelines for Management of Incidental Pulmonary Nodules Detected on CT Images: From the Fleischner Society 2017; Radiology 2017; 284:228-243. Electronically Signed   By: Morgane  Naveau M.D.   On: 07/09/2021 20:40   CT Cervical Spine Wo Contrast  Result Date: 07/09/2021 CLINICAL DATA:  Trauma, low back pain. EXAM: CT HEAD WITHOUT CONTRAST CT CERVICAL SPINE WITHOUT CONTRAST CT CHEST, ABDOMEN AND PELVIS WITH CONTRAST TECHNIQUE: Contiguous axial  images were obtained from the base of the skull through the vertex without intravenous contrast. Multidetector CT imaging of the cervical spine was performed without intravenous contrast. Multiplanar CT image reconstructions were also generated. Multidetector CT imaging of the chest, abdomen and pelvis was performed following the standard protocol during bolus administration of intravenous contrast. CONTRAST:  100mL OMNIPAQUE IOHEXOL 350 MG/ML SOLN COMPARISON:  None. FINDINGS: CT HEAD FINDINGS Brain: No evidence of large-territorial acute infarction. No parenchymal hemorrhage. 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 No extra-axial collection. No mass effect or midline shift. No hydrocephalus. Basilar cisterns are patent. Partial empty sella. Vascular: No hyperdense vessel. Skull: No acute fracture or focal lesion. Sinuses/Orbits: Paranasal sinuses and mastoid air cells are clear. The orbits are unremarkable. Other: None. CT CERVICAL FINDINGS Alignment: Normal. Skull base and vertebrae: Partial fusion of the C4-C5 levels. Multilevel degenerative changes of the spine. 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 No severe osseous central canal stenosis. No acute fracture. No aggressive appearing focal osseous lesion or focal pathologic process. Soft tissues and spinal canal: No prevertebral fluid or swelling. No visible canal hematoma. Upper chest: Unremarkable. Other: None. CT CHEST FINDINGS Ports and Devices: None. Lungs/airways: Bilateral lower lobe subsegmental atelectasis. No focal consolidation. There is a difficult to measure slightly spiculated 1.1 x 0.5 cm right lower lobe subpleural nodule (5:87, 7:108). Associated mild tethering of the pleura is noted. Few scattered calcified pulmonary micronodules within the left lung.   No pulmonary mass. No pulmonary contusion or laceration. No pneumatocele formation. The  central airways are patent. Pleura: No pleural effusion. No pneumothorax. No hemothorax. Lymph Nodes: No mediastinal, hilar, or axillary lymphadenopathy. Mediastinum: No pneumomediastinum. Trace high density free fluid within the superior anterior mediastinum (3:21) just posterior to the manubrium. Normal fat plane between this fluid and the aorta is noted. No aortic injury. The thoracic aorta is normal in caliber. The heart is normal in size. No significant pericardial effusion. The esophagus is unremarkable. The thyroid is unremarkable. Chest Wall / Breasts: No chest wall mass. Musculoskeletal: No acute rib or sternal fracture. Please see separately dictated CT thoracic spine 07/09/2021. partially visualized right upper extremity with a partially visualized comminuted radial fracture. CT ABDOMEN AND PELVIS FINDINGS Liver: Not enlarged. No focal lesion. No laceration or subcapsular hematoma. Biliary System: Status post cholecystectomy. No biliary ductal dilatation. Pancreas: Normal pancreatic contour. No main pancreatic duct dilatation. Spleen: Not enlarged. No focal lesion. No laceration, subcapsular hematoma, or vascular injury. Adrenal Glands: No nodularity bilaterally. Kidneys: Bilateral kidneys enhance symmetrically. No hydronephrosis. No contusion, laceration, or subcapsular hematoma. No injury to the vascular structures or collecting systems. No hydroureter. The urinary bladder is unremarkable. On delayed imaging, there is no urothelial wall thickening and there are no filling defects in the opacified portions of the bilateral collecting systems or ureters. Bowel: No small or large bowel wall thickening or dilatation. Question appendiceal stump with no findings of right lower quadrant inflammatory change Mesentery, Omentum, and Peritoneum: No simple free fluid ascites. No pneumoperitoneum. No hemoperitoneum. No mesenteric hematoma identified. No organized fluid collection. Pelvic Organs: Normal. Lymph Nodes:  No abdominal, pelvic, inguinal lymphadenopathy. Vasculature: No abdominal aorta or iliac aneurysm. No active contrast extravasation or pseudoaneurysm. Musculoskeletal: No significant soft tissue hematoma. Tiny fat containing umbilical hernia. No acute pelvic fracture. Please see separately dictated CT lumbar spine 07/09/2021. IMPRESSION: 1. No acute intracranial abnormality. 2. No acute displaced fracture or traumatic listhesis of the cervical spine. 3. Trace volume supero-anterior mediastinal hematoma posterior to the manubrium of unclear etiology. No associated aortic injury or findings to suggest aortic injury. No associated sternal/manubrial or costochondral junction fractures. 4. Otherwise no acute traumatic injury to the chest, abdomen, or pelvis. 5. Please see separately dictated CT thoracic and lumbar spine 07/09/2021. 6. Partially visualized distal comminuted right radial fracture. Other imaging findings of potential clinical significance: 1. 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 2. Partial empty sella. Findings is often a normal anatomic variant but can be associated with idiopathic intracranial hypertension (pseudotumor cerebri). 3. 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 4. A 0.8 cm left lower lobe pulmonary nodule. Non-contrast chest CT at 6-12 months is recommended. If the nodule is stable at time of repeat CT, then future CT at 18-24 months (from today's scan) is considered optional for low-risk patients, but is recommended for high-risk patients. This recommendation follows the consensus statement: Guidelines for Management of Incidental Pulmonary Nodules Detected on CT Images: From the Fleischner Society 2017; Radiology 2017; 284:228-243. Electronically Signed   By: Morgane  Naveau M.D.   On: 07/09/2021 20:40   CT CHEST ABDOMEN PELVIS W CONTRAST  Result Date: 07/09/2021 CLINICAL DATA:  Trauma, low back pain. EXAM: CT HEAD  WITHOUT CONTRAST CT CERVICAL SPINE WITHOUT CONTRAST CT CHEST, ABDOMEN AND PELVIS WITH CONTRAST TECHNIQUE: Contiguous axial images were obtained from the base of the skull through the vertex without intravenous contrast. Multidetector   CT imaging of the cervical spine was performed without intravenous contrast. Multiplanar CT image reconstructions were also generated. Multidetector CT imaging of the chest, abdomen and pelvis was performed following the standard protocol during bolus administration of intravenous contrast. CONTRAST:  100mL OMNIPAQUE IOHEXOL 350 MG/ML SOLN COMPARISON:  None. FINDINGS: CT HEAD FINDINGS Brain: No evidence of large-territorial acute infarction. No parenchymal hemorrhage. 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 No extra-axial collection. No mass effect or midline shift. No hydrocephalus. Basilar cisterns are patent. Partial empty sella. Vascular: No hyperdense vessel. Skull: No acute fracture or focal lesion. Sinuses/Orbits: Paranasal sinuses and mastoid air cells are clear. The orbits are unremarkable. Other: None. CT CERVICAL FINDINGS Alignment: Normal. Skull base and vertebrae: Partial fusion of the C4-C5 levels. Multilevel degenerative changes of the spine. 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 No severe osseous central canal stenosis. No acute fracture. No aggressive appearing focal osseous lesion or focal pathologic process. Soft tissues and spinal canal: No prevertebral fluid or swelling. No visible canal hematoma. Upper chest: Unremarkable. Other: None. CT CHEST FINDINGS Ports and Devices: None. Lungs/airways: Bilateral lower lobe subsegmental atelectasis. No focal consolidation. There is a difficult to measure slightly spiculated 1.1 x 0.5 cm right lower lobe subpleural nodule (5:87, 7:108). Associated mild tethering of the pleura is noted. Few scattered calcified pulmonary  micronodules within the left lung. No pulmonary mass. No pulmonary contusion or laceration. No pneumatocele formation. The central airways are patent. Pleura: No pleural effusion. No pneumothorax. No hemothorax. Lymph Nodes: No mediastinal, hilar, or axillary lymphadenopathy. Mediastinum: No pneumomediastinum. Trace high density free fluid within the superior anterior mediastinum (3:21) just posterior to the manubrium. Normal fat plane between this fluid and the aorta is noted. No aortic injury. The thoracic aorta is normal in caliber. The heart is normal in size. No significant pericardial effusion. The esophagus is unremarkable. The thyroid is unremarkable. Chest Wall / Breasts: No chest wall mass. Musculoskeletal: No acute rib or sternal fracture. Please see separately dictated CT thoracic spine 07/09/2021. partially visualized right upper extremity with a partially visualized comminuted radial fracture. CT ABDOMEN AND PELVIS FINDINGS Liver: Not enlarged. No focal lesion. No laceration or subcapsular hematoma. Biliary System: Status post cholecystectomy. No biliary ductal dilatation. Pancreas: Normal pancreatic contour. No main pancreatic duct dilatation. Spleen: Not enlarged. No focal lesion. No laceration, subcapsular hematoma, or vascular injury. Adrenal Glands: No nodularity bilaterally. Kidneys: Bilateral kidneys enhance symmetrically. No hydronephrosis. No contusion, laceration, or subcapsular hematoma. No injury to the vascular structures or collecting systems. No hydroureter. The urinary bladder is unremarkable. On delayed imaging, there is no urothelial wall thickening and there are no filling defects in the opacified portions of the bilateral collecting systems or ureters. Bowel: No small or large bowel wall thickening or dilatation. Question appendiceal stump with no findings of right lower quadrant inflammatory change Mesentery, Omentum, and Peritoneum: No simple free fluid ascites. No  pneumoperitoneum. No hemoperitoneum. No mesenteric hematoma identified. No organized fluid collection. Pelvic Organs: Normal. Lymph Nodes: No abdominal, pelvic, inguinal lymphadenopathy. Vasculature: No abdominal aorta or iliac aneurysm. No active contrast extravasation or pseudoaneurysm. Musculoskeletal: No significant soft tissue hematoma. Tiny fat containing umbilical hernia. No acute pelvic fracture. Please see separately dictated CT lumbar spine 07/09/2021. IMPRESSION: 1. No acute intracranial abnormality. 2. No acute displaced fracture or traumatic listhesis of the cervical spine. 3. Trace volume supero-anterior mediastinal hematoma posterior to the manubrium of unclear   etiology. No associated aortic injury or findings to suggest aortic injury. No associated sternal/manubrial or costochondral junction fractures. 4. Otherwise no acute traumatic injury to the chest, abdomen, or pelvis. 5. Please see separately dictated CT thoracic and lumbar spine 07/09/2021. 6. Partially visualized distal comminuted right radial fracture. Other imaging findings of potential clinical significance: 1. 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 2. Partial empty sella. Findings is often a normal anatomic variant but can be associated with idiopathic intracranial hypertension (pseudotumor cerebri). 3. 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 4. A 0.8 cm left lower lobe pulmonary nodule. Non-contrast chest CT at 6-12 months is recommended. If the nodule is stable at time of repeat CT, then future CT at 18-24 months (from today's scan) is considered optional for low-risk patients, but is recommended for high-risk patients. This recommendation follows the consensus statement: Guidelines for Management of Incidental Pulmonary Nodules Detected on CT Images: From the Fleischner Society 2017; Radiology 2017; 284:228-243. Electronically Signed   By: Morgane  Naveau M.D.    On: 07/09/2021 20:40   CT T-SPINE NO CHARGE  Addendum Date: 07/09/2021   ADDENDUM REPORT: 07/09/2021 21:12 ADDENDUM: CT thoracic spine impression #2 discussed with Dr. Yao by telephone at 8:55 p.m. on 07/09/2021. Electronically Signed   By: Kyle  Golden D.O.   On: 07/09/2021 21:12   Result Date: 07/09/2021 CLINICAL DATA:  Trauma. EXAM: CT THORACIC AND LUMBAR SPINE WITHOUT CONTRAST TECHNIQUE: Multidetector CT imaging of the thoracic and lumbar spine was performed without contrast. Multiplanar CT image reconstructions were also generated. COMPARISON:  None. FINDINGS: CT THORACIC SPINE FINDINGS In correlating with the concurrently performed CT of the cervical spine, there appear to be bilateral cervical ribs at the C7 level. Alignment: Cervical dextrocurvature. No significant spondylolisthesis. Vertebrae: There is mild anterior wedging of the T11 vertebral body. Additionally, there is a mild T12 superior endplate compression deformity (less than 20% height loss). There is a transversely oriented fracture traversing the T11 spinous process and bilateral T11 inferior articular processes. This is suggestive of a flexion/distraction injury mechanism, and this fracture may be unstable. A subtle nondisplaced fracture is also questioned through the right T12 superior articular process (series 504, image 38). Multilevel degenerative endplate irregularity with Schmorl nodes. Paraspinal and other soft tissues: Please refer to the concurrently performed CT chest/abdomen/pelvis for a description of intrathoracic and abdominopelvic soft tissue findings. Disc levels: Moderate disc space narrowing throughout the thoracic spine. No appreciable significant spinal canal stenosis. No high-grade bony neural foraminal narrowing. CT LUMBAR SPINE FINDINGS Segmentation: 5 lumbar vertebrae. The caudal most well-formed intervertebral disc space is designated L5-S1. Alignment: No significant spondylolisthesis. Vertebrae: Vertebral body  height is maintained. No evidence of acute fracture to the lumbar spine. Multilevel degenerative endplate irregularity. Paraspinal and other soft tissues: The concurrently performed CT chest/abdomen/pelvis for description of abdominopelvic soft tissue findings. Disc levels: No more than mild disc space narrowing at any level. Lumbar spondylosis with level by level findings most notably as follows. L4-L5: Disc bulge with mild endplate spurring. Facet arthrosis and ligamentum flavum hypertrophy. Suspected moderate central canal stenosis with bilateral subarticular narrowing. Moderate right neural foraminal narrowing. IMPRESSION: CT THORACIC SPINE IMPRESSION 1. Please note cervical ribs are present bilaterally at the C7 level. 2. Acute transversely oriented fracture traversing the T11 spinous process and bilateral T11 inferior articular processes. Additionally, there is a mild T12 superior endplate compression deformity, likely acute. Mild anterior wedging of the T11   vertebra, also possibly acute. The constellation of findings is suggestive of a flexion/distraction injury, and this fracture may be unstable. A thoracic spine MRI is recommended to assess for any associated ligamentous injury, and to exclude spinal cord injury. A subtle nondisplaced fracture through the right T12 superior articular process is also questioned. 3. Thoracic spondylosis, as outlined. No appreciable significant spinal canal stenosis. No high-grade bony neural foraminal narrowing. 4. Thoracic dextrocurvature. 5. Please refer to the concurrently performed CT chest/abdomen/pelvis for a description of intrathoracic and abdominopelvic soft tissue findings. CT LUMBAR SPINE IMPRESSION 1. No evidence of acute fracture to the lumbar spine. 2. Lumbar spondylosis, as described and greatest at L4-L5. At this level, there is suspected moderate central canal stenosis with bilateral subarticular narrowing, as well as moderate right neural foraminal  narrowing. 3. Please refer to the concurrently performed CT chest/abdomen/pelvis for description of abdominopelvic soft tissue findings. Electronically Signed: By: Kyle  Golden D.O. On: 07/09/2021 20:25   CT L-SPINE NO CHARGE  Addendum Date: 07/09/2021   ADDENDUM REPORT: 07/09/2021 21:12 ADDENDUM: CT thoracic spine impression #2 discussed with Dr. Yao by telephone at 8:55 p.m. on 07/09/2021. Electronically Signed   By: Kyle  Golden D.O.   On: 07/09/2021 21:12   Result Date: 07/09/2021 CLINICAL DATA:  Trauma. EXAM: CT THORACIC AND LUMBAR SPINE WITHOUT CONTRAST TECHNIQUE: Multidetector CT imaging of the thoracic and lumbar spine was performed without contrast. Multiplanar CT image reconstructions were also generated. COMPARISON:  None. FINDINGS: CT THORACIC SPINE FINDINGS In correlating with the concurrently performed CT of the cervical spine, there appear to be bilateral cervical ribs at the C7 level. Alignment: Cervical dextrocurvature. No significant spondylolisthesis. Vertebrae: There is mild anterior wedging of the T11 vertebral body. Additionally, there is a mild T12 superior endplate compression deformity (less than 20% height loss). There is a transversely oriented fracture traversing the T11 spinous process and bilateral T11 inferior articular processes. This is suggestive of a flexion/distraction injury mechanism, and this fracture may be unstable. A subtle nondisplaced fracture is also questioned through the right T12 superior articular process (series 504, image 38). Multilevel degenerative endplate irregularity with Schmorl nodes. Paraspinal and other soft tissues: Please refer to the concurrently performed CT chest/abdomen/pelvis for a description of intrathoracic and abdominopelvic soft tissue findings. Disc levels: Moderate disc space narrowing throughout the thoracic spine. No appreciable significant spinal canal stenosis. No high-grade bony neural foraminal narrowing. CT LUMBAR SPINE FINDINGS  Segmentation: 5 lumbar vertebrae. The caudal most well-formed intervertebral disc space is designated L5-S1. Alignment: No significant spondylolisthesis. Vertebrae: Vertebral body height is maintained. No evidence of acute fracture to the lumbar spine. Multilevel degenerative endplate irregularity. Paraspinal and other soft tissues: The concurrently performed CT chest/abdomen/pelvis for description of abdominopelvic soft tissue findings. Disc levels: No more than mild disc space narrowing at any level. Lumbar spondylosis with level by level findings most notably as follows. L4-L5: Disc bulge with mild endplate spurring. Facet arthrosis and ligamentum flavum hypertrophy. Suspected moderate central canal stenosis with bilateral subarticular narrowing. Moderate right neural foraminal narrowing. IMPRESSION: CT THORACIC SPINE IMPRESSION 1. Please note cervical ribs are present bilaterally at the C7 level. 2. Acute transversely oriented fracture traversing the T11 spinous process and bilateral T11 inferior articular processes. Additionally, there is a mild T12 superior endplate compression deformity, likely acute. Mild anterior wedging of the T11 vertebra, also possibly acute. The constellation of findings is suggestive of a flexion/distraction injury, and this fracture may be unstable. A thoracic spine MRI is recommended to assess   for any associated ligamentous injury, and to exclude spinal cord injury. A subtle nondisplaced fracture through the right T12 superior articular process is also questioned. 3. Thoracic spondylosis, as outlined. No appreciable significant spinal canal stenosis. No high-grade bony neural foraminal narrowing. 4. Thoracic dextrocurvature. 5. Please refer to the concurrently performed CT chest/abdomen/pelvis for a description of intrathoracic and abdominopelvic soft tissue findings. CT LUMBAR SPINE IMPRESSION 1. No evidence of acute fracture to the lumbar spine. 2. Lumbar spondylosis, as described  and greatest at L4-L5. At this level, there is suspected moderate central canal stenosis with bilateral subarticular narrowing, as well as moderate right neural foraminal narrowing. 3. Please refer to the concurrently performed CT chest/abdomen/pelvis for description of abdominopelvic soft tissue findings. Electronically Signed: By: Kyle  Golden D.O. On: 07/09/2021 20:25   DG Hand Complete Right  Result Date: 07/09/2021 CLINICAL DATA:  Fall, deformity EXAM: RIGHT WRIST - COMPLETE 3+ VIEW; RIGHT FOREARM - 2 VIEW; RIGHT HAND - COMPLETE 3+ VIEW COMPARISON:  None. FINDINGS: Forearm: The comminuted and displaced distal radial fracture is assessed below. No acute fracture of the proximal radius or ulna is identified. There is lucency through an olecranon enthesophyte which may reflect a remote fracture. There is no significant overlying soft tissue swelling to suggest acute fracture. Elbow alignment appears maintained. Wrist: There is a severely comminuted impacted fracture of the distal radius with intra-articular extension. There is mild dorsal displacement and angulation of the dominant distal fragment. There is a mildly displaced ulnar styloid fracture. Radiocarpal alignment is grossly intact. There is significant surrounding soft tissue swelling. Hand: No additional fracture is seen. Alignment of the bones of the hand is within normal limits. The joint spaces are preserved. There is a punctate radiopaque foreign body within the soft tissues of the index finger overlying the radial aspect of the index finger proximal phalanx. IMPRESSION: 1. Severely comminuted and mildly displaced impacted fracture of the distal radius with intra-articular extension. 2. Mildly displaced ulnar styloid fracture. 3. Punctate radiopaque foreign body within the soft tissues along the radial aspect of the index finger proximal phalanx. 4. Probable remote fracture through an olecranon enthesophyte. Electronically Signed   By: Peter   Noone M.D.   On: 07/09/2021 15:42    Assessment/Plan: Patient presents with a displaced comminuted distal radius fracture.  This was reduced in the emergency room with some improvement in the alignment but still is rather displaced.  I recommended open reduction internal fixation with a volar plate.  We discussed the risks include bleeding, infection, damage to surrounding structures need for additional procedures.  We discussed bony complications including nonunion, malunion and hardware complications.  I discussed the potential need for dorsal bridge plate should the fixation be inadequate volarly.  Tentative plan is to do this Monday afternoon pending other valuations and his overall progress.  Call with any questions.  Eldena Dede S Latravia [place]gate 07/10/2021, 12:53 PM      

## 2021-07-10 NOTE — Progress Notes (Signed)
Subjective/Chief Complaint: Awaiting Neurosurgery/ Hand Surgery evaluation Still at log roll Some bradycardia with Dilaudid   Objective: Vital signs in last 24 hours: Temp:  [97 F (36.1 C)-98.1 F (36.7 C)] 98 F (36.7 C) (09/24 0839) Pulse Rate:  [51-70] 55 (09/24 0839) Resp:  [9-20] 18 (09/24 0839) BP: (104-170)/(60-81) 114/68 (09/24 0839) SpO2:  [91 %-100 %] 100 % (09/24 0839) Weight:  [99.8 kg] 99.8 kg (09/23 1427) Last BM Date: 07/09/21  Intake/Output from previous day: 09/23 0701 - 09/24 0700 In: -  Out: 500 [Urine:500] Intake/Output this shift: No intake/output data recorded.  Constitutional:  WDWN in NAD, conversant, no obvious deformities; lying in bed in reverse Trendelenburg Eyes:  Pupils equal, round; sclera anicteric; moist conjunctiva; no lid lag HENT:  Oral mucosa moist; good dentition  Neck:  No masses palpated, trachea midline; no thyromegaly Lungs:  CTA bilaterally; normal respiratory effort CV:  Regular rate and rhythm; no murmurs; extremities well-perfused with no edema Abd:  +bowel sounds, soft, non-tender, no palpable organomegaly; no palpable hernias Musc:  Right forearm/ wrist in splint - moving upper extremity including fingers Moving lower extremities Lymphatic:  No palpable cervical or axillary lymphadenopathy Skin:  Warm, dry; no sign of jaundice Psychiatric - alert and oriented x 4; calm mood and affect  Lab Results:  Recent Labs    07/10/21 0418 07/10/21 0544  WBC 8.0 8.2  HGB 11.2* 11.7*  HCT 35.7* 35.8*  PLT 261 277   BMET Recent Labs    07/09/21 1630 07/10/21 0418 07/10/21 0544  NA 138 137  --   K 3.3* 3.8  --   CL 109 109  --   CO2 21* 21*  --   GLUCOSE 98 105*  --   BUN 20 15  --   CREATININE 1.07 0.88 0.85  CALCIUM 9.1 8.6*  --    PT/INR Recent Labs    07/09/21 1630  LABPROT 12.4  INR 0.9   ABG No results for input(s): PHART, HCO3 in the last 72 hours.  Invalid input(s): PCO2,  PO2  Studies/Results: DG Forearm Right  Result Date: 07/09/2021 CLINICAL DATA:  Fall, deformity EXAM: RIGHT WRIST - COMPLETE 3+ VIEW; RIGHT FOREARM - 2 VIEW; RIGHT HAND - COMPLETE 3+ VIEW COMPARISON:  None. FINDINGS: Forearm: The comminuted and displaced distal radial fracture is assessed below. No acute fracture of the proximal radius or ulna is identified. There is lucency through an olecranon enthesophyte which may reflect a remote fracture. There is no significant overlying soft tissue swelling to suggest acute fracture. Elbow alignment appears maintained. Wrist: There is a severely comminuted impacted fracture of the distal radius with intra-articular extension. There is mild dorsal displacement and angulation of the dominant distal fragment. There is a mildly displaced ulnar styloid fracture. Radiocarpal alignment is grossly intact. There is significant surrounding soft tissue swelling. Hand: No additional fracture is seen. Alignment of the bones of the hand is within normal limits. The joint spaces are preserved. There is a punctate radiopaque foreign body within the soft tissues of the index finger overlying the radial aspect of the index finger proximal phalanx. IMPRESSION: 1. Severely comminuted and mildly displaced impacted fracture of the distal radius with intra-articular extension. 2. Mildly displaced ulnar styloid fracture. 3. Punctate radiopaque foreign body within the soft tissues along the radial aspect of the index finger proximal phalanx. 4. Probable remote fracture through an olecranon enthesophyte. Electronically Signed   By: Lesia Hausen M.D.   On: 07/09/2021 15:42  DG Wrist Complete Right  Result Date: 07/09/2021 CLINICAL DATA:  Right wrist reduction. EXAM: RIGHT WRIST - COMPLETE 3+ VIEW COMPARISON:  Pre reduction radiograph earlier today FINDINGS: Frontal and lateral views of the right wrist obtained with overlying splint material in place. Improved alignment of comminuted and  displaced distal radius fracture with mild residual radial displacement. Unchanged alignment of ulnar styloid fracture. IMPRESSION: Improved alignment of comminuted distal radius fracture postreduction with mild residual radial displacement. Unchanged ulnar styloid fracture. Electronically Signed   By: Narda Rutherford M.D.   On: 07/09/2021 20:15   DG Wrist Complete Right  Result Date: 07/09/2021 CLINICAL DATA:  Fall, deformity EXAM: RIGHT WRIST - COMPLETE 3+ VIEW; RIGHT FOREARM - 2 VIEW; RIGHT HAND - COMPLETE 3+ VIEW COMPARISON:  None. FINDINGS: Forearm: The comminuted and displaced distal radial fracture is assessed below. No acute fracture of the proximal radius or ulna is identified. There is lucency through an olecranon enthesophyte which may reflect a remote fracture. There is no significant overlying soft tissue swelling to suggest acute fracture. Elbow alignment appears maintained. Wrist: There is a severely comminuted impacted fracture of the distal radius with intra-articular extension. There is mild dorsal displacement and angulation of the dominant distal fragment. There is a mildly displaced ulnar styloid fracture. Radiocarpal alignment is grossly intact. There is significant surrounding soft tissue swelling. Hand: No additional fracture is seen. Alignment of the bones of the hand is within normal limits. The joint spaces are preserved. There is a punctate radiopaque foreign body within the soft tissues of the index finger overlying the radial aspect of the index finger proximal phalanx. IMPRESSION: 1. Severely comminuted and mildly displaced impacted fracture of the distal radius with intra-articular extension. 2. Mildly displaced ulnar styloid fracture. 3. Punctate radiopaque foreign body within the soft tissues along the radial aspect of the index finger proximal phalanx. 4. Probable remote fracture through an olecranon enthesophyte. Electronically Signed   By: Lesia Hausen M.D.   On: 07/09/2021  15:42   CT Head Wo Contrast  Result Date: 07/09/2021 CLINICAL DATA:  Trauma, low back pain. EXAM: CT HEAD WITHOUT CONTRAST CT CERVICAL SPINE WITHOUT CONTRAST CT CHEST, ABDOMEN AND PELVIS WITH CONTRAST TECHNIQUE: Contiguous axial images were obtained from the base of the skull through the vertex without intravenous contrast. Multidetector CT imaging of the cervical spine was performed without intravenous contrast. Multiplanar CT image reconstructions were also generated. Multidetector CT imaging of the chest, abdomen and pelvis was performed following the standard protocol during bolus administration of intravenous contrast. CONTRAST:  OMNIPAQUE IOHEXOL 350 MG/ML SOLN COMPARISON:  None. FINDINGS: CT HEAD FINDINGS Brain: No evidence of large-territorial acute infarction. No parenchymal hemorrhage. 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 No extra-axial collection. No mass effect or midline shift. No hydrocephalus. Basilar cisterns are patent. Partial empty sella. Vascular: No hyperdense vessel. Skull: No acute fracture or focal lesion. Sinuses/Orbits: Paranasal sinuses and mastoid air cells are clear. The orbits are unremarkable. Other: None. CT CERVICAL FINDINGS Alignment: Normal. Skull base and vertebrae: Partial fusion of the C4-C5 levels. Multilevel degenerative changes of the spine. 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 No severe osseous central canal stenosis. No acute fracture. No aggressive appearing focal osseous lesion or focal pathologic process. Soft tissues and spinal canal: No prevertebral fluid or swelling. No visible canal hematoma. Upper chest: Unremarkable. Other: None. CT CHEST FINDINGS Ports and Devices: None. Lungs/airways: Bilateral  lower lobe subsegmental atelectasis. No focal consolidation. There is a difficult to measure slightly spiculated 1.1 x 0.5 cm right lower lobe  subpleural nodule (5:87, 7:108). Associated mild tethering of the pleura is noted. Few scattered calcified pulmonary micronodules within the left lung. No pulmonary mass. No pulmonary contusion or laceration. No pneumatocele formation. The central airways are patent. Pleura: No pleural effusion. No pneumothorax. No hemothorax. Lymph Nodes: No mediastinal, hilar, or axillary lymphadenopathy. Mediastinum: No pneumomediastinum. Trace high density free fluid within the superior anterior mediastinum (3:21) just posterior to the manubrium. Normal fat plane between this fluid and the aorta is noted. No aortic injury. The thoracic aorta is normal in caliber. The heart is normal in size. No significant pericardial effusion. The esophagus is unremarkable. The thyroid is unremarkable. Chest Wall / Breasts: No chest wall mass. Musculoskeletal: No acute rib or sternal fracture. Please see separately dictated CT thoracic spine 07/09/2021. partially visualized right upper extremity with a partially visualized comminuted radial fracture. CT ABDOMEN AND PELVIS FINDINGS Liver: Not enlarged. No focal lesion. No laceration or subcapsular hematoma. Biliary System: Status post cholecystectomy. No biliary ductal dilatation. Pancreas: Normal pancreatic contour. No main pancreatic duct dilatation. Spleen: Not enlarged. No focal lesion. No laceration, subcapsular hematoma, or vascular injury. Adrenal Glands: No nodularity bilaterally. Kidneys: Bilateral kidneys enhance symmetrically. No hydronephrosis. No contusion, laceration, or subcapsular hematoma. No injury to the vascular structures or collecting systems. No hydroureter. The urinary bladder is unremarkable. On delayed imaging, there is no urothelial wall thickening and there are no filling defects in the opacified portions of the bilateral collecting systems or ureters. Bowel: No small or large bowel wall thickening or dilatation. Question appendiceal stump with no findings of right  lower quadrant inflammatory change Mesentery, Omentum, and Peritoneum: No simple free fluid ascites. No pneumoperitoneum. No hemoperitoneum. No mesenteric hematoma identified. No organized fluid collection. Pelvic Organs: Normal. Lymph Nodes: No abdominal, pelvic, inguinal lymphadenopathy. Vasculature: No abdominal aorta or iliac aneurysm. No active contrast extravasation or pseudoaneurysm. Musculoskeletal: No significant soft tissue hematoma. Tiny fat containing umbilical hernia. No acute pelvic fracture. Please see separately dictated CT lumbar spine 07/09/2021. IMPRESSION: 1. No acute intracranial abnormality. 2. No acute displaced fracture or traumatic listhesis of the cervical spine. 3. Trace volume supero-anterior mediastinal hematoma posterior to the manubrium of unclear etiology. No associated aortic injury or findings to suggest aortic injury. No associated sternal/manubrial or costochondral junction fractures. 4. Otherwise no acute traumatic injury to the chest, abdomen, or pelvis. 5. Please see separately dictated CT thoracic and lumbar spine 07/09/2021. 6. Partially visualized distal comminuted right radial fracture. Other imaging findings of potential clinical significance: 1. 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 2. Partial empty sella. Findings is often a normal anatomic variant but can be associated with idiopathic intracranial hypertension (pseudotumor cerebri). 3. 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 4. A 0.8 cm left lower lobe pulmonary nodule. Non-contrast chest CT at 6-12 months is recommended. If the nodule is stable at time of repeat CT, then future CT at 18-24 months (from today's scan) is considered optional for low-risk patients, but is recommended for high-risk patients. This recommendation follows the consensus statement: Guidelines for Management of Incidental Pulmonary Nodules Detected on CT Images: From the  Fleischner Society 2017; Radiology 2017; 284:228-243. Electronically Signed   By: Tish Frederickson M.D.   On: 07/09/2021 20:40   CT Cervical Spine Wo Contrast  Result Date: 07/09/2021 CLINICAL  DATA:  Trauma, low back pain. EXAM: CT HEAD WITHOUT CONTRAST CT CERVICAL SPINE WITHOUT CONTRAST CT CHEST, ABDOMEN AND PELVIS WITH CONTRAST TECHNIQUE: Contiguous axial images were obtained from the base of the skull through the vertex without intravenous contrast. Multidetector CT imaging of the cervical spine was performed without intravenous contrast. Multiplanar CT image reconstructions were also generated. Multidetector CT imaging of the chest, abdomen and pelvis was performed following the standard protocol during bolus administration of intravenous contrast. CONTRAST:  OMNIPAQUE IOHEXOL 350 MG/ML SOLN COMPARISON:  None. FINDINGS: CT HEAD FINDINGS Brain: No evidence of large-territorial acute infarction. No parenchymal hemorrhage. 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 No extra-axial collection. No mass effect or midline shift. No hydrocephalus. Basilar cisterns are patent. Partial empty sella. Vascular: No hyperdense vessel. Skull: No acute fracture or focal lesion. Sinuses/Orbits: Paranasal sinuses and mastoid air cells are clear. The orbits are unremarkable. Other: None. CT CERVICAL FINDINGS Alignment: Normal. Skull base and vertebrae: Partial fusion of the C4-C5 levels. Multilevel degenerative changes of the spine. 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 No severe osseous central canal stenosis. No acute fracture. No aggressive appearing focal osseous lesion or focal pathologic process. Soft tissues and spinal canal: No prevertebral fluid or swelling. No visible canal hematoma. Upper chest: Unremarkable. Other: None. CT CHEST FINDINGS Ports and Devices: None. Lungs/airways: Bilateral lower lobe  subsegmental atelectasis. No focal consolidation. There is a difficult to measure slightly spiculated 1.1 x 0.5 cm right lower lobe subpleural nodule (5:87, 7:108). Associated mild tethering of the pleura is noted. Few scattered calcified pulmonary micronodules within the left lung. No pulmonary mass. No pulmonary contusion or laceration. No pneumatocele formation. The central airways are patent. Pleura: No pleural effusion. No pneumothorax. No hemothorax. Lymph Nodes: No mediastinal, hilar, or axillary lymphadenopathy. Mediastinum: No pneumomediastinum. Trace high density free fluid within the superior anterior mediastinum (3:21) just posterior to the manubrium. Normal fat plane between this fluid and the aorta is noted. No aortic injury. The thoracic aorta is normal in caliber. The heart is normal in size. No significant pericardial effusion. The esophagus is unremarkable. The thyroid is unremarkable. Chest Wall / Breasts: No chest wall mass. Musculoskeletal: No acute rib or sternal fracture. Please see separately dictated CT thoracic spine 07/09/2021. partially visualized right upper extremity with a partially visualized comminuted radial fracture. CT ABDOMEN AND PELVIS FINDINGS Liver: Not enlarged. No focal lesion. No laceration or subcapsular hematoma. Biliary System: Status post cholecystectomy. No biliary ductal dilatation. Pancreas: Normal pancreatic contour. No main pancreatic duct dilatation. Spleen: Not enlarged. No focal lesion. No laceration, subcapsular hematoma, or vascular injury. Adrenal Glands: No nodularity bilaterally. Kidneys: Bilateral kidneys enhance symmetrically. No hydronephrosis. No contusion, laceration, or subcapsular hematoma. No injury to the vascular structures or collecting systems. No hydroureter. The urinary bladder is unremarkable. On delayed imaging, there is no urothelial wall thickening and there are no filling defects in the opacified portions of the bilateral collecting  systems or ureters. Bowel: No small or large bowel wall thickening or dilatation. Question appendiceal stump with no findings of right lower quadrant inflammatory change Mesentery, Omentum, and Peritoneum: No simple free fluid ascites. No pneumoperitoneum. No hemoperitoneum. No mesenteric hematoma identified. No organized fluid collection. Pelvic Organs: Normal. Lymph Nodes: No abdominal, pelvic, inguinal lymphadenopathy. Vasculature: No abdominal aorta or iliac aneurysm. No active contrast extravasation or pseudoaneurysm. Musculoskeletal: No significant soft tissue hematoma. Tiny fat containing umbilical hernia.  No acute pelvic fracture. Please see separately dictated CT lumbar spine 07/09/2021. IMPRESSION: 1. No acute intracranial abnormality. 2. No acute displaced fracture or traumatic listhesis of the cervical spine. 3. Trace volume supero-anterior mediastinal hematoma posterior to the manubrium of unclear etiology. No associated aortic injury or findings to suggest aortic injury. No associated sternal/manubrial or costochondral junction fractures. 4. Otherwise no acute traumatic injury to the chest, abdomen, or pelvis. 5. Please see separately dictated CT thoracic and lumbar spine 07/09/2021. 6. Partially visualized distal comminuted right radial fracture. Other imaging findings of potential clinical significance: 1. 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 2. Partial empty sella. Findings is often a normal anatomic variant but can be associated with idiopathic intracranial hypertension (pseudotumor cerebri). 3. 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 4. A 0.8 cm left lower lobe pulmonary nodule. Non-contrast chest CT at 6-12 months is recommended. If the nodule is stable at time of repeat CT, then future CT at 18-24 months (from today's scan) is considered optional for low-risk patients, but is recommended for high-risk patients. This  recommendation follows the consensus statement: Guidelines for Management of Incidental Pulmonary Nodules Detected on CT Images: From the Fleischner Society 2017; Radiology 2017; 284:228-243. Electronically Signed   By: Tish Frederickson M.D.   On: 07/09/2021 20:40   CT CHEST ABDOMEN PELVIS W CONTRAST  Result Date: 07/09/2021 CLINICAL DATA:  Trauma, low back pain. EXAM: CT HEAD WITHOUT CONTRAST CT CERVICAL SPINE WITHOUT CONTRAST CT CHEST, ABDOMEN AND PELVIS WITH CONTRAST TECHNIQUE: Contiguous axial images were obtained from the base of the skull through the vertex without intravenous contrast. Multidetector CT imaging of the cervical spine was performed without intravenous contrast. Multiplanar CT image reconstructions were also generated. Multidetector CT imaging of the chest, abdomen and pelvis was performed following the standard protocol during bolus administration of intravenous contrast. CONTRAST:  OMNIPAQUE IOHEXOL 350 MG/ML SOLN COMPARISON:  None. FINDINGS: CT HEAD FINDINGS Brain: No evidence of large-territorial acute infarction. No parenchymal hemorrhage. 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 No extra-axial collection. No mass effect or midline shift. No hydrocephalus. Basilar cisterns are patent. Partial empty sella. Vascular: No hyperdense vessel. Skull: No acute fracture or focal lesion. Sinuses/Orbits: Paranasal sinuses and mastoid air cells are clear. The orbits are unremarkable. Other: None. CT CERVICAL FINDINGS Alignment: Normal. Skull base and vertebrae: Partial fusion of the C4-C5 levels. Multilevel degenerative changes of the spine. 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 No severe osseous central canal stenosis. No acute fracture. No aggressive appearing focal osseous lesion or focal pathologic process. Soft tissues and spinal canal: No prevertebral fluid or swelling. No visible  canal hematoma. Upper chest: Unremarkable. Other: None. CT CHEST FINDINGS Ports and Devices: None. Lungs/airways: Bilateral lower lobe subsegmental atelectasis. No focal consolidation. There is a difficult to measure slightly spiculated 1.1 x 0.5 cm right lower lobe subpleural nodule (5:87, 7:108). Associated mild tethering of the pleura is noted. Few scattered calcified pulmonary micronodules within the left lung. No pulmonary mass. No pulmonary contusion or laceration. No pneumatocele formation. The central airways are patent. Pleura: No pleural effusion. No pneumothorax. No hemothorax. Lymph Nodes: No mediastinal, hilar, or axillary lymphadenopathy. Mediastinum: No pneumomediastinum. Trace high density free fluid within the superior anterior mediastinum (3:21) just posterior to the manubrium. Normal fat plane between this fluid and the aorta is noted. No  aortic injury. The thoracic aorta is normal in caliber. The heart is normal in size. No significant pericardial effusion. The esophagus is unremarkable. The thyroid is unremarkable. Chest Wall / Breasts: No chest wall mass. Musculoskeletal: No acute rib or sternal fracture. Please see separately dictated CT thoracic spine 07/09/2021. partially visualized right upper extremity with a partially visualized comminuted radial fracture. CT ABDOMEN AND PELVIS FINDINGS Liver: Not enlarged. No focal lesion. No laceration or subcapsular hematoma. Biliary System: Status post cholecystectomy. No biliary ductal dilatation. Pancreas: Normal pancreatic contour. No main pancreatic duct dilatation. Spleen: Not enlarged. No focal lesion. No laceration, subcapsular hematoma, or vascular injury. Adrenal Glands: No nodularity bilaterally. Kidneys: Bilateral kidneys enhance symmetrically. No hydronephrosis. No contusion, laceration, or subcapsular hematoma. No injury to the vascular structures or collecting systems. No hydroureter. The urinary bladder is unremarkable. On delayed  imaging, there is no urothelial wall thickening and there are no filling defects in the opacified portions of the bilateral collecting systems or ureters. Bowel: No small or large bowel wall thickening or dilatation. Question appendiceal stump with no findings of right lower quadrant inflammatory change Mesentery, Omentum, and Peritoneum: No simple free fluid ascites. No pneumoperitoneum. No hemoperitoneum. No mesenteric hematoma identified. No organized fluid collection. Pelvic Organs: Normal. Lymph Nodes: No abdominal, pelvic, inguinal lymphadenopathy. Vasculature: No abdominal aorta or iliac aneurysm. No active contrast extravasation or pseudoaneurysm. Musculoskeletal: No significant soft tissue hematoma. Tiny fat containing umbilical hernia. No acute pelvic fracture. Please see separately dictated CT lumbar spine 07/09/2021. IMPRESSION: 1. No acute intracranial abnormality. 2. No acute displaced fracture or traumatic listhesis of the cervical spine. 3. Trace volume supero-anterior mediastinal hematoma posterior to the manubrium of unclear etiology. No associated aortic injury or findings to suggest aortic injury. No associated sternal/manubrial or costochondral junction fractures. 4. Otherwise no acute traumatic injury to the chest, abdomen, or pelvis. 5. Please see separately dictated CT thoracic and lumbar spine 07/09/2021. 6. Partially visualized distal comminuted right radial fracture. Other imaging findings of potential clinical significance: 1. 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 2. Partial empty sella. Findings is often a normal anatomic variant but can be associated with idiopathic intracranial hypertension (pseudotumor cerebri). 3. 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 4. A 0.8 cm left lower lobe pulmonary nodule. Non-contrast chest CT at 6-12 months is recommended. If the nodule is stable at time of repeat CT, then future CT  at 18-24 months (from today's scan) is considered optional for low-risk patients, but is recommended for high-risk patients. This recommendation follows the consensus statement: Guidelines for Management of Incidental Pulmonary Nodules Detected on CT Images: From the Fleischner Society 2017; Radiology 2017; 284:228-243. Electronically Signed   By: Tish Frederickson M.D.   On: 07/09/2021 20:40   CT T-SPINE NO CHARGE  Addendum Date: 07/09/2021   ADDENDUM REPORT: 07/09/2021 21:12 ADDENDUM: CT thoracic spine impression #2 discussed with Dr. Silverio Lay by telephone at 8:55 p.m. on 07/09/2021. Electronically Signed   By: Jackey Loge D.O.   On: 07/09/2021 21:12   Result Date: 07/09/2021 CLINICAL DATA:  Trauma. EXAM: CT THORACIC AND LUMBAR SPINE WITHOUT CONTRAST TECHNIQUE: Multidetector CT imaging of the thoracic and lumbar spine was performed without contrast. Multiplanar CT image reconstructions were also generated. COMPARISON:  None. FINDINGS: CT THORACIC SPINE FINDINGS In correlating with the concurrently performed CT of the cervical spine, there appear to be bilateral cervical ribs at the C7 level. Alignment: Cervical dextrocurvature. No  significant spondylolisthesis. Vertebrae: There is mild anterior wedging of the T11 vertebral body. Additionally, there is a mild T12 superior endplate compression deformity (less than 20% height loss). There is a transversely oriented fracture traversing the T11 spinous process and bilateral T11 inferior articular processes. This is suggestive of a flexion/distraction injury mechanism, and this fracture may be unstable. A subtle nondisplaced fracture is also questioned through the right T12 superior articular process (series 504, image 38). Multilevel degenerative endplate irregularity with Schmorl nodes. Paraspinal and other soft tissues: Please refer to the concurrently performed CT chest/abdomen/pelvis for a description of intrathoracic and abdominopelvic soft tissue findings. Disc  levels: Moderate disc space narrowing throughout the thoracic spine. No appreciable significant spinal canal stenosis. No high-grade bony neural foraminal narrowing. CT LUMBAR SPINE FINDINGS Segmentation: 5 lumbar vertebrae. The caudal most well-formed intervertebral disc space is designated L5-S1. Alignment: No significant spondylolisthesis. Vertebrae: Vertebral body height is maintained. No evidence of acute fracture to the lumbar spine. Multilevel degenerative endplate irregularity. Paraspinal and other soft tissues: The concurrently performed CT chest/abdomen/pelvis for description of abdominopelvic soft tissue findings. Disc levels: No more than mild disc space narrowing at any level. Lumbar spondylosis with level by level findings most notably as follows. L4-L5: Disc bulge with mild endplate spurring. Facet arthrosis and ligamentum flavum hypertrophy. Suspected moderate central canal stenosis with bilateral subarticular narrowing. Moderate right neural foraminal narrowing. IMPRESSION: CT THORACIC SPINE IMPRESSION 1. Please note cervical ribs are present bilaterally at the C7 level. 2. Acute transversely oriented fracture traversing the T11 spinous process and bilateral T11 inferior articular processes. Additionally, there is a mild T12 superior endplate compression deformity, likely acute. Mild anterior wedging of the T11 vertebra, also possibly acute. The constellation of findings is suggestive of a flexion/distraction injury, and this fracture may be unstable. A thoracic spine MRI is recommended to assess for any associated ligamentous injury, and to exclude spinal cord injury. A subtle nondisplaced fracture through the right T12 superior articular process is also questioned. 3. Thoracic spondylosis, as outlined. No appreciable significant spinal canal stenosis. No high-grade bony neural foraminal narrowing. 4. Thoracic dextrocurvature. 5. Please refer to the concurrently performed CT chest/abdomen/pelvis  for a description of intrathoracic and abdominopelvic soft tissue findings. CT LUMBAR SPINE IMPRESSION 1. No evidence of acute fracture to the lumbar spine. 2. Lumbar spondylosis, as described and greatest at L4-L5. At this level, there is suspected moderate central canal stenosis with bilateral subarticular narrowing, as well as moderate right neural foraminal narrowing. 3. Please refer to the concurrently performed CT chest/abdomen/pelvis for description of abdominopelvic soft tissue findings. Electronically Signed: By: Jackey Loge D.O. On: 07/09/2021 20:25   CT L-SPINE NO CHARGE  Addendum Date: 07/09/2021   ADDENDUM REPORT: 07/09/2021 21:12 ADDENDUM: CT thoracic spine impression #2 discussed with Dr. Silverio Lay by telephone at 8:55 p.m. on 07/09/2021. Electronically Signed   By: Jackey Loge D.O.   On: 07/09/2021 21:12   Result Date: 07/09/2021 CLINICAL DATA:  Trauma. EXAM: CT THORACIC AND LUMBAR SPINE WITHOUT CONTRAST TECHNIQUE: Multidetector CT imaging of the thoracic and lumbar spine was performed without contrast. Multiplanar CT image reconstructions were also generated. COMPARISON:  None. FINDINGS: CT THORACIC SPINE FINDINGS In correlating with the concurrently performed CT of the cervical spine, there appear to be bilateral cervical ribs at the C7 level. Alignment: Cervical dextrocurvature. No significant spondylolisthesis. Vertebrae: There is mild anterior wedging of the T11 vertebral body. Additionally, there is a mild T12 superior endplate compression deformity (less than 20% height loss).  There is a transversely oriented fracture traversing the T11 spinous process and bilateral T11 inferior articular processes. This is suggestive of a flexion/distraction injury mechanism, and this fracture may be unstable. A subtle nondisplaced fracture is also questioned through the right T12 superior articular process (series 504, image 38). Multilevel degenerative endplate irregularity with Schmorl nodes. Paraspinal  and other soft tissues: Please refer to the concurrently performed CT chest/abdomen/pelvis for a description of intrathoracic and abdominopelvic soft tissue findings. Disc levels: Moderate disc space narrowing throughout the thoracic spine. No appreciable significant spinal canal stenosis. No high-grade bony neural foraminal narrowing. CT LUMBAR SPINE FINDINGS Segmentation: 5 lumbar vertebrae. The caudal most well-formed intervertebral disc space is designated L5-S1. Alignment: No significant spondylolisthesis. Vertebrae: Vertebral body height is maintained. No evidence of acute fracture to the lumbar spine. Multilevel degenerative endplate irregularity. Paraspinal and other soft tissues: The concurrently performed CT chest/abdomen/pelvis for description of abdominopelvic soft tissue findings. Disc levels: No more than mild disc space narrowing at any level. Lumbar spondylosis with level by level findings most notably as follows. L4-L5: Disc bulge with mild endplate spurring. Facet arthrosis and ligamentum flavum hypertrophy. Suspected moderate central canal stenosis with bilateral subarticular narrowing. Moderate right neural foraminal narrowing. IMPRESSION: CT THORACIC SPINE IMPRESSION 1. Please note cervical ribs are present bilaterally at the C7 level. 2. Acute transversely oriented fracture traversing the T11 spinous process and bilateral T11 inferior articular processes. Additionally, there is a mild T12 superior endplate compression deformity, likely acute. Mild anterior wedging of the T11 vertebra, also possibly acute. The constellation of findings is suggestive of a flexion/distraction injury, and this fracture may be unstable. A thoracic spine MRI is recommended to assess for any associated ligamentous injury, and to exclude spinal cord injury. A subtle nondisplaced fracture through the right T12 superior articular process is also questioned. 3. Thoracic spondylosis, as outlined. No appreciable significant  spinal canal stenosis. No high-grade bony neural foraminal narrowing. 4. Thoracic dextrocurvature. 5. Please refer to the concurrently performed CT chest/abdomen/pelvis for a description of intrathoracic and abdominopelvic soft tissue findings. CT LUMBAR SPINE IMPRESSION 1. No evidence of acute fracture to the lumbar spine. 2. Lumbar spondylosis, as described and greatest at L4-L5. At this level, there is suspected moderate central canal stenosis with bilateral subarticular narrowing, as well as moderate right neural foraminal narrowing. 3. Please refer to the concurrently performed CT chest/abdomen/pelvis for description of abdominopelvic soft tissue findings. Electronically Signed: By: Jackey Loge D.O. On: 07/09/2021 20:25   DG Hand Complete Right  Result Date: 07/09/2021 CLINICAL DATA:  Fall, deformity EXAM: RIGHT WRIST - COMPLETE 3+ VIEW; RIGHT FOREARM - 2 VIEW; RIGHT HAND - COMPLETE 3+ VIEW COMPARISON:  None. FINDINGS: Forearm: The comminuted and displaced distal radial fracture is assessed below. No acute fracture of the proximal radius or ulna is identified. There is lucency through an olecranon enthesophyte which may reflect a remote fracture. There is no significant overlying soft tissue swelling to suggest acute fracture. Elbow alignment appears maintained. Wrist: There is a severely comminuted impacted fracture of the distal radius with intra-articular extension. There is mild dorsal displacement and angulation of the dominant distal fragment. There is a mildly displaced ulnar styloid fracture. Radiocarpal alignment is grossly intact. There is significant surrounding soft tissue swelling. Hand: No additional fracture is seen. Alignment of the bones of the hand is within normal limits. The joint spaces are preserved. There is a punctate radiopaque foreign body within the soft tissues of the index finger overlying the radial  aspect of the index finger proximal phalanx. IMPRESSION: 1. Severely  comminuted and mildly displaced impacted fracture of the distal radius with intra-articular extension. 2. Mildly displaced ulnar styloid fracture. 3. Punctate radiopaque foreign body within the soft tissues along the radial aspect of the index finger proximal phalanx. 4. Probable remote fracture through an olecranon enthesophyte. Electronically Signed   By: Lesia Hausen M.D.   On: 07/09/2021 15:42    Anti-infectives: Anti-infectives (From admission, onward)    None       Assessment/Plan: Fall from ladder  Mediastinal hematoma Right distal radius fracture/ mildly displaced ulnar styloid fracture T12 compression fracture/ T11 anterior wedging, bilateral inferior articular process, and spinous process fx Seizure d/o  Plan: Increase IV fluids Dr. Franky Macho - Neurosurgery to evaluate for possible brace, mobilization instructions Log roll until NSU eval Right wrist - Dr. Arita Miss tentatively plans surgery on Monday afternoon Adjust pain meds - PRN morphine Clear liquids for now.  LOS: 1 day    Wynona Luna 07/10/2021

## 2021-07-11 NOTE — Progress Notes (Signed)
Patient ID: Craig Church, male   DOB: 08/10/1966, 55 y.o.   MRN: [id number] BP 106/66 (BP Location: Left Arm)   Pulse (!) 59   Temp 97.8 F (36.6 C) (Oral)   Resp 16   Ht 5\' 11"  (1.803 m)   Wt 99.8 kg   SpO2 97%   BMI 30.68 kg/m  Alert and oriented x 4 speech is clear and fluent Moving lower extremities normallay Awaiting brace  OR tomorrow for right hand

## 2021-07-11 NOTE — Progress Notes (Signed)
Pt has home CPAP at bedside and will place when ready.

## 2021-07-11 NOTE — Progress Notes (Signed)
Notified ortho tech about vendor brace needed for patient. Ortho tech notified this RN that she will bring it up shortly.

## 2021-07-11 NOTE — Plan of Care (Signed)
Problem: Education: Goal: Knowledge of General Education information will improve Description: Including pain rating scale, medication(s)/side effects and non-pharmacologic comfort measures Outcome: Progressing   Problem: Health Behavior/Discharge Planning: Goal: Ability to manage health-related needs will improve Outcome: Progressing   Problem: Clinical Measurements: Goal: Ability to maintain clinical measurements within normal limits will improve Outcome: Progressing Goal: Will remain free from infection Outcome: Progressing Goal: Diagnostic test results will improve Outcome: Progressing Goal: Respiratory complications will improve Outcome: Progressing Goal: Cardiovascular complication will be avoided Outcome: Progressing   Problem: Activity: Goal: Risk for activity intolerance will decrease Outcome: Progressing   Problem: Nutrition: Goal: Adequate nutrition will be maintained Outcome: Progressing   Problem: Coping: Goal: Level of anxiety will decrease Outcome: Progressing   Problem: Elimination: Goal: Will not experience complications related to bowel motility Outcome: Progressing Goal: Will not experience complications related to urinary retention Outcome: Progressing   Problem: Pain Managment: Goal: General experience of comfort will improve Outcome: Not Progressing   Problem: Safety: Goal: Ability to remain free from injury will improve Outcome: Progressing   Problem: Skin Integrity: Goal: Risk for impaired skin integrity will decrease Outcome: Progressing  Report received and care assumed from previous shift. VS obtained, shift assessments completed - see flowsheets. PRN pain medications given as needed for c/o pain to back and R UE - see MAR; patient denies need for further intervention. R UE elevated, ACE/splint remains intact. Bedrest maintained, log rolled as needed. CPAP in use at HS. Patient currently resting in bed, bed in lowest position. Denies  needs. Call bell within reach. Bedalarm in use at all times.

## 2021-07-11 NOTE — Progress Notes (Signed)
Orthopedic Tech Progress Note Patient Details:  Craig Church 30-Jan-1966 [id number]   [facility] lumbar brace called into [facility] at 1340.  Patient ID: [name], male   DOB: [birth year], 55 y.o.   MRN: [id number]  Docia Furl 07/11/2021, 1:53 PM

## 2021-07-11 NOTE — Progress Notes (Signed)
Subjective/Chief Complaint: Patient tolerating regular diet Scheduled for wrist surgery tomorrow  Neurosurgery - left recommendations for lumbar brace but not ordered yet   Objective: Vital signs in last 24 hours: Temp:  [97.8 F (36.6 C)-97.9 F (36.6 C)] 97.8 F (36.6 C) (09/25 0830) Pulse Rate:  [51-61] 59 (09/25 0830) Resp:  [16-18] 16 (09/25 0830) BP: (106-123)/(66-75) 106/66 (09/25 0830) SpO2:  [97 %-100 %] 97 % (09/25 0830) Last BM Date: 07/09/21  Intake/Output from previous day: 09/24 0701 - 09/25 0700 In: 580 [P.O.:580] Out: 375 [Urine:375] Intake/Output this shift: No intake/output data recorded.  Right arm in splint - fingers moving with sensation Moving lower extremities  Lab Results:  Recent Labs    07/10/21 0418 07/10/21 0544  WBC 8.0 8.2  HGB 11.2* 11.7*  HCT 35.7* 35.8*  PLT 261 277   BMET Recent Labs    07/09/21 1630 07/10/21 0418 07/10/21 0544  NA 138 137  --   K 3.3* 3.8  --   CL 109 109  --   CO2 21* 21*  --   GLUCOSE 98 105*  --   BUN 20 15  --   CREATININE 1.07 0.88 0.85  CALCIUM 9.1 8.6*  --    PT/INR Recent Labs    07/09/21 1630  LABPROT 12.4  INR 0.9   ABG No results for input(s): PHART, HCO3 in the last 72 hours.  Invalid input(s): PCO2, PO2  Studies/Results: DG Forearm Right  Result Date: 07/09/2021 CLINICAL DATA:  Fall, deformity EXAM: RIGHT WRIST - COMPLETE 3+ VIEW; RIGHT FOREARM - 2 VIEW; RIGHT HAND - COMPLETE 3+ VIEW COMPARISON:  None. FINDINGS: Forearm: The comminuted and displaced distal radial fracture is assessed below. No acute fracture of the proximal radius or ulna is identified. There is lucency through an olecranon enthesophyte which may reflect a remote fracture. There is no significant overlying soft tissue swelling to suggest acute fracture. Elbow alignment appears maintained. Wrist: There is a severely comminuted impacted fracture of the distal radius with intra-articular extension. There is mild  dorsal displacement and angulation of the dominant distal fragment. There is a mildly displaced ulnar styloid fracture. Radiocarpal alignment is grossly intact. There is significant surrounding soft tissue swelling. Hand: No additional fracture is seen. Alignment of the bones of the hand is within normal limits. The joint spaces are preserved. There is a punctate radiopaque foreign body within the soft tissues of the index finger overlying the radial aspect of the index finger proximal phalanx. IMPRESSION: 1. Severely comminuted and mildly displaced impacted fracture of the distal radius with intra-articular extension. 2. Mildly displaced ulnar styloid fracture. 3. Punctate radiopaque foreign body within the soft tissues along the radial aspect of the index finger proximal phalanx. 4. Probable remote fracture through an olecranon enthesophyte. Electronically Signed   By: Lesia Hausen M.D.   On: 07/09/2021 15:42   DG Wrist Complete Right  Result Date: 07/09/2021 CLINICAL DATA:  Right wrist reduction. EXAM: RIGHT WRIST - COMPLETE 3+ VIEW COMPARISON:  Pre reduction radiograph earlier today FINDINGS: Frontal and lateral views of the right wrist obtained with overlying splint material in place. Improved alignment of comminuted and displaced distal radius fracture with mild residual radial displacement. Unchanged alignment of ulnar styloid fracture. IMPRESSION: Improved alignment of comminuted distal radius fracture postreduction with mild residual radial displacement. Unchanged ulnar styloid fracture. Electronically Signed   By: Narda Rutherford M.D.   On: 07/09/2021 20:15   DG Wrist Complete Right  Result Date: 07/09/2021  CLINICAL DATA:  Fall, deformity EXAM: RIGHT WRIST - COMPLETE 3+ VIEW; RIGHT FOREARM - 2 VIEW; RIGHT HAND - COMPLETE 3+ VIEW COMPARISON:  None. FINDINGS: Forearm: The comminuted and displaced distal radial fracture is assessed below. No acute fracture of the proximal radius or ulna is  identified. There is lucency through an olecranon enthesophyte which may reflect a remote fracture. There is no significant overlying soft tissue swelling to suggest acute fracture. Elbow alignment appears maintained. Wrist: There is a severely comminuted impacted fracture of the distal radius with intra-articular extension. There is mild dorsal displacement and angulation of the dominant distal fragment. There is a mildly displaced ulnar styloid fracture. Radiocarpal alignment is grossly intact. There is significant surrounding soft tissue swelling. Hand: No additional fracture is seen. Alignment of the bones of the hand is within normal limits. The joint spaces are preserved. There is a punctate radiopaque foreign body within the soft tissues of the index finger overlying the radial aspect of the index finger proximal phalanx. IMPRESSION: 1. Severely comminuted and mildly displaced impacted fracture of the distal radius with intra-articular extension. 2. Mildly displaced ulnar styloid fracture. 3. Punctate radiopaque foreign body within the soft tissues along the radial aspect of the index finger proximal phalanx. 4. Probable remote fracture through an olecranon enthesophyte. Electronically Signed   By: Lesia Hausen M.D.   On: 07/09/2021 15:42   CT Head Wo Contrast  Result Date: 07/09/2021 CLINICAL DATA:  Trauma, low back pain. EXAM: CT HEAD WITHOUT CONTRAST CT CERVICAL SPINE WITHOUT CONTRAST CT CHEST, ABDOMEN AND PELVIS WITH CONTRAST TECHNIQUE: Contiguous axial images were obtained from the base of the skull through the vertex without intravenous contrast. Multidetector CT imaging of the cervical spine was performed without intravenous contrast. Multiplanar CT image reconstructions were also generated. Multidetector CT imaging of the chest, abdomen and pelvis was performed following the standard protocol during bolus administration of intravenous contrast. CONTRAST:  OMNIPAQUE IOHEXOL 350 MG/ML SOLN  COMPARISON:  None. FINDINGS: CT HEAD FINDINGS Brain: No evidence of large-territorial acute infarction. No parenchymal hemorrhage. 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 No extra-axial collection. No mass effect or midline shift. No hydrocephalus. Basilar cisterns are patent. Partial empty sella. Vascular: No hyperdense vessel. Skull: No acute fracture or focal lesion. Sinuses/Orbits: Paranasal sinuses and mastoid air cells are clear. The orbits are unremarkable. Other: None. CT CERVICAL FINDINGS Alignment: Normal. Skull base and vertebrae: Partial fusion of the C4-C5 levels. Multilevel degenerative changes of the spine. 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 No severe osseous central canal stenosis. No acute fracture. No aggressive appearing focal osseous lesion or focal pathologic process. Soft tissues and spinal canal: No prevertebral fluid or swelling. No visible canal hematoma. Upper chest: Unremarkable. Other: None. CT CHEST FINDINGS Ports and Devices: None. Lungs/airways: Bilateral lower lobe subsegmental atelectasis. No focal consolidation. There is a difficult to measure slightly spiculated 1.1 x 0.5 cm right lower lobe subpleural nodule (5:87, 7:108). Associated mild tethering of the pleura is noted. Few scattered calcified pulmonary micronodules within the left lung. No pulmonary mass. No pulmonary contusion or laceration. No pneumatocele formation. The central airways are patent. Pleura: No pleural effusion. No pneumothorax. No hemothorax. Lymph Nodes: No mediastinal, hilar, or axillary lymphadenopathy. Mediastinum: No pneumomediastinum. Trace high density free fluid within the superior anterior mediastinum (3:21) just posterior to the manubrium. Normal fat plane between this fluid and the aorta is noted. No  aortic injury. The thoracic aorta is normal in caliber. The heart is normal in size. No  significant pericardial effusion. The esophagus is unremarkable. The thyroid is unremarkable. Chest Wall / Breasts: No chest wall mass. Musculoskeletal: No acute rib or sternal fracture. Please see separately dictated CT thoracic spine 07/09/2021. partially visualized right upper extremity with a partially visualized comminuted radial fracture. CT ABDOMEN AND PELVIS FINDINGS Liver: Not enlarged. No focal lesion. No laceration or subcapsular hematoma. Biliary System: Status post cholecystectomy. No biliary ductal dilatation. Pancreas: Normal pancreatic contour. No main pancreatic duct dilatation. Spleen: Not enlarged. No focal lesion. No laceration, subcapsular hematoma, or vascular injury. Adrenal Glands: No nodularity bilaterally. Kidneys: Bilateral kidneys enhance symmetrically. No hydronephrosis. No contusion, laceration, or subcapsular hematoma. No injury to the vascular structures or collecting systems. No hydroureter. The urinary bladder is unremarkable. On delayed imaging, there is no urothelial wall thickening and there are no filling defects in the opacified portions of the bilateral collecting systems or ureters. Bowel: No small or large bowel wall thickening or dilatation. Question appendiceal stump with no findings of right lower quadrant inflammatory change Mesentery, Omentum, and Peritoneum: No simple free fluid ascites. No pneumoperitoneum. No hemoperitoneum. No mesenteric hematoma identified. No organized fluid collection. Pelvic Organs: Normal. Lymph Nodes: No abdominal, pelvic, inguinal lymphadenopathy. Vasculature: No abdominal aorta or iliac aneurysm. No active contrast extravasation or pseudoaneurysm. Musculoskeletal: No significant soft tissue hematoma. Tiny fat containing umbilical hernia. No acute pelvic fracture. Please see separately dictated CT lumbar spine 07/09/2021. IMPRESSION: 1. No acute intracranial abnormality. 2. No acute displaced fracture or traumatic listhesis of the cervical  spine. 3. Trace volume supero-anterior mediastinal hematoma posterior to the manubrium of unclear etiology. No associated aortic injury or findings to suggest aortic injury. No associated sternal/manubrial or costochondral junction fractures. 4. Otherwise no acute traumatic injury to the chest, abdomen, or pelvis. 5. Please see separately dictated CT thoracic and lumbar spine 07/09/2021. 6. Partially visualized distal comminuted right radial fracture. Other imaging findings of potential clinical significance: 1. 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 2. Partial empty sella. Findings is often a normal anatomic variant but can be associated with idiopathic intracranial hypertension (pseudotumor cerebri). 3. 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 4. A 0.8 cm left lower lobe pulmonary nodule. Non-contrast chest CT at 6-12 months is recommended. If the nodule is stable at time of repeat CT, then future CT at 18-24 months (from today's scan) is considered optional for low-risk patients, but is recommended for high-risk patients. This recommendation follows the consensus statement: Guidelines for Management of Incidental Pulmonary Nodules Detected on CT Images: From the Fleischner Society 2017; Radiology 2017; 284:228-243. Electronically Signed   By: Tish Frederickson M.D.   On: 07/09/2021 20:40   CT Cervical Spine Wo Contrast  Result Date: 07/09/2021 CLINICAL DATA:  Trauma, low back pain. EXAM: CT HEAD WITHOUT CONTRAST CT CERVICAL SPINE WITHOUT CONTRAST CT CHEST, ABDOMEN AND PELVIS WITH CONTRAST TECHNIQUE: Contiguous axial images were obtained from the base of the skull through the vertex without intravenous contrast. Multidetector CT imaging of the cervical spine was performed without intravenous contrast. Multiplanar CT image reconstructions were also generated. Multidetector CT imaging of the chest, abdomen and pelvis was performed following  the standard protocol during bolus administration of intravenous contrast. CONTRAST:  OMNIPAQUE IOHEXOL 350 MG/ML SOLN COMPARISON:  None. FINDINGS: CT HEAD FINDINGS Brain: No evidence of large-territorial acute infarction. No parenchymal hemorrhage.  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 No extra-axial collection. No mass effect or midline shift. No hydrocephalus. Basilar cisterns are patent. Partial empty sella. Vascular: No hyperdense vessel. Skull: No acute fracture or focal lesion. Sinuses/Orbits: Paranasal sinuses and mastoid air cells are clear. The orbits are unremarkable. Other: None. CT CERVICAL FINDINGS Alignment: Normal. Skull base and vertebrae: Partial fusion of the C4-C5 levels. Multilevel degenerative changes of the spine. 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 No severe osseous central canal stenosis. No acute fracture. No aggressive appearing focal osseous lesion or focal pathologic process. Soft tissues and spinal canal: No prevertebral fluid or swelling. No visible canal hematoma. Upper chest: Unremarkable. Other: None. CT CHEST FINDINGS Ports and Devices: None. Lungs/airways: Bilateral lower lobe subsegmental atelectasis. No focal consolidation. There is a difficult to measure slightly spiculated 1.1 x 0.5 cm right lower lobe subpleural nodule (5:87, 7:108). Associated mild tethering of the pleura is noted. Few scattered calcified pulmonary micronodules within the left lung. No pulmonary mass. No pulmonary contusion or laceration. No pneumatocele formation. The central airways are patent. Pleura: No pleural effusion. No pneumothorax. No hemothorax. Lymph Nodes: No mediastinal, hilar, or axillary lymphadenopathy. Mediastinum: No pneumomediastinum. Trace high density free fluid within the superior anterior mediastinum (3:21) just posterior to the manubrium. Normal fat plane between  this fluid and the aorta is noted. No aortic injury. The thoracic aorta is normal in caliber. The heart is normal in size. No significant pericardial effusion. The esophagus is unremarkable. The thyroid is unremarkable. Chest Wall / Breasts: No chest wall mass. Musculoskeletal: No acute rib or sternal fracture. Please see separately dictated CT thoracic spine 07/09/2021. partially visualized right upper extremity with a partially visualized comminuted radial fracture. CT ABDOMEN AND PELVIS FINDINGS Liver: Not enlarged. No focal lesion. No laceration or subcapsular hematoma. Biliary System: Status post cholecystectomy. No biliary ductal dilatation. Pancreas: Normal pancreatic contour. No main pancreatic duct dilatation. Spleen: Not enlarged. No focal lesion. No laceration, subcapsular hematoma, or vascular injury. Adrenal Glands: No nodularity bilaterally. Kidneys: Bilateral kidneys enhance symmetrically. No hydronephrosis. No contusion, laceration, or subcapsular hematoma. No injury to the vascular structures or collecting systems. No hydroureter. The urinary bladder is unremarkable. On delayed imaging, there is no urothelial wall thickening and there are no filling defects in the opacified portions of the bilateral collecting systems or ureters. Bowel: No small or large bowel wall thickening or dilatation. Question appendiceal stump with no findings of right lower quadrant inflammatory change Mesentery, Omentum, and Peritoneum: No simple free fluid ascites. No pneumoperitoneum. No hemoperitoneum. No mesenteric hematoma identified. No organized fluid collection. Pelvic Organs: Normal. Lymph Nodes: No abdominal, pelvic, inguinal lymphadenopathy. Vasculature: No abdominal aorta or iliac aneurysm. No active contrast extravasation or pseudoaneurysm. Musculoskeletal: No significant soft tissue hematoma. Tiny fat containing umbilical hernia. No acute pelvic fracture. Please see separately dictated CT lumbar spine  07/09/2021. IMPRESSION: 1. No acute intracranial abnormality. 2. No acute displaced fracture or traumatic listhesis of the cervical spine. 3. Trace volume supero-anterior mediastinal hematoma posterior to the manubrium of unclear etiology. No associated aortic injury or findings to suggest aortic injury. No associated sternal/manubrial or costochondral junction fractures. 4. Otherwise no acute traumatic injury to the chest, abdomen, or pelvis. 5. Please see separately dictated CT thoracic and lumbar spine 07/09/2021. 6. Partially visualized distal comminuted right radial fracture. Other imaging findings of potential clinical significance: 1. A 0.6 cm likely colloid cyst within  the region of the foramen of Monro. 2. Partial empty sella. Findings is often a normal anatomic variant but can be associated with idiopathic intracranial hypertension (pseudotumor cerebri). 3. 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 4. A 0.8 cm left lower lobe pulmonary nodule. Non-contrast chest CT at 6-12 months is recommended. If the nodule is stable at time of repeat CT, then future CT at 18-24 months (from today's scan) is considered optional for low-risk patients, but is recommended for high-risk patients. This recommendation follows the consensus statement: Guidelines for Management of Incidental Pulmonary Nodules Detected on CT Images: From the Fleischner Society 2017; Radiology 2017; 284:228-243. Electronically Signed   By: Tish Frederickson M.D.   On: 07/09/2021 20:40   CT CHEST ABDOMEN PELVIS W CONTRAST  Result Date: 07/09/2021 CLINICAL DATA:  Trauma, low back pain. EXAM: CT HEAD WITHOUT CONTRAST CT CERVICAL SPINE WITHOUT CONTRAST CT CHEST, ABDOMEN AND PELVIS WITH CONTRAST TECHNIQUE: Contiguous axial images were obtained from the base of the skull through the vertex without intravenous contrast. Multidetector CT imaging of the cervical spine was performed without intravenous contrast.  Multiplanar CT image reconstructions were also generated. Multidetector CT imaging of the chest, abdomen and pelvis was performed following the standard protocol during bolus administration of intravenous contrast. CONTRAST:  OMNIPAQUE IOHEXOL 350 MG/ML SOLN COMPARISON:  None. FINDINGS: CT HEAD FINDINGS Brain: No evidence of large-territorial acute infarction. No parenchymal hemorrhage. 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 No extra-axial collection. No mass effect or midline shift. No hydrocephalus. Basilar cisterns are patent. Partial empty sella. Vascular: No hyperdense vessel. Skull: No acute fracture or focal lesion. Sinuses/Orbits: Paranasal sinuses and mastoid air cells are clear. The orbits are unremarkable. Other: None. CT CERVICAL FINDINGS Alignment: Normal. Skull base and vertebrae: Partial fusion of the C4-C5 levels. Multilevel degenerative changes of the spine. 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 No severe osseous central canal stenosis. No acute fracture. No aggressive appearing focal osseous lesion or focal pathologic process. Soft tissues and spinal canal: No prevertebral fluid or swelling. No visible canal hematoma. Upper chest: Unremarkable. Other: None. CT CHEST FINDINGS Ports and Devices: None. Lungs/airways: Bilateral lower lobe subsegmental atelectasis. No focal consolidation. There is a difficult to measure slightly spiculated 1.1 x 0.5 cm right lower lobe subpleural nodule (5:87, 7:108). Associated mild tethering of the pleura is noted. Few scattered calcified pulmonary micronodules within the left lung. No pulmonary mass. No pulmonary contusion or laceration. No pneumatocele formation. The central airways are patent. Pleura: No pleural effusion. No pneumothorax. No hemothorax. Lymph Nodes: No mediastinal, hilar, or axillary lymphadenopathy. Mediastinum: No  pneumomediastinum. Trace high density free fluid within the superior anterior mediastinum (3:21) just posterior to the manubrium. Normal fat plane between this fluid and the aorta is noted. No aortic injury. The thoracic aorta is normal in caliber. The heart is normal in size. No significant pericardial effusion. The esophagus is unremarkable. The thyroid is unremarkable. Chest Wall / Breasts: No chest wall mass. Musculoskeletal: No acute rib or sternal fracture. Please see separately dictated CT thoracic spine 07/09/2021. partially visualized right upper extremity with a partially visualized comminuted radial fracture. CT ABDOMEN AND PELVIS FINDINGS Liver: Not enlarged. No focal lesion. No laceration or subcapsular hematoma. Biliary System: Status post cholecystectomy. No biliary ductal dilatation. Pancreas: Normal pancreatic contour. No main pancreatic duct dilatation. Spleen: Not enlarged. No focal lesion. No laceration, subcapsular hematoma,  or vascular injury. Adrenal Glands: No nodularity bilaterally. Kidneys: Bilateral kidneys enhance symmetrically. No hydronephrosis. No contusion, laceration, or subcapsular hematoma. No injury to the vascular structures or collecting systems. No hydroureter. The urinary bladder is unremarkable. On delayed imaging, there is no urothelial wall thickening and there are no filling defects in the opacified portions of the bilateral collecting systems or ureters. Bowel: No small or large bowel wall thickening or dilatation. Question appendiceal stump with no findings of right lower quadrant inflammatory change Mesentery, Omentum, and Peritoneum: No simple free fluid ascites. No pneumoperitoneum. No hemoperitoneum. No mesenteric hematoma identified. No organized fluid collection. Pelvic Organs: Normal. Lymph Nodes: No abdominal, pelvic, inguinal lymphadenopathy. Vasculature: No abdominal aorta or iliac aneurysm. No active contrast extravasation or pseudoaneurysm. Musculoskeletal:  No significant soft tissue hematoma. Tiny fat containing umbilical hernia. No acute pelvic fracture. Please see separately dictated CT lumbar spine 07/09/2021. IMPRESSION: 1. No acute intracranial abnormality. 2. No acute displaced fracture or traumatic listhesis of the cervical spine. 3. Trace volume supero-anterior mediastinal hematoma posterior to the manubrium of unclear etiology. No associated aortic injury or findings to suggest aortic injury. No associated sternal/manubrial or costochondral junction fractures. 4. Otherwise no acute traumatic injury to the chest, abdomen, or pelvis. 5. Please see separately dictated CT thoracic and lumbar spine 07/09/2021. 6. Partially visualized distal comminuted right radial fracture. Other imaging findings of potential clinical significance: 1. 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 2. Partial empty sella. Findings is often a normal anatomic variant but can be associated with idiopathic intracranial hypertension (pseudotumor cerebri). 3. 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 4. A 0.8 cm left lower lobe pulmonary nodule. Non-contrast chest CT at 6-12 months is recommended. If the nodule is stable at time of repeat CT, then future CT at 18-24 months (from today's scan) is considered optional for low-risk patients, but is recommended for high-risk patients. This recommendation follows the consensus statement: Guidelines for Management of Incidental Pulmonary Nodules Detected on CT Images: From the Fleischner Society 2017; Radiology 2017; 284:228-243. Electronically Signed   By: Tish Frederickson M.D.   On: 07/09/2021 20:40   CT T-SPINE NO CHARGE  Addendum Date: 07/09/2021   ADDENDUM REPORT: 07/09/2021 21:12 ADDENDUM: CT thoracic spine impression #2 discussed with Dr. Silverio Lay by telephone at 8:55 p.m. on 07/09/2021. Electronically Signed   By: Jackey Loge D.O.   On: 07/09/2021 21:12   Result Date:  07/09/2021 CLINICAL DATA:  Trauma. EXAM: CT THORACIC AND LUMBAR SPINE WITHOUT CONTRAST TECHNIQUE: Multidetector CT imaging of the thoracic and lumbar spine was performed without contrast. Multiplanar CT image reconstructions were also generated. COMPARISON:  None. FINDINGS: CT THORACIC SPINE FINDINGS In correlating with the concurrently performed CT of the cervical spine, there appear to be bilateral cervical ribs at the C7 level. Alignment: Cervical dextrocurvature. No significant spondylolisthesis. Vertebrae: There is mild anterior wedging of the T11 vertebral body. Additionally, there is a mild T12 superior endplate compression deformity (less than 20% height loss). There is a transversely oriented fracture traversing the T11 spinous process and bilateral T11 inferior articular processes. This is suggestive of a flexion/distraction injury mechanism, and this fracture may be unstable. A subtle nondisplaced fracture is also questioned through the right T12 superior articular process (series 504, image 38). Multilevel degenerative endplate irregularity with Schmorl nodes. Paraspinal and other soft tissues: Please refer to the concurrently performed CT chest/abdomen/pelvis for a description of intrathoracic and abdominopelvic soft tissue  findings. Disc levels: Moderate disc space narrowing throughout the thoracic spine. No appreciable significant spinal canal stenosis. No high-grade bony neural foraminal narrowing. CT LUMBAR SPINE FINDINGS Segmentation: 5 lumbar vertebrae. The caudal most well-formed intervertebral disc space is designated L5-S1. Alignment: No significant spondylolisthesis. Vertebrae: Vertebral body height is maintained. No evidence of acute fracture to the lumbar spine. Multilevel degenerative endplate irregularity. Paraspinal and other soft tissues: The concurrently performed CT chest/abdomen/pelvis for description of abdominopelvic soft tissue findings. Disc levels: No more than mild disc space  narrowing at any level. Lumbar spondylosis with level by level findings most notably as follows. L4-L5: Disc bulge with mild endplate spurring. Facet arthrosis and ligamentum flavum hypertrophy. Suspected moderate central canal stenosis with bilateral subarticular narrowing. Moderate right neural foraminal narrowing. IMPRESSION: CT THORACIC SPINE IMPRESSION 1. Please note cervical ribs are present bilaterally at the C7 level. 2. Acute transversely oriented fracture traversing the T11 spinous process and bilateral T11 inferior articular processes. Additionally, there is a mild T12 superior endplate compression deformity, likely acute. Mild anterior wedging of the T11 vertebra, also possibly acute. The constellation of findings is suggestive of a flexion/distraction injury, and this fracture may be unstable. A thoracic spine MRI is recommended to assess for any associated ligamentous injury, and to exclude spinal cord injury. A subtle nondisplaced fracture through the right T12 superior articular process is also questioned. 3. Thoracic spondylosis, as outlined. No appreciable significant spinal canal stenosis. No high-grade bony neural foraminal narrowing. 4. Thoracic dextrocurvature. 5. Please refer to the concurrently performed CT chest/abdomen/pelvis for a description of intrathoracic and abdominopelvic soft tissue findings. CT LUMBAR SPINE IMPRESSION 1. No evidence of acute fracture to the lumbar spine. 2. Lumbar spondylosis, as described and greatest at L4-L5. At this level, there is suspected moderate central canal stenosis with bilateral subarticular narrowing, as well as moderate right neural foraminal narrowing. 3. Please refer to the concurrently performed CT chest/abdomen/pelvis for description of abdominopelvic soft tissue findings. Electronically Signed: By: Jackey Loge D.O. On: 07/09/2021 20:25   CT L-SPINE NO CHARGE  Addendum Date: 07/09/2021   ADDENDUM REPORT: 07/09/2021 21:12 ADDENDUM: CT  thoracic spine impression #2 discussed with Dr. Silverio Lay by telephone at 8:55 p.m. on 07/09/2021. Electronically Signed   By: Jackey Loge D.O.   On: 07/09/2021 21:12   Result Date: 07/09/2021 CLINICAL DATA:  Trauma. EXAM: CT THORACIC AND LUMBAR SPINE WITHOUT CONTRAST TECHNIQUE: Multidetector CT imaging of the thoracic and lumbar spine was performed without contrast. Multiplanar CT image reconstructions were also generated. COMPARISON:  None. FINDINGS: CT THORACIC SPINE FINDINGS In correlating with the concurrently performed CT of the cervical spine, there appear to be bilateral cervical ribs at the C7 level. Alignment: Cervical dextrocurvature. No significant spondylolisthesis. Vertebrae: There is mild anterior wedging of the T11 vertebral body. Additionally, there is a mild T12 superior endplate compression deformity (less than 20% height loss). There is a transversely oriented fracture traversing the T11 spinous process and bilateral T11 inferior articular processes. This is suggestive of a flexion/distraction injury mechanism, and this fracture may be unstable. A subtle nondisplaced fracture is also questioned through the right T12 superior articular process (series 504, image 38). Multilevel degenerative endplate irregularity with Schmorl nodes. Paraspinal and other soft tissues: Please refer to the concurrently performed CT chest/abdomen/pelvis for a description of intrathoracic and abdominopelvic soft tissue findings. Disc levels: Moderate disc space narrowing throughout the thoracic spine. No appreciable significant spinal canal stenosis. No high-grade bony neural foraminal narrowing. CT LUMBAR SPINE FINDINGS Segmentation:  5 lumbar vertebrae. The caudal most well-formed intervertebral disc space is designated L5-S1. Alignment: No significant spondylolisthesis. Vertebrae: Vertebral body height is maintained. No evidence of acute fracture to the lumbar spine. Multilevel degenerative endplate irregularity.  Paraspinal and other soft tissues: The concurrently performed CT chest/abdomen/pelvis for description of abdominopelvic soft tissue findings. Disc levels: No more than mild disc space narrowing at any level. Lumbar spondylosis with level by level findings most notably as follows. L4-L5: Disc bulge with mild endplate spurring. Facet arthrosis and ligamentum flavum hypertrophy. Suspected moderate central canal stenosis with bilateral subarticular narrowing. Moderate right neural foraminal narrowing. IMPRESSION: CT THORACIC SPINE IMPRESSION 1. Please note cervical ribs are present bilaterally at the C7 level. 2. Acute transversely oriented fracture traversing the T11 spinous process and bilateral T11 inferior articular processes. Additionally, there is a mild T12 superior endplate compression deformity, likely acute. Mild anterior wedging of the T11 vertebra, also possibly acute. The constellation of findings is suggestive of a flexion/distraction injury, and this fracture may be unstable. A thoracic spine MRI is recommended to assess for any associated ligamentous injury, and to exclude spinal cord injury. A subtle nondisplaced fracture through the right T12 superior articular process is also questioned. 3. Thoracic spondylosis, as outlined. No appreciable significant spinal canal stenosis. No high-grade bony neural foraminal narrowing. 4. Thoracic dextrocurvature. 5. Please refer to the concurrently performed CT chest/abdomen/pelvis for a description of intrathoracic and abdominopelvic soft tissue findings. CT LUMBAR SPINE IMPRESSION 1. No evidence of acute fracture to the lumbar spine. 2. Lumbar spondylosis, as described and greatest at L4-L5. At this level, there is suspected moderate central canal stenosis with bilateral subarticular narrowing, as well as moderate right neural foraminal narrowing. 3. Please refer to the concurrently performed CT chest/abdomen/pelvis for description of abdominopelvic soft tissue  findings. Electronically Signed: By: Jackey Loge D.O. On: 07/09/2021 20:25   DG Hand Complete Right  Result Date: 07/09/2021 CLINICAL DATA:  Fall, deformity EXAM: RIGHT WRIST - COMPLETE 3+ VIEW; RIGHT FOREARM - 2 VIEW; RIGHT HAND - COMPLETE 3+ VIEW COMPARISON:  None. FINDINGS: Forearm: The comminuted and displaced distal radial fracture is assessed below. No acute fracture of the proximal radius or ulna is identified. There is lucency through an olecranon enthesophyte which may reflect a remote fracture. There is no significant overlying soft tissue swelling to suggest acute fracture. Elbow alignment appears maintained. Wrist: There is a severely comminuted impacted fracture of the distal radius with intra-articular extension. There is mild dorsal displacement and angulation of the dominant distal fragment. There is a mildly displaced ulnar styloid fracture. Radiocarpal alignment is grossly intact. There is significant surrounding soft tissue swelling. Hand: No additional fracture is seen. Alignment of the bones of the hand is within normal limits. The joint spaces are preserved. There is a punctate radiopaque foreign body within the soft tissues of the index finger overlying the radial aspect of the index finger proximal phalanx. IMPRESSION: 1. Severely comminuted and mildly displaced impacted fracture of the distal radius with intra-articular extension. 2. Mildly displaced ulnar styloid fracture. 3. Punctate radiopaque foreign body within the soft tissues along the radial aspect of the index finger proximal phalanx. 4. Probable remote fracture through an olecranon enthesophyte. Electronically Signed   By: Lesia Hausen M.D.   On: 07/09/2021 15:42    Anti-infectives: Anti-infectives (From admission, onward)    None        Assessment:  Fall from ladder   Mediastinal hematoma Right distal radius fracture/ mildly displaced ulnar styloid  fracture T12 compression fracture/ T11 anterior wedging,  bilateral inferior articular process, and spinous process fx Seizure d/o   Plan: Increase IV fluids Dr. Franky Macho - Neurosurgery recommends brace PT eval Right wrist - Dr. Arita Miss plans surgery on Monday afternoon Adjust pain meds - PRN morphine NPO p MN  LOS: 2 days    Wynona Luna 07/11/2021

## 2021-07-12 ENCOUNTER — Other Ambulatory Visit: Payer: Self-pay | Admitting: Neurosurgery

## 2021-07-12 ENCOUNTER — Encounter (HOSPITAL_COMMUNITY): Admission: EM | Disposition: A | Discharge status: Home Health | Payer: Self-pay | Source: Home / Self Care

## 2021-07-12 ENCOUNTER — Inpatient Hospital Stay (HOSPITAL_COMMUNITY): Payer: Worker's Compensation | Admitting: Anesthesiology

## 2021-07-12 ENCOUNTER — Encounter (HOSPITAL_COMMUNITY): Payer: Self-pay

## 2021-07-12 ENCOUNTER — Other Ambulatory Visit: Payer: Self-pay | Admitting: Surgical

## 2021-07-12 DIAGNOSIS — S52501A Unspecified fracture of the lower end of right radius, initial encounter for closed fracture: Secondary | ICD-10-CM

## 2021-07-12 HISTORY — PX: OPEN REDUCTION INTERNAL FIXATION (ORIF) DISTAL PHALANX: SHX6236

## 2021-07-12 LAB — BASIC METABOLIC PANEL
Anion gap: 10 (ref 5–15)
BUN: 18 mg/dL (ref 6–20)
CO2: 23 mmol/L (ref 22–32)
Calcium: 8.7 mg/dL — ABNORMAL LOW (ref 8.9–10.3)
Chloride: 99 mmol/L (ref 98–111)
Creatinine, Ser: 1.09 mg/dL (ref 0.61–1.24)
GFR, Estimated: >60 mL/min (ref >60)
Glucose, Bld: 93 mg/dL (ref 70–99)
Potassium: 4.1 mmol/L (ref 3.5–5.1)
Sodium: 132 mmol/L — ABNORMAL LOW (ref 135–145)

## 2021-07-12 LAB — SURGICAL PCR SCREEN
MRSA, PCR: NEGATIVE
Staphylococcus aureus: NEGATIVE

## 2021-07-12 LAB — CBC
HCT: 36 % — ABNORMAL LOW (ref 39.0–52.0)
Hemoglobin: 11.8 g/dL — ABNORMAL LOW (ref 13.0–17.0)
MCH: 26.7 pg (ref 26.0–34.0)
MCHC: 32.8 g/dL (ref 30.0–36.0)
MCV: 81.4 fL (ref 80.0–100.0)
Platelets: 265 10*3/uL (ref 150–400)
RBC: 4.42 MIL/uL (ref 4.22–5.81)
RDW: 12.8 % (ref 11.5–15.5)
WBC: 7.3 10*3/uL (ref 4.0–10.5)
nRBC: 0 % (ref 0.0–0.2)

## 2021-07-12 LAB — MAGNESIUM: Magnesium: 2.2 mg/dL (ref 1.7–2.4)

## 2021-07-12 SURGERY — OPEN REDUCTION INTERNAL FIXATION (ORIF) DISTAL PHALANX
Anesthesia: Monitor Anesthesia Care | Laterality: Right

## 2021-07-12 MED ORDER — PHENYLEPHRINE 40 MCG/ML (10ML) SYRINGE FOR IV PUSH (FOR BLOOD PRESSURE SUPPORT)
PREFILLED_SYRINGE | INTRAVENOUS | Status: AC
  Filled 2021-07-12: qty 10

## 2021-07-12 MED ORDER — CLINDAMYCIN PHOSPHATE 900 MG/50ML IV SOLN
900.0000 mg | INTRAVENOUS | Status: AC
  Administered 2021-07-12: 900 mg via INTRAVENOUS
  Filled 2021-07-12 (×2): qty 50

## 2021-07-12 MED ORDER — CHLORHEXIDINE GLUCONATE 0.12 % MT SOLN: 15.0000 mL | Freq: Once | OROMUCOSAL | Status: AC

## 2021-07-12 MED ORDER — ONDANSETRON HCL 4 MG/2ML IJ SOLN
INTRAMUSCULAR | Status: DC | PRN
  Administered 2021-07-12: 4 mg via INTRAVENOUS

## 2021-07-12 MED ORDER — BUPIVACAINE-EPINEPHRINE (PF) 0.25% -1:200000 IJ SOLN
INTRAMUSCULAR | Status: AC
  Filled 2021-07-12: qty 30

## 2021-07-12 MED ORDER — CHLORHEXIDINE GLUCONATE CLOTH 2 % EX PADS: 6.0000 | MEDICATED_PAD | Freq: Once | CUTANEOUS | Status: DC

## 2021-07-12 MED ORDER — FENTANYL CITRATE (PF) 100 MCG/2ML IJ SOLN: 100.0000 ug | Freq: Once | INTRAMUSCULAR | Status: AC

## 2021-07-12 MED ORDER — ONDANSETRON HCL 4 MG/2ML IJ SOLN
INTRAMUSCULAR | Status: AC
  Filled 2021-07-12: qty 2

## 2021-07-12 MED ORDER — EPHEDRINE SULFATE-NACL 50-0.9 MG/10ML-% IV SOSY
PREFILLED_SYRINGE | INTRAVENOUS | Status: DC | PRN
  Administered 2021-07-12: 10 mg via INTRAVENOUS
  Administered 2021-07-12: 5 mg via INTRAVENOUS

## 2021-07-12 MED ORDER — BUPIVACAINE HCL (PF) 0.25 % IJ SOLN
INTRAMUSCULAR | Status: AC
  Filled 2021-07-12: qty 30

## 2021-07-12 MED ORDER — OXYCODONE HCL 5 MG PO TABS
5.0000 mg | ORAL_TABLET | Freq: Once | ORAL | Status: DC | PRN
Start: 2021-07-12 — End: 2021-07-12

## 2021-07-12 MED ORDER — LACTATED RINGERS IV SOLN: INTRAVENOUS | Status: DC

## 2021-07-12 MED ORDER — MIDAZOLAM HCL 2 MG/2ML IJ SOLN
INTRAMUSCULAR | Status: AC
  Administered 2021-07-12: 2 mg via INTRAVENOUS
  Filled 2021-07-12: qty 2

## 2021-07-12 MED ORDER — MORPHINE SULFATE (PF) 2 MG/ML IV SOLN
2.0000 mg | Freq: Four times a day (QID) | INTRAVENOUS | Status: DC | PRN
Start: 2021-07-12 — End: 2021-07-15
  Administered 2021-07-12 – 2021-07-13 (×2): 2 mg via INTRAVENOUS
  Filled 2021-07-12 (×2): qty 1

## 2021-07-12 MED ORDER — BUPIVACAINE-EPINEPHRINE (PF) 0.25% -1:200000 IJ SOLN
INTRAMUSCULAR | Status: DC | PRN
  Administered 2021-07-12: 20 mL

## 2021-07-12 MED ORDER — LACTATED RINGERS IV BOLUS
500.0000 mL | Freq: Once | INTRAVENOUS | Status: AC
  Administered 2021-07-12: 500 mL via INTRAVENOUS

## 2021-07-12 MED ORDER — ORAL CARE MOUTH RINSE: 15.0000 mL | Freq: Once | OROMUCOSAL | Status: AC

## 2021-07-12 MED ORDER — FENTANYL CITRATE (PF) 100 MCG/2ML IJ SOLN: 25.0000 ug | INTRAMUSCULAR | Status: DC | PRN

## 2021-07-12 MED ORDER — CHLORHEXIDINE GLUCONATE CLOTH 2 % EX PADS
6.0000 | MEDICATED_PAD | Freq: Once | CUTANEOUS | Status: AC
  Administered 2021-07-12: 6 via TOPICAL

## 2021-07-12 MED ORDER — CHLORHEXIDINE GLUCONATE 0.12 % MT SOLN
OROMUCOSAL | Status: AC
  Administered 2021-07-12: 15 mL via OROMUCOSAL
  Filled 2021-07-12: qty 15

## 2021-07-12 MED ORDER — ONDANSETRON HCL 4 MG/2ML IJ SOLN: 4.0000 mg | Freq: Once | INTRAMUSCULAR | Status: DC | PRN

## 2021-07-12 MED ORDER — MIDAZOLAM HCL 2 MG/2ML IJ SOLN: 2.0000 mg | Freq: Once | INTRAMUSCULAR | Status: AC

## 2021-07-12 MED ORDER — ROPIVACAINE HCL 5 MG/ML IJ SOLN
INTRAMUSCULAR | Status: DC | PRN
  Administered 2021-07-12: 30 mL via PERINEURAL

## 2021-07-12 MED ORDER — PROPOFOL 500 MG/50ML IV EMUL
INTRAVENOUS | Status: DC | PRN
  Administered 2021-07-12: 50 ug/kg/min via INTRAVENOUS
  Administered 2021-07-12 (×3): 20 mg via INTRAVENOUS

## 2021-07-12 MED ORDER — FENTANYL CITRATE (PF) 100 MCG/2ML IJ SOLN
INTRAMUSCULAR | Status: AC
  Administered 2021-07-12: 100 ug via INTRAVENOUS
  Filled 2021-07-12: qty 2

## 2021-07-12 MED ORDER — BUPIVACAINE HCL 0.25 % IJ SOLN: INTRAMUSCULAR | Status: DC | PRN

## 2021-07-12 MED ORDER — POLYETHYLENE GLYCOL 3350 17 G PO PACK
17.0000 g | PACK | Freq: Every day | ORAL | Status: DC
Start: 2021-07-12 — End: 2021-07-13
  Administered 2021-07-13: 17 g via ORAL
  Filled 2021-07-12: qty 1

## 2021-07-12 MED ORDER — EPHEDRINE 5 MG/ML INJ
INTRAVENOUS | Status: AC
  Filled 2021-07-12: qty 5

## 2021-07-12 MED ORDER — OXYCODONE HCL 5 MG/5ML PO SOLN
5.0000 mg | Freq: Once | ORAL | Status: DC | PRN
Start: 2021-07-12 — End: 2021-07-12

## 2021-07-12 MED ORDER — FENTANYL CITRATE (PF) 100 MCG/2ML IJ SOLN
INTRAMUSCULAR | Status: DC | PRN
  Administered 2021-07-12 (×4): 25 ug via INTRAVENOUS
  Administered 2021-07-12: 50 ug via INTRAVENOUS

## 2021-07-12 MED ORDER — LACTATED RINGERS IV SOLN: INTRAVENOUS | Status: DC | PRN

## 2021-07-12 MED ORDER — FENTANYL CITRATE (PF) 250 MCG/5ML IJ SOLN
INTRAMUSCULAR | Status: AC
  Filled 2021-07-12: qty 5

## 2021-07-12 SURGICAL SUPPLY — 54 items
BAG COUNTER SPONGE SURGICOUNT (BAG) ×2 IMPLANT
BIT DRILL 2.2 SS TIBIAL (BIT) ×1 IMPLANT
BLADE CLIPPER SURG (BLADE) ×2 IMPLANT
BNDG ELASTIC 3X5.8 VLCR STR LF (GAUZE/BANDAGES/DRESSINGS) ×2 IMPLANT
BNDG ELASTIC 4X5.8 VLCR STR LF (GAUZE/BANDAGES/DRESSINGS) ×2 IMPLANT
BNDG ESMARK 4X9 LF (GAUZE/BANDAGES/DRESSINGS) ×2 IMPLANT
BNDG GAUZE ELAST 4 BULKY (GAUZE/BANDAGES/DRESSINGS) ×2 IMPLANT
CANISTER SUCT 3000ML PPV (MISCELLANEOUS) ×2 IMPLANT
CLSR STERI-STRIP ANTIMIC 1/2X4 (GAUZE/BANDAGES/DRESSINGS) ×1 IMPLANT
CORD BIPOLAR FORCEPS 12FT (ELECTRODE) ×2 IMPLANT
COVER SURGICAL LIGHT HANDLE (MISCELLANEOUS) ×2 IMPLANT
CUFF TOURN SGL QUICK 18X4 (TOURNIQUET CUFF) ×2 IMPLANT
DRAPE OEC MINIVIEW 54X84 (DRAPES) ×2 IMPLANT
DRAPE SURG 17X23 STRL (DRAPES) ×2 IMPLANT
GAUZE SPONGE 4X4 12PLY STRL (GAUZE/BANDAGES/DRESSINGS) ×2 IMPLANT
GAUZE SPONGE 4X4 12PLY STRL LF (GAUZE/BANDAGES/DRESSINGS) ×1 IMPLANT
GAUZE XEROFORM 1X8 LF (GAUZE/BANDAGES/DRESSINGS) ×2 IMPLANT
GOWN STRL REUS W/ TWL LRG LVL3 (GOWN DISPOSABLE) ×2 IMPLANT
GOWN STRL REUS W/TWL LRG LVL3 (GOWN DISPOSABLE) ×4
K-WIRE 1.6 (WIRE) ×4
K-WIRE FX5X1.6XNS BN SS (WIRE) ×2
KIT BASIN OR (CUSTOM PROCEDURE TRAY) ×2 IMPLANT
KIT TURNOVER KIT B (KITS) ×2 IMPLANT
KWIRE FX5X1.6XNS BN SS (WIRE) IMPLANT
LOOP VESSEL MAXI BLUE (MISCELLANEOUS) ×2 IMPLANT
NEEDLE 22X1 1/2 (OR ONLY) (NEEDLE) ×2 IMPLANT
NS IRRIG 1000ML POUR BTL (IV SOLUTION) ×2 IMPLANT
PACK ORTHO EXTREMITY (CUSTOM PROCEDURE TRAY) ×2 IMPLANT
PAD ARMBOARD 7.5X6 YLW CONV (MISCELLANEOUS) ×4 IMPLANT
PAD CAST 3X4 CTTN HI CHSV (CAST SUPPLIES) ×1 IMPLANT
PAD CAST 4YDX4 CTTN HI CHSV (CAST SUPPLIES) ×1 IMPLANT
PADDING CAST COTTON 3X4 STRL (CAST SUPPLIES) ×2
PADDING CAST COTTON 4X4 STRL (CAST SUPPLIES) ×2
PEG LOCKING SMOOTH 2.2X18 (Peg) ×5 IMPLANT
PEG LOCKING SMOOTH 2.2X20 (Screw) ×1 IMPLANT
PLATE RIGHT VOLAR RIM DVR (Plate) ×1 IMPLANT
SCREW  LP NL 2.7X15MM (Screw) ×2 IMPLANT
SCREW LOCKING 2.7X15MM (Screw) ×2 IMPLANT
SCREW LP NL 2.7X15MM (Screw) IMPLANT
SCREW MULTI DIRECTIONAL 2.7X18 (Screw) ×2 IMPLANT
SOL PREP PROV IODINE SCRUB 4OZ (MISCELLANEOUS) ×4 IMPLANT
SPLINT PLASTER EXTRA FAST 3X15 (CAST SUPPLIES) ×1
SPLINT PLASTER GYPS XFAST 3X15 (CAST SUPPLIES) IMPLANT
SPONGE T-LAP 4X18 ~~LOC~~+RFID (SPONGE) ×2 IMPLANT
SUT MNCRL AB 4-0 PS2 18 (SUTURE) IMPLANT
SUT VIC AB 3-0 FS2 27 (SUTURE) IMPLANT
SYR CONTROL 10ML LL (SYRINGE) ×2 IMPLANT
SYSTEM CHEST DRAIN TLS 7FR (DRAIN) ×2 IMPLANT
TOWEL GREEN STERILE (TOWEL DISPOSABLE) ×2 IMPLANT
TOWEL GREEN STERILE FF (TOWEL DISPOSABLE) ×2 IMPLANT
TUBE CONNECTING 12X1/4 (SUCTIONS) ×2 IMPLANT
TUBE EVACUATION TLS (MISCELLANEOUS) ×2 IMPLANT
UNDERPAD 30X36 HEAVY ABSORB (UNDERPADS AND DIAPERS) ×2 IMPLANT
WATER STERILE IRR 1000ML POUR (IV SOLUTION) ×2 IMPLANT

## 2021-07-12 NOTE — Interval H&P Note (Signed)
History and Physical Interval Note:  07/12/2021 8:02 AM  Craig Church  has presented today for surgery, with the diagnosis of Radial Fracture Right.  The various methods of treatment have been discussed with the patient and family. After consideration of risks, benefits and other options for treatment, the patient has consented to  Procedure(s): OPEN REDUCTION INTERNAL FIXATION (ORIF) DISTAL RADIUS (Right) as a surgical intervention.  The patient's history has been reviewed, patient examined, no change in status, stable for surgery.  I have reviewed the patient's chart and labs.  Questions were answered to the patient's satisfaction.     Allena Napoleon

## 2021-07-12 NOTE — Progress Notes (Signed)
Patient ID: Craig Church, male   DOB: 12/10/1965, 55 y.o.   MRN: [id number]  [facility] Surgery Progress Note  Day of Surgery  Subjective: CC-  Sore but pain well controlled on current regimen. Has not required IV narcotic in 24 hours. Pain is mostly in his back. Incorrect back brace is in the room.  Going to OR later today with Dr. Arita Miss for his right wrist. No BM since admission. At baseline only has BM every 4 days.  Lives at home with wife. Works in [employer].  Objective: Vital signs in last 24 hours: Temp:  [97.8 F (36.6 C)-98.1 F (36.7 C)] 98.1 F (36.7 C) (09/26 0806) Pulse Rate:  [62-65] 62 (09/26 0806) Resp:  [16-18] 18 (09/26 0806) BP: (106)/(58-68) 106/67 (09/26 0806) SpO2:  [96 %-97 %] 96 % (09/26 0806) Last BM Date: 07/09/21  Intake/Output from previous day: 09/25 0701 - 09/26 0700 In: -  Out: 475 [Urine:475] Intake/Output this shift: No intake/output data recorded.  PE: Gen:  Alert, NAD HEENT: EOM's intact, pupils equal and round Card:  RRR, no M/G/R heard, palpable pedal pulses Pulm:  CTAB, no W/R/R, rate and effort normal Abd: Soft, NT/ND Ext:  calves soft and nontender. Splint to RUE, able to wiggle fingers, good cap refill Psych: A&Ox3, flat affect  Neuro: no gross motor or sensory deficits BUE/BLE Skin: no rashes noted, warm and dry  Lab Results:  Recent Labs    07/10/21 0418 07/10/21 0544  WBC 8.0 8.2  HGB 11.2* 11.7*  HCT 35.7* 35.8*  PLT 261 277   BMET Recent Labs    07/09/21 1630 07/10/21 0418 07/10/21 0544  NA 138 137  --   K 3.3* 3.8  --   CL 109 109  --   CO2 21* 21*  --   GLUCOSE 98 105*  --   BUN 20 15  --   CREATININE 1.07 0.88 0.85  CALCIUM 9.1 8.6*  --    PT/INR Recent Labs    07/09/21 1630  LABPROT 12.4  INR 0.9   CMP     Component Value Date/Time   NA 137 07/10/2021 0418   K 3.8 07/10/2021 0418   CL 109 07/10/2021 0418   CO2 21 (L) 07/10/2021 0418   GLUCOSE 105 (H) 07/10/2021 0418   BUN 15  07/10/2021 0418   CREATININE 0.85 07/10/2021 0544   CALCIUM 8.6 (L) 07/10/2021 0418   GFRNONAA >60 07/10/2021 0544   Lipase  No results found for: LIPASE     Studies/Results: No results found.  Anti-infectives: Anti-infectives (From admission, onward)    Start     Dose/Rate Route Frequency Ordered Stop   07/12/21 0900  clindamycin (CLEOCIN) IVPB 900 mg        900 mg 100 mL/hr over 30 Minutes Intravenous On call to O.R. 07/12/21 0810 07/13/21 0559        Assessment/Plan Fall from ladder   Mediastinal hematoma - EKG Right distal radius fracture/ mildly displaced ulnar styloid fracture - to OR today with Dr. Arita Miss T12 compression fracture/ T11 anterior wedging, T11 bilateral inferior articular process, and T11 spinous process fx - per Dr. Franky Macho, Recommend aspen lumbar brace with thoracic extension when out of bed, may don doff when sitting up. wrong brace is in the room, RN has reached out to NSGY Seizure d/o HTN HLD OSA 8 mm LLL lung nodule- future CT rec 6-12 months.    ID - none FEN - IVF, NPO for procedure VTE -  lovenox (hold today for surgery) Foley - none  Plan: Labs pending. OR today with Dr. Arita Miss. Therapies.   LOS: 3 days    Franne Forts, [facility] Surgery 07/12/2021, 10:05 AM Please see Amion for pager number during day hours 7:00am-4:30pm

## 2021-07-12 NOTE — TOC CAGE-AID Note (Signed)
Transition of Care [facility]) - CAGE-AID Screening   Patient Details  Name: Craig Church MRN: [id number] Date of Birth: [birth year]  Transition of [facility]) CM/SW Contact:    Demesha Boorman C Tarpley-Carter, LCSWA Phone Number: 07/12/2021, 3:44 PM   Clinical Narrative: Pt participated in Cage-Aid.  Pt stated he does not use substance or ETOH.  Pt was not offered resources, due to no usage of substance or ETOH.     Sonam Wandel Tarpley-Carter, MSW, LCSW-A Pronouns:  She/Her/Hers [facility] Clinical Social Worker Direct Number:  [phone] [email or web address]    CAGE-AID Screening:    Have You Ever Felt You Ought to Cut Down on Your Drinking or Drug Use?: No Have People Annoyed You By [employer] Your Drinking Or Drug Use?: No Have You Felt Bad Or Guilty About Your Drinking Or Drug Use?: No Have You Ever Had a Drink or Used Drugs First Thing In The Morning to Steady Your Nerves or to Get Rid of a Hangover?: No CAGE-AID Score: 0  Substance Abuse Education Offered: No

## 2021-07-12 NOTE — Progress Notes (Signed)
Patient ID: Craig Church, male   DOB: December 13, 1965, 55 y.o.   MRN: [id number] BP 126/72 (BP Location: Left Arm)   Pulse 70   Temp (!) 97.5 F (36.4 C) (Oral)   Resp 16   Ht 5\' 11"  (1.803 m)   Wt 99.8 kg   SpO2 90%   BMI 30.69 kg/m  Alert and oriented x 4, speech is clear and fluent Moving lower extremities well Brace in room. May don and doff while sitting up Doing well

## 2021-07-12 NOTE — Brief Op Note (Signed)
07/12/2021  4:52 PM  PATIENT:  Jenel Lucks  55 y.o. male  PRE-OPERATIVE DIAGNOSIS:  Radial Fracture Right  POST-OPERATIVE DIAGNOSIS:  Radial Fracture Right  PROCEDURE:  Procedure(s): OPEN REDUCTION INTERNAL FIXATION (ORIF) DISTAL RADIUS (Right)  SURGEON:  Surgeon(s) and Role:    * Kip Kautzman, Wendy Poet, MD - Primary  PHYSICIAN ASSISTANT: Materials engineer, PA  ASSISTANTS: none   ANESTHESIA:   regional  EBL:  5 mL   BLOOD ADMINISTERED:none  DRAINS: none   LOCAL MEDICATIONS USED:  XYLOCAINE   SPECIMEN:  No Specimen  DISPOSITION OF SPECIMEN:  N/A  COUNTS:  YES  TOURNIQUET:   Total Tourniquet Time Documented: Upper Arm (Right) - 52 minutes Total: Upper Arm (Right) - 52 minutes   DICTATION: .Reubin Milan Dictation  PLAN OF CARE: Discharge to home after PACU  PATIENT DISPOSITION:  PACU - hemodynamically stable.   Delay start of Pharmacological VTE agent (>24hrs) due to surgical blood loss or risk of bleeding: not applicable

## 2021-07-12 NOTE — Progress Notes (Signed)
Patient has home CPAP and places himself on without assistance. RT will monitor as needed. 

## 2021-07-12 NOTE — Anesthesia Preprocedure Evaluation (Addendum)
Anesthesia Evaluation  Patient identified by MRN, date of birth, ID band Patient awake    Reviewed: Allergy & Precautions, NPO status , Patient's Chart, lab work & pertinent test results, reviewed documented beta blocker date and time   Airway Mallampati: II  TM Distance: >3 FB Neck ROM: Full    Dental no notable dental hx. (+) Teeth Intact, Dental Advisory Given   Pulmonary neg pulmonary ROS,    Pulmonary exam normal breath sounds clear to auscultation       Cardiovascular hypertension, Pt. on medications Normal cardiovascular exam Rhythm:Regular Rate:Normal     Neuro/Psych Seizures -, Well Controlled,  Restless legs syndrome  C-spine cleared negative psych ROS   GI/Hepatic Neg liver ROS, GERD  Medicated and Controlled,  Endo/Other  Obesity Hyperlipidemia  Renal/GU negative Renal ROS  negative genitourinary   Musculoskeletal Right distal radius Fx T12 compression Fx T11 spinous process Fx with transverse process Fx   Abdominal (+) + obese,   Peds  Hematology negative hematology ROS (+)   Anesthesia Other Findings   Reproductive/Obstetrics                            Anesthesia Physical Anesthesia Plan  ASA: 2  Anesthesia Plan: MAC and Regional   Post-op Pain Management:    Induction: Intravenous  PONV Risk Score and Plan: 2 and Treatment may vary due to age or medical condition, Propofol infusion, Ondansetron and Midazolam  Airway Management Planned: Natural Airway and Simple Face Mask  Additional Equipment:   Intra-op Plan:   Post-operative Plan:   Informed Consent: I have reviewed the patients History and Physical, chart, labs and discussed the procedure including the risks, benefits and alternatives for the proposed anesthesia with the patient or authorized representative who has indicated his/her understanding and acceptance.     Dental advisory given  Plan Discussed  with: Anesthesiologist and CRNA  Anesthesia Plan Comments:         Anesthesia Quick Evaluation

## 2021-07-12 NOTE — Progress Notes (Signed)
PT Cancellation Note  Patient Details Name: Craig Church MRN: [id number] DOB: 1965/11/07   Cancelled Treatment:    Reason Eval/Treat Not Completed: Other (comment) (Pt awaiting brace and surgery this pm.  will check back tomorrow.  Thanks.)   Bevelyn Buckles 07/12/2021, 10:44 AM Fahd Galea M,PT Acute Rehab Services [phone] (pager)

## 2021-07-12 NOTE — Anesthesia Procedure Notes (Signed)
Anesthesia Regional Block: Supraclavicular block   Pre-Anesthetic Checklist: , timeout performed,  Correct Patient, Correct Site, Correct Laterality,  Correct Procedure, Correct Position, site marked,  Risks and benefits discussed,  Surgical consent,  Pre-op evaluation,  At surgeon's request  Laterality: Right  Prep: chloraprep       Needles:  Injection technique: Single-shot  Needle Type: Echogenic Stimulator Needle     Needle Length: 10cm  Needle Gauge: 21   Needle insertion depth: 7 cm   Additional Needles:   Procedures:,,,, ultrasound used (permanent image in chart),,    Narrative:  Start time: 07/12/2021 2:45 PM End time: 07/12/2021 2:50 PM Injection made incrementally with aspirations every 5 mL.  Performed by: Personally  Anesthesiologist: Mal Amabile, MD  Additional Notes: Timeout performed. Patient sedated. Relevant anatomy ID'd using [place]. Incremental 2-46ml injection of LA with frequent aspiration. Patient tolerated procedure well.    Right Supraclavicular Block

## 2021-07-12 NOTE — Op Note (Signed)
Operative Note   DATE OF OPERATION: 07/12/2021  SURGICAL DEPARTMENT: Plastic Surgery  PREOPERATIVE DIAGNOSES: Right distal radius fracture  POSTOPERATIVE DIAGNOSES:  same  PROCEDURE: Open reduction internal fixation comminuted intra-articular greater than 3 fragment right distal radius fracture  SURGEON: Ancil Linsey, MD  ASSISTANT: Zadie Cleverly, PA The advanced practice practitioner (APP) assisted throughout the case.  The APP was essential in retraction and counter traction when needed to make the case progress smoothly.  This retraction and assistance made it possible to see the tissue plans for the procedure.  The assistance was needed for blood control, tissue re-approximation and assisted with closure of the incision site.  ANESTHESIA:  General.   COMPLICATIONS: None.   INDICATIONS FOR PROCEDURE:  The patient, Craig Church is a 55 y.o. male born on Feb 17, 1966, is here for treatment of right distal radius fracture MRN: [id number]  CONSENT:  Informed consent was obtained directly from the patient. Risks, benefits and alternatives were fully discussed. Specific risks including but not limited to bleeding, infection, hematoma, seroma, scarring, pain, contracture, asymmetry, wound healing problems, and need for further surgery were all discussed. The patient did have an ample opportunity to have questions answered to satisfaction.   DESCRIPTION OF PROCEDURE:  The patient was taken to the operating room. SCDs were placed and antibiotics were given.  General anesthesia was administered.  The patient's operative site was prepped and draped in a sterile fashion. A time out was performed and all information was confirmed to be correct.  Started by exsanguinating the arm with Esmarch.  Tourniquet was inflated to 250 mmHg.  FCR approach was then carried out with a 15 blade and tenotomy dissection down to the FCR.  Superficial sheath was then released proximally distally.  FCR was retracted  ulnarly.  Sub sheath was incised and extended proximally distally.  Blunt dissection was carried out down to the distal radius and 15 blade was used to release the pronator quadratus off the radial border of the distal radius.  Elevator was then used to do subperiosteal dissection to identify the fracture.  Brachial radialis was then released off the radial styloid to facilitate reduction.  Finger traps were then placed with 5 pounds on and off the end of the bed for traction.  Elevator was then used to insert into the fracture itself to facilitate reduction which was carried out.  This was a highly comminuted distal radius fracture with multiple intra-articular components.  A [employer] standard volar rim DVR cross lock plate was then secured into position with K wires and position was confirmed.  I started by drilling the oblong hole proximally and a nonlocking screw was placed there to secure the plate.  Distal holes were then drilled and filled with a combination of locking pegs and multidirectional screws when necessary.  All K wires were removed.  Proximal screws were then placed by drilling and filling with locking screws.  This gave satisfactory reduction of the flexion fracture with appropriate hardware placement that was confirmed upon multiple views.  Lidocaine with epinephrine was injected at the field.  Tourniquet was let down.  Tourniquet time was 52 minutes.  Hemostasis was ensured with bipolar.  Closure was then carried out with erupted buried 4-0 Monocryl sutures and a running Monocryl subcuticular.  Steri-Strips and a volar splint were applied.  The patient tolerated the procedure well.  There were no complications. The patient was allowed to wake from anesthesia, extubated and taken to the recovery room in satisfactory  condition.

## 2021-07-12 NOTE — Progress Notes (Signed)
55 year old male status post open reduction internal fixation of right distal radius fracture with Dr. Arita Miss today on 07/12/2021.  Splint to remain in place for 1 week, patient can then have a splint made by OT if discharged prior to 1 week. If patient is hospitalized greater than 1 week, can have a splint made in-house.  Recommend leaving splint in place for the next week

## 2021-07-12 NOTE — Interval H&P Note (Signed)
History and Physical Interval Note:  07/12/2021 3:04 PM  Craig Church  has presented today for surgery, with the diagnosis of Radial Fracture Right.  The various methods of treatment have been discussed with the patient and family. After consideration of risks, benefits and other options for treatment, the patient has consented to  Procedure(s): OPEN REDUCTION INTERNAL FIXATION (ORIF) DISTAL RADIUS (Right) as a surgical intervention.  The patient's history has been reviewed, patient examined, no change in status, stable for surgery.  I have reviewed the patient's chart and labs.  Questions were answered to the patient's satisfaction.     Craig Church

## 2021-07-12 NOTE — Transfer of Care (Signed)
Immediate Anesthesia Transfer of Care Note  Patient: Supreme Rybarczyk  Procedure(s) Performed: OPEN REDUCTION INTERNAL FIXATION (ORIF) DISTAL RADIUS (Right)  Patient Location: PACU  Anesthesia Type:MAC combined with regional for post-op pain  Level of Consciousness: awake, alert , oriented and patient cooperative  Airway & Oxygen Therapy: Patient Spontanous Breathing and Patient connected to face mask oxygen  Post-op Assessment: Report given to RN and Post -op Vital signs reviewed and stable  Post vital signs: Reviewed and stable  Last Vitals:  Vitals Value Taken Time  BP 124/71 07/12/21 1654  Temp    Pulse 74 07/12/21 1655  Resp 17 07/12/21 1655  SpO2 98 % 07/12/21 1655  Vitals shown include unvalidated device data.  Last Pain:  Vitals:   07/12/21 1434  TempSrc: Oral  PainSc: 5       Patients Stated Pain Goal: 4 (29/56/21 3086)  Complications: No notable events documented.

## 2021-07-12 NOTE — Progress Notes (Signed)
Patient having some sinus tach sustaining 118 - 120 range. Trauma MD notified. EKG and LR bolus ordered

## 2021-07-13 MED ORDER — POLYETHYLENE GLYCOL 3350 17 G PO PACK
17.0000 g | PACK | Freq: Two times a day (BID) | ORAL | Status: DC
  Administered 2021-07-14 – 2021-07-18 (×4): 17 g via ORAL
  Filled 2021-07-13 (×12): qty 1

## 2021-07-13 NOTE — Anesthesia Postprocedure Evaluation (Signed)
Anesthesia Post Note  Patient: Craig Church  Procedure(s) Performed: OPEN REDUCTION INTERNAL FIXATION (ORIF) DISTAL RADIUS (Right)     Patient location during evaluation: PACU Anesthesia Type: Regional Level of consciousness: awake and alert Pain management: pain level controlled Vital Signs Assessment: post-procedure vital signs reviewed and stable Respiratory status: spontaneous breathing Cardiovascular status: stable Anesthetic complications: no   No notable events documented.  Last Vitals:  Vitals:   07/12/21 2300 07/13/21 0829  BP:  102/62  Pulse: (!) 108 71  Resp:  17  Temp:  36.9 C  SpO2:  96%    Last Pain:  Vitals:   07/13/21 0829  TempSrc: Oral  PainSc:                  Lewie Loron

## 2021-07-13 NOTE — Progress Notes (Addendum)
1 Day Post-Op  Subjective: CC: Patient s/p ORIF with Dr. Arita Miss yesterday or R distal radius fx. Patient complains of only pain in his right wrist. No n/t. No other areas of pain. Had a cheeseburger for dinner yesterday. No abdominal pain, n/v. Passing flatus. No BM still. Voiding. On 1L. No sob or cp. He has not gotten oob. Lives at home with his wife. Works in a Glass blower/designer.   Objective: Vital signs in last 24 hours: Temp:  [97 F (36.1 C)-98.6 F (37 C)] 98.4 F (36.9 C) (09/27 0829) Pulse Rate:  [64-118] 71 (09/27 0829) Resp:  [10-20] 17 (09/27 0829) BP: (102-128)/(56-79) 102/62 (09/27 0829) SpO2:  [90 %-97 %] 96 % (09/27 0829) Weight:  [99.8 kg] 99.8 kg (09/26 1434) Last BM Date: 07/09/21  Intake/Output from previous day: 09/26 0701 - 09/27 0700 In: 1000 [I.V.:1000] Out: 5 [Blood:5] Intake/Output this shift: Total I/O In: -  Out: 600 [Urine:600]  PE: Gen:  Alert, NAD HEENT: EOM's intact, pupils equal and round Card:  RRR, no M/G/R heard, palpable pedal pulses Pulm:  CTAB, no W/R/R, rate and effort normal Abd: Soft, NT/ND Ext:  calves soft and nontender. Splint to RUE, able to wiggle fingers, good cap refill Psych: A&Ox3, flat affect  Neuro: No gross motor or sensory deficits BUE/BLE Skin: no rashes noted, warm and dry  Lab Results:  Recent Labs    07/12/21 0921  WBC 7.3  HGB 11.8*  HCT 36.0*  PLT 265   BMET Recent Labs    07/12/21 0921  NA 132*  K 4.1  CL 99  CO2 23  GLUCOSE 93  BUN 18  CREATININE 1.09  CALCIUM 8.7*   PT/INR No results for input(s): LABPROT, INR in the last 72 hours. CMP     Component Value Date/Time   NA 132 (L) 07/12/2021 0921   K 4.1 07/12/2021 0921   CL 99 07/12/2021 0921   CO2 23 07/12/2021 0921   GLUCOSE 93 07/12/2021 0921   BUN 18 07/12/2021 0921   CREATININE 1.09 07/12/2021 0921   CALCIUM 8.7 (L) 07/12/2021 0921   GFRNONAA >60 07/12/2021 0921   Lipase  No results found for: LIPASE  Studies/Results: No  results found.  Anti-infectives: Anti-infectives (From admission, onward)    Start     Dose/Rate Route Frequency Ordered Stop   07/12/21 0900  clindamycin (CLEOCIN) IVPB 900 mg        900 mg 100 mL/hr over 30 Minutes Intravenous On call to O.R. 07/12/21 0810 07/12/21 1516        Assessment/Plan Fall from ladder Mediastinal hematoma - EKG NSR Right distal radius fracture/ mildly displaced ulnar styloid fracture - s/p ORIF w/ Dr. Arita Miss 9/26. PT/OT. Splint to remain in place for 1 week, patient can then have a splint made by OT if discharged prior to 1 week T12 compression fracture/ T11 anterior wedging, T11 bilateral inferior articular process, and T11 spinous process fx - per Dr. Franky Macho, Recommend aspen lumbar brace with thoracic extension when out of bed, may don doff when sitting up.  Seizure d/o HTN HLD OSA  8 mm LLL lung nodule- future CT rec 6-12 months.  CT w/ 0.6 cm likely colloid cyst within the region of the foramen of Monro and Partial empty sella - will clarify but suspect can f/u w/ NSGY for this ID - none FEN - [employer] Miralax to BID VTE - lovenox and ASA Foley - none Plan: Therapies   LOS:  4 days    Jacinto Halim , [facility] Surgery 07/13/2021, 9:40 AM Please see Amion for pager number during day hours 7:00am-4:30pm

## 2021-07-13 NOTE — Progress Notes (Signed)
Pt placed on home CPAP. RT will cont to monitor.  

## 2021-07-13 NOTE — Progress Notes (Signed)
Pt was seen for mobility with fit of TLSO being an issue.  Contacted the brace supplier for hosp and received extenders for the TLSO to ease the fit and improve stabilization.  Pt is in chair at end of session and motivated to be up and moving.  Follow along with him to increase standing tolerance and gait, and awaiting permission from surgeon to use platform walker with pt.  This functionality may upgrade his tolerance for mobility and will decrease his time in rehab setting to get more independent hopefully.  Encourage him to be OOB and instruct nursing staff on assisting him within limits of permission.  07/13/21 1600  PT Visit Information  Last PT Received On 07/13/21  Assistance Needed +1 (2 for longer first walk)  History of Present Illness 55 yo male with onset of fall at work off a ladder was brought to hosp on 9/23 and received ORIF for R distal radial fracture on 9/26, mildly displaced R ulnar styloid fracture not requiring sx.  Received TLSO for spinal injuries of T12 comp fx, T11 anterior wedging, B inferior articular process and spinous process fractures.  PMHx:  HA, colloid cyst of brain, idiopathic intracranial hypertension, LE edema, lumbar spondylosis and radiculopathy, chestpain, HLD  Precautions  Precautions Fall;Back;Other (comment) (RUE NWB)  Precaution Booklet Issued No  Precaution Comments reviewed precautions  Splint/Cast - Date Prophylactic Dressing Applied (if applicable) 07/13/21  Restrictions  Weight Bearing Restrictions Yes  RUE Weight Bearing NWB  Other Position/Activity Restrictions no guidance on WB through elbow yet  Home Living  Family/patient expects to be discharged to: Private residence  Living Arrangements Spouse/significant other  Available Help at Discharge Family;Available PRN/intermittently;Available 24 hours/day  Type of Home House  Home Access Stairs to enter  Entrance Stairs-Number of Steps 4  Entrance Stairs-Rails Can reach both  Home Layout One  level  Bathroom Environmental health practitioner None  Additional Comments was independent with all gait  Prior Function  Level of Independence Independent  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 No difficulties  Pain Assessment  Pain Assessment Faces  Faces Pain Scale 4  Pain Location R wrist and back  Pain Descriptors / Indicators Grimacing;Guarding  Pain Intervention(s) Monitored during session;Repositioned  Cognition  Arousal/Alertness Awake/alert  Behavior During Therapy WFL for tasks assessed/performed  Overall Cognitive Status Within Functional Limits for tasks assessed  Upper Extremity Assessment  Upper Extremity Assessment RUE deficits/detail  RUE Deficits / Details wrist fracture with ORIF done  RUE Unable to fully assess due to immobilization  RUE Coordination decreased gross motor;decreased fine motor  Lower Extremity Assessment  Lower Extremity Assessment Generalized weakness  Cervical / Trunk Assessment  Cervical / Trunk Assessment Other exceptions (new fractures at T11 and T12)  Cervical / Trunk Exceptions TLSO for mobility  Bed Mobility  Overal bed mobility Needs Assistance  Bed Mobility Supine to Sit  Supine to sit Mod assist  General bed mobility comments mod assist for mobility with sidelying to sit  Transfers  Overall transfer level Needs assistance  Equipment used 1 person hand held assist  Transfers Sit to/from [employer] Transfers  Sit to Stand Mod assist  Stand pivot transfers Mod assist  General transfer comment pt requires a Platform walker but does not have permission to use yet  Ambulation/Gait  General Gait Details pt is unsteady and weak, does not have approval from the MD to use R elbow on platform walker  Gait velocity reduced  Balance  Overall balance assessment Needs assistance;History of Falls  Sitting-balance support Feet supported  Sitting balance-Leahy  Scale Fair  Standing balance support  (PT directly assisting on brace)  Standing balance-Leahy Scale Poor  General Comments  General comments (skin integrity, edema, etc.) pt is up to stand and sidestep to chair with difficulty as he requires UE support and cannot yet use platform walker.  Have sent messages with nursing and in Epic to get permission for WB on R elbow  Exercises  Exercises Other exercises (LE strength about 4+)  PT - End of Session  Equipment Utilized During Treatment Gait belt;Other (comment) (TLSO)  Activity Tolerance Patient limited by fatigue;Treatment limited secondary to medical complications (Comment)  Patient left in chair;with call bell/phone within reach;with chair alarm set;with family/visitor present;with nursing/sitter in room  Nurse Communication Mobility status  PT Assessment  PT Recommendation/Assessment Patient needs continued PT services  PT Visit Diagnosis Unsteadiness on feet (R26.81);Muscle weakness (generalized) (M62.81);History of falling (Z91.81);Pain  Pain - Right/Left Right  Pain - part of body Arm  PT Problem List Decreased strength;Decreased range of motion;Decreased activity tolerance;Decreased balance;Decreased mobility;Decreased coordination;Decreased skin integrity;Pain  Barriers to Discharge Inaccessible home environment;Decreased caregiver support  Barriers to Discharge Comments home with limited help and has stairs to enter  PT Plan  PT Frequency (ACUTE ONLY) Min 4X/week  PT Treatment/Interventions (ACUTE ONLY) DME instruction;Gait training;Stair training;Functional mobility training;Therapeutic activities;Therapeutic exercise;Neuromuscular re-education;Balance training;Patient/family education  AM-PAC PT "6 Clicks" Mobility Outcome Measure (Version 2)  Help needed turning from your back to your side while in a flat bed without using bedrails? 2  Help needed moving from lying on your back to sitting on the side of a flat bed without  using bedrails? 2  Help needed moving to and from a bed to a chair (including a wheelchair)? 2  Help needed standing up from a chair using your arms (e.g., wheelchair or bedside chair)? 2  Help needed to walk in hospital room? 1  Help needed climbing 3-5 steps with a railing?  1  6 Click Score 10  Consider Recommendation of Discharge To: CIR/SNF/LTACH  Progressive Mobility  Mobility Out of bed to chair with meals  PT Recommendation  Recommendations for Other Services Rehab consult  Follow Up Recommendations CIR  PT equipment None recommended by PT  Individuals Consulted  Consulted and Agree with Results and Recommendations Patient  Acute Rehab PT Goals  Patient Stated Goal to walk more independently and get home  PT Goal Formulation With patient  Time For Goal Achievement 07/27/21  Potential to Achieve Goals Good  PT Time Calculation  PT Start Time (ACUTE ONLY) 1403  PT Stop Time (ACUTE ONLY) 1447  PT Time Calculation (min) (ACUTE ONLY) 44 min  PT General Charges  $$ ACUTE PT VISIT 1 Visit  PT Evaluation  $PT Eval Moderate Complexity 1 Mod  PT Treatments  $Therapeutic Activity 23-37 mins  Written Expression  Dominant Hand Right    Samul Dada, PT MS Acute Rehab Dept. Number: [facility] [id number] and [facility] [phone]

## 2021-07-13 NOTE — Progress Notes (Signed)
Patient ID: Craig Church, male   DOB: 1966/01/30, 55 y.o.   MRN: [id number] BP 116/76 (BP Location: Left Arm)   Pulse 71   Temp 97.7 F (36.5 C) (Oral)   Resp 16   Ht 5\' 11"  (1.803 m)   Wt 99.8 kg   SpO2 96%   BMI 30.69 kg/m  Ordered films for tomorrow.  Will clear then allow to be up if no change in alignment on up and down films.  Normal strength in lower extremities

## 2021-07-14 ENCOUNTER — Inpatient Hospital Stay (HOSPITAL_COMMUNITY): Payer: Worker's Compensation

## 2021-07-14 ENCOUNTER — Encounter (HOSPITAL_COMMUNITY): Payer: Self-pay | Admitting: Plastic Surgery

## 2021-07-14 NOTE — Plan of Care (Signed)
  Problem: Education: Goal: Knowledge of General Education information will improve Description: Including pain rating scale, medication(s)/side effects and non-pharmacologic comfort measures Outcome: Progressing   Problem: Health Behavior/Discharge Planning: Goal: Ability to manage health-related needs will improve Outcome: Progressing   Problem: Clinical Measurements: Goal: Ability to maintain clinical measurements within normal limits will improve Outcome: Progressing Goal: Will remain free from infection Outcome: Progressing Goal: Diagnostic test results will improve Outcome: Progressing Goal: Respiratory complications will improve Outcome: Progressing Goal: Cardiovascular complication will be avoided Outcome: Progressing   Problem: Activity: Goal: Risk for activity intolerance will decrease Outcome: Progressing   Problem: Nutrition: Goal: Adequate nutrition will be maintained Outcome: Progressing   Problem: Coping: Goal: Level of anxiety will decrease Outcome: Progressing   Problem: Elimination: Goal: Will not experience complications related to bowel motility Outcome: Not Progressing Goal: Will not experience complications related to urinary retention Outcome: Progressing   Problem: Pain Managment: Goal: General experience of comfort will improve Outcome: Progressing   Problem: Safety: Goal: Ability to remain free from injury will improve Outcome: Progressing   Problem: Skin Integrity: Goal: Risk for impaired skin integrity will decrease Outcome: Progressing    Progressing towards goals as outlined above. Bowel regimen ordered and patient educated on need to take medications - verbalizes understanding. Report received and care assumed from previous shift RN. VS obtained, shift assessments completed - see flowsheets. Scheduled pain medications given for c/o pain to R wrist and back - see MAR; patient states adequate relief from medications and denies need for  further intervention. Turns and repositions self in bed. R wrist splint/ACE intact. Currently resting in bed, bed in lowest position. Denies needs. Call bell within reach.

## 2021-07-14 NOTE — Evaluation (Addendum)
Occupational Therapy Evaluation Patient Details Name: Craig Church MRN: [id number] DOB: 04-21-1966 Today's Date: 07/14/2021   History of Present Illness 55 yo male with onset of fall at work off a ladder was brought to hosp on 9/23 and received ORIF for R distal radial fracture on 9/26, mildly displaced R ulnar styloid fracture not requiring sx.  Received TLSO for spinal injuries of T12 comp fx, T11 anterior wedging, B inferior articular process and spinous process fractures.  PMHx:  HA, colloid cyst of brain, idiopathic intracranial hypertension, LE edema, lumbar spondylosis and radiculopathy, chestpain, HLD   Clinical Impression   PTA patient independent and working. Admitted for above and presenting with problem list below including: R wrist NWB and decreased functional use, back precautions and pain, impaired balance and decreased activity tolerance.  Patient currently requires min assist for transfers, up to max assist for ADLs.  Educated on compensatory techniques for ADLs, safety and DME, as well as back precautions/brace mgmt.  Verbal order from Keenan Bachelor, PA-C for WBAT to R elbow but NWB to wrist; maintaining splint for 8 days then transitioning to volar splint.  Pt will benefit from continued OT services while admitted and after dc at CIR level to optimize independence, safety with ADLs and mobility prior to dc home. Will follow.      Recommendations for follow up therapy are one component of a multi-disciplinary discharge planning process, led by the attending physician.  Recommendations may be updated based on patient status, additional functional criteria and insurance authorization.   Follow Up Recommendations  CIR;Supervision/Assistance - 24 hour    Equipment Recommendations  3 in 1 bedside commode    Recommendations for Other Services Rehab consult     Precautions / Restrictions Precautions Precautions: Fall;Back Precaution Booklet Issued: No Precaution Comments:  reviewed precautions with pt, pt with no recall Required Braces or Orthoses: Splint/Cast Splint/Cast: to R UE Restrictions Weight Bearing Restrictions: Yes RUE Weight Bearing: Weight bear through elbow only Other Position/Activity Restrictions: verbal order from Scheeler, Kermit Balo, PA-C: weightbearing through elbow only; NWB wrist      Mobility Bed Mobility               General bed mobility comments: OOB upon entry    Transfers Overall transfer level: Needs assistance   Transfers: Sit to/from Stand Sit to Stand: Min assist         General transfer comment: min assist to power up and steady with cueing for hand placement and posture    Balance Overall balance assessment: Needs assistance;History of Falls Sitting-balance support: Feet supported Sitting balance-Leahy Scale: Fair     Standing balance support: No upper extremity supported;During functional activity;Single extremity supported Standing balance-Leahy Scale: Poor Standing balance comment: relies on L UE support dynamically                           ADL either performed or assessed with clinical judgement   ADL Overall ADL's : Needs assistance/impaired     Grooming: Minimal assistance;Sitting   Upper Body Bathing: Sitting;Moderate assistance   Lower Body Bathing: Maximal assistance;Sit to/from stand   Upper Body Dressing : Moderate assistance;Sitting   Lower Body Dressing: Maximal assistance;Sit to/from stand Lower Body Dressing Details (indicate cue type and reason): requires assist to don socks, able to doff using 1 handed technique; min assist sit to stand Toilet Transfer: Minimal assistance Toilet Transfer Details (indicate cue type and reason): simulated in room, pt exiting  bathroom with nursing upon entry Toileting- Clothing Manipulation and Hygiene: Sit to/from stand;Moderate assistance       Functional mobility during ADLs: Minimal assistance;Cueing for safety General ADL  Comments: pt limited by R UE functional use due to wrist sx, back pain and precautions, balance and decreased activity tolerance     Vision   Vision Assessment?: No apparent visual deficits     Perception     Praxis      Pertinent Vitals/Pain Pain Assessment: Faces Faces Pain Scale: Hurts little more Pain Location: R wrist and back Pain Descriptors / Indicators: Grimacing;Guarding Pain Intervention(s): Limited activity within patient's tolerance;Monitored during session;Repositioned     Hand Dominance Left   Extremity/Trunk Assessment Upper Extremity Assessment Upper Extremity Assessment: RUE deficits/detail RUE Deficits / Details: wrist ORIF in splint, WFL elbow and shoulder, able to wiggle fingers RUE: Unable to fully assess due to immobilization RUE Sensation: decreased light touch (reports tingling in fingers) RUE Coordination: decreased fine motor   Lower Extremity Assessment Lower Extremity Assessment: Defer to PT evaluation   Cervical / Trunk Assessment Cervical / Trunk Assessment: Other exceptions Cervical / Trunk Exceptions: TLSO d/t fractures   Communication Communication Communication: No difficulties   Cognition Arousal/Alertness: Awake/alert Behavior During Therapy: WFL for tasks assessed/performed Overall Cognitive Status: Within Functional Limits for tasks assessed                                     General Comments  pt fatigued from just using restroom with nursing assist but agreeable to OT session; VSS during session.  Educated pt on brace mgmt and R UE elevation.    Exercises     Shoulder Instructions      Home Living Family/patient expects to be discharged to:: Private residence Living Arrangements: Spouse/significant other Available Help at Discharge: Family;Available PRN/intermittently Type of Home: House Home Access: Stairs to enter [employer] of Steps: 4 Entrance Stairs-Rails: Can reach both Home Layout:  One level     Bathroom Shower/Tub: Chief Strategy Officer: Standard     Home Equipment: None          Prior Functioning/Environment Level of Independence: Independent        Comments: independent and working        OT Problem List: Decreased strength;Decreased activity tolerance;Impaired balance (sitting and/or standing);Decreased safety awareness;Decreased knowledge of use of DME or AE;Decreased knowledge of precautions;Pain;Impaired UE functional use;Impaired sensation;Obesity;Decreased coordination      OT Treatment/Interventions: Self-care/ADL training;Therapeutic exercise;Therapeutic activities;Patient/family education;Balance training;Energy conservation;DME and/or AE instruction    OT Goals(Current goals can be found in the care plan section) Acute Rehab OT Goals Patient Stated Goal: to get home OT Goal Formulation: With patient Time For Goal Achievement: 07/29/21 Potential to Achieve Goals: Good  OT Frequency: Min 2X/week   Barriers to D/C:            Co-evaluation              AM-PAC OT "6 Clicks" Daily Activity     Outcome Measure Help from another person eating meals?: A Little Help from another person taking care of personal grooming?: A Little Help from another person toileting, which includes using toliet, bedpan, or urinal?: A Lot Help from another person bathing (including washing, rinsing, drying)?: A Lot Help from another person to put on and taking off regular upper body clothing?: A Lot Help from another person  to put on and taking off regular lower body clothing?: A Lot 6 Click Score: 14   End of Session Equipment Utilized During Treatment: Back brace Nurse Communication: Mobility status  Activity Tolerance: Patient tolerated treatment well Patient left: in chair;with call bell/phone within reach;with chair alarm set  OT Visit Diagnosis: Other abnormalities of gait and mobility (R26.89);Muscle weakness (generalized)  (M62.81);Pain Pain - Right/Left: Right Pain - part of body: Hand;Arm                Time: 8592-9244 OT Time Calculation (min): 20 min Charges:  OT General Charges $OT Visit: 1 Visit OT Evaluation $OT Eval Moderate Complexity: 1 Mod  Barry Brunner, OT Acute Rehabilitation Services Pager [phone] Office [phone]   Chancy Milroy 07/14/2021, 11:08 AM

## 2021-07-14 NOTE — Progress Notes (Signed)
Patient ID: Craig Church, male   DOB: 06-16-1966, 55 y.o.   MRN: [id number] BP 119/66 (BP Location: Left Arm)   Pulse 75   Temp 97.7 F (36.5 C) (Oral)   Resp 16   Ht 5\' 11"  (1.803 m)   Wt 99.8 kg   SpO2 97%   BMI 30.69 kg/m  Films reviewed, ok to be up

## 2021-07-14 NOTE — Progress Notes (Signed)
2 Days Post-Op  Subjective: CC: Pain in R wrist that is well controlled. No numbness. Moves all digits. Some back soreness. Up in chair this am with brace. No other areas of pain. Tolerating diet without abdominal pain, n/v. Passing flatus. No Bm. Voiding. Wife was on the phone and he asked me to give an update.   Objective: Vital signs in last 24 hours: Temp:  [97.7 F (36.5 C)-98.4 F (36.9 C)] 97.7 F (36.5 C) (09/28 0809) Pulse Rate:  [71-80] 75 (09/28 0809) Resp:  [15-17] 16 (09/28 0809) BP: (105-120)/(62-76) 119/66 (09/28 0809) SpO2:  [94 %-97 %] 97 % (09/28 0809) Last BM Date: 07/09/21  Intake/Output from previous day: 09/27 0701 - 09/28 0700 In: 1790 [P.O.:790; I.V.:1000] Out: 2700 [Urine:2700] Intake/Output this shift: No intake/output data recorded.  PE: Gen:  Alert, NAD, up in chair with back brace.  HEENT: EOM's intact, pupils equal and round Card:  RRR, no M/G/R heard, palpable pedal pulses Pulm:  CTAB, no W/R/R, rate and effort normal Abd: Soft, NT/ND Ext:  calves soft and nontender. Splint to RUE, able to wiggle fingers, good cap refill Psych: A&Ox3, flat affect  Neuro: No gross motor or sensory deficits BUE/BLE Skin: no rashes noted, warm and dry  Lab Results:  Recent Labs    07/12/21 0921  WBC 7.3  HGB 11.8*  HCT 36.0*  PLT 265   BMET Recent Labs    07/12/21 0921  NA 132*  K 4.1  CL 99  CO2 23  GLUCOSE 93  BUN 18  CREATININE 1.09  CALCIUM 8.7*   PT/INR No results for input(s): LABPROT, INR in the last 72 hours. CMP     Component Value Date/Time   NA 132 (L) 07/12/2021 0921   K 4.1 07/12/2021 0921   CL 99 07/12/2021 0921   CO2 23 07/12/2021 0921   GLUCOSE 93 07/12/2021 0921   BUN 18 07/12/2021 0921   CREATININE 1.09 07/12/2021 0921   CALCIUM 8.7 (L) 07/12/2021 0921   GFRNONAA >60 07/12/2021 0921   Lipase  No results found for: LIPASE  Studies/Results: No results found.  Anti-infectives: Anti-infectives (From  admission, onward)    Start     Dose/Rate Route Frequency Ordered Stop   07/12/21 0900  clindamycin (CLEOCIN) IVPB 900 mg        900 mg 100 mL/hr over 30 Minutes Intravenous On call to O.R. 07/12/21 0810 07/12/21 1516        Assessment/Plan Fall from ladder Mediastinal hematoma - EKG NSR Right distal radius fracture/ mildly displaced ulnar styloid fracture - s/p ORIF w/ Dr. Arita Miss 9/26. PT/OT. Splint to remain in place for 1 week, patient can then have a splint made by OT if discharged prior to 1 week. I have clarified with Dr. Thomos Lemons team that patient is WBAT through elbow and NWB from elbow down  T12 compression fracture/ T11 anterior wedging, T11 bilateral inferior articular process, and T11 spinous process fx - per Dr. Franky Macho, Recommend aspen lumbar brace with thoracic extension when out of bed, may don doff when sitting up. Repeat films are pending this am. Spoke with Dr. Franky Macho who will review and provide updated recs.  Seizure d/o HTN HLD OSA  8 mm LLL lung nodule- future CT rec 6-12 months.  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 Bowel regimen VTE - lovenox and ASA  Foley - none Plan: Therapies. Currently rec CIR. Will see how he does after updated NSGY recs and with platform walker now that he can wbat through elbow. He reports he would have 24/7 support after discharge between wife and mother in law if needed.    LOS: 5 days    Jacinto Halim , [facility] 07/14/2021, 9:37 AM Please see Amion for pager number during day hours 7:00am-4:30pm

## 2021-07-14 NOTE — Progress Notes (Signed)
Inpatient Rehab Admissions Coordinator:   Per therapy recommendations,  patient was screened for CIR candidacy by Rogena Deupree, MS, CCC-SLP . At this time, Pt. Appears to demonstrate medical necessity, functional decline, and ability to tolerate intensity of CIR. Pt. is a potential candidate for CIR. I will place   order for rehab consult per protocol for full assessment. Please contact me any with questions..  Jerrye Seebeck, MS, CCC-SLP Rehab Admissions Coordinator  [phone] (celll) [phone] (office)  

## 2021-07-15 NOTE — Progress Notes (Signed)
Pt has home cpap machine in room. Pt stated that family member staying in room will place pt on cpap for tonight.

## 2021-07-15 NOTE — Progress Notes (Signed)
Patients home CPAP at bedside. Patient waiting on RN to give meds in 10 minutes and will have RN turn it on once done.

## 2021-07-15 NOTE — Progress Notes (Signed)
IP rehab admissions - I met with patient at the bedside.  I gave him rehab booklets and explained rehab options.  I will fax information to the adjuster and request inpatient rehab admission.  I will have my partner follow up tomorrow.  Call for questions.  [phone]

## 2021-07-15 NOTE — Progress Notes (Signed)
Physical Therapy Treatment Patient Details Name: Craig Church MRN: [id number] DOB: [birth year] Today's Date: 07/15/2021   History of Present Illness 55 yo male with onset of fall at work off a ladder was brought to hosp on 9/23 and received ORIF for R distal radial fracture on 9/26, mildly displaced R ulnar styloid fracture not requiring sx.  Received TLSO for spinal injuries of T12 comp fx, T11 anterior wedging, B inferior articular process and spinous process fractures.  PMHx:  HA, colloid cyst of brain, idiopathic intracranial hypertension, LE edema, lumbar spondylosis and radiculopathy, chestpain, HLD    PT Comments    Pt with much improved mobility today with use of R platform RW, ambulating 300+ feet in hallway. Pt also demonstrated ability to enter home by practicing x4 steps. PT cuing pt for form and safety with RUE and back precautions during mobility, but again this is improving. PT updated recommendation to reflect home with support of wife and HHPT. PT to continue to follow.     Recommendations for follow up therapy are one component of a multi-disciplinary discharge planning process, led by the attending physician.  Recommendations may be updated based on patient status, additional functional criteria and insurance authorization.  Follow Up Recommendations  Home health PT;Supervision for mobility/OOB     Equipment Recommendations  Rolling walker with 5" wheels;Other (comment) (R platform RW)    Recommendations for Other Services       Precautions / Restrictions Precautions Precautions: Fall;Back Precaution Booklet Issued: No Precaution Comments: pt recalled 2/3 back precautions with min cuing Required Braces or Orthoses: Splint/Cast;Spinal Brace Spinal Brace: Thoracolumbosacral orthotic;Applied in sitting position Splint/Cast: to R UE Restrictions Weight Bearing Restrictions: Yes RUE Weight Bearing: Weight bear through elbow only Other Position/Activity Restrictions:  verbal order from Scheeler, Kermit Balo, PA-C: weightbearing through elbow only; NWB wrist     Mobility  Bed Mobility Overal bed mobility: Needs Assistance Bed Mobility: Rolling;Sidelying to Sit Rolling: Min guard Sidelying to sit: Min assist       General bed mobility comments: light assist for trunk elevation, cues for log roll.    Transfers Overall transfer level: Needs assistance Equipment used: Right platform walker Transfers: Sit to/from Stand Sit to Stand: Min guard         General transfer comment: for safety, cues for safe hand placement when rising/sitting.  Ambulation/Gait Ambulation/Gait assistance: Supervision Gait Distance (Feet): 300 Feet Assistive device: Right platform walker Gait Pattern/deviations: Step-through pattern;Decreased stride length;Trunk flexed Gait velocity: decr   General Gait Details: cues for upright posture, proper placement RUE to WB only through elbow   Stairs Stairs: Yes Stairs assistance: Supervision Stair Management: One rail Left;Alternating pattern;Forwards Number of Stairs: 4 General stair comments: supervision for safety, pt performed slowly and cautiously   Wheelchair Mobility    Modified Rankin (Stroke Patients Only)       Balance Overall balance assessment: Needs assistance;History of Falls Sitting-balance support: Feet supported Sitting balance-Leahy Scale: Good     Standing balance support: No upper extremity supported Standing balance-Leahy Scale: Fair Standing balance comment: can ambulate short distance without AD                            Cognition Arousal/Alertness: Awake/alert Behavior During Therapy: WFL for tasks assessed/performed Overall Cognitive Status: Within Functional Limits for tasks assessed  Exercises      General Comments        Pertinent Vitals/Pain Pain Assessment: No/denies pain Faces Pain Scale: No  hurt Pain Intervention(s): Monitored during session    Home Living                      Prior Function            PT Goals (current goals can now be found in the care plan section) Acute Rehab PT Goals Patient Stated Goal: to walk more independently and get home PT Goal Formulation: With patient Time For Goal Achievement: 07/27/21 Potential to Achieve Goals: Good Progress towards PT goals: Progressing toward goals    Frequency    Min 4X/week      PT Plan Current plan remains appropriate    Co-evaluation              AM-PAC PT "6 Clicks" Mobility   Outcome Measure  Help needed turning from your back to your side while in a flat bed without using bedrails?: A Little Help needed moving from lying on your back to sitting on the side of a flat bed without using bedrails?: A Little Help needed moving to and from a bed to a chair (including a wheelchair)?: A Little Help needed standing up from a chair using your arms (e.g., wheelchair or bedside chair)?: A Little Help needed to walk in hospital room?: A Little Help needed climbing 3-5 steps with a railing? : A Little 6 Click Score: 18    End of Session Equipment Utilized During Treatment: Back brace Activity Tolerance: Patient tolerated treatment well Patient left: in chair;with call bell/phone within reach;with chair alarm set Nurse Communication: Mobility status PT Visit Diagnosis: Unsteadiness on feet (R26.81);Muscle weakness (generalized) (M62.81);History of falling (Z91.81)     Time: 0865-7846 PT Time Calculation (min) (ACUTE ONLY): 24 min  Charges:  $Gait Training: 23-37 mins                     Marye Round, PT DPT Acute Rehabilitation Services Pager [phone]  Office [phone]   Tyrone Apple E Christain [place] 07/15/2021, 5:29 PM

## 2021-07-15 NOTE — Discharge Instructions (Addendum)
Please remain non-weight bearing to the right wrist  Continue aspen lumbar brace with thoracic extension when out of bed. You may take on and off when sitting up

## 2021-07-15 NOTE — Plan of Care (Signed)

## 2021-07-15 NOTE — Progress Notes (Signed)
3 Days Post-Op  Subjective: CC: Doing well.  Pain in wrist is well controlled on current oral medications.  Stable back soreness.  No other areas of pain.  Tolerating diet without nausea or vomiting.  Voiding.  BM yesterday.  Working well with therapies.  Agreeable to CIR.  Objective: Vital signs in last 24 hours: Temp:  [97.6 F (36.4 C)-98.3 F (36.8 C)] 97.6 F (36.4 C) (09/29 0740) Pulse Rate:  [62-65] 62 (09/29 0740) Resp:  [15-17] 17 (09/29 0740) BP: (119-120)/(69) 119/69 (09/29 0740) SpO2:  [94 %-96 %] 94 % (09/29 0740) Last BM Date: 07/15/21  Intake/Output from previous day: 09/28 0701 - 09/29 0700 In: 360 [P.O.:360] Out: -  Intake/Output this shift: No intake/output data recorded.  PE: Gen:  Alert, NAD HEENT: EOM's intact, pupils equal and round Card:  RRR, no M/G/R heard, palpable pedal pulses Pulm:  CTAB, no W/R/R, rate and effort normal Abd: Soft, NT/ND, +BS Ext:  calves soft and nontender. Splint to RUE, able to wiggle fingers, good cap refill Psych: A&Ox3 Neuro: No gross motor or sensory deficits BUE/BLE Skin: no rashes noted, warm and dry  Lab Results:  No results for input(s): WBC, HGB, HCT, PLT in the last 72 hours. BMET No results for input(s): NA, K, CL, CO2, GLUCOSE, BUN, CREATININE, CALCIUM in the last 72 hours. PT/INR No results for input(s): LABPROT, INR in the last 72 hours. CMP     Component Value Date/Time   NA 132 (L) 07/12/2021 0921   K 4.1 07/12/2021 0921   CL 99 07/12/2021 0921   CO2 23 07/12/2021 0921   GLUCOSE 93 07/12/2021 0921   BUN 18 07/12/2021 0921   CREATININE 1.09 07/12/2021 0921   CALCIUM 8.7 (L) 07/12/2021 0921   GFRNONAA >60 07/12/2021 0921   Lipase  No results found for: LIPASE  Studies/Results: DG Thoracic Spine 4V  Result Date: 07/14/2021 CLINICAL DATA:  Fracture of the thoracic spine. EXAM: THORACIC SPINE - 4+ VIEW COMPARISON:  07/09/2021 FINDINGS: Stable T12 vertebral body compression fracture with  approximately 20% height loss. Remainder of the vertebral body heights are maintained. Partially visualized nondisplaced fracture of the T11 spinous process. No static listhesis. Degenerative disease with disc height loss at T10-11 and T11-12. Visualized portions of the lungs are clear. IMPRESSION: Stable T12 vertebral body compression fracture with approximately 20% height loss. Partially visualized nondisplaced fracture of the T11 spinous process. Electronically Signed   By: Elige Ko M.D.   On: 07/14/2021 13:39    Anti-infectives: Anti-infectives (From admission, onward)    Start     Dose/Rate Route Frequency Ordered Stop   07/12/21 0900  clindamycin (CLEOCIN) IVPB 900 mg        900 mg 100 mL/hr over 30 Minutes Intravenous On call to O.R. 07/12/21 0810 07/12/21 1516        Assessment/Plan Fall from ladder Mediastinal hematoma - EKG NSR Right distal radius fracture/ mildly displaced ulnar styloid fracture - s/p ORIF w/ Dr. Arita Miss 9/26. PT/OT. Splint to remain in place for 1 week, patient can then have a splint made by OT if discharged prior to 1 week. I have clarified with Dr. Thomos Lemons team that patient is WBAT through elbow and NWB from elbow down  T12 compression fracture/ T11 anterior wedging, T11 bilateral inferior articular process, and T11 spinous process fx - per Dr. Franky Macho, Recommend aspen lumbar brace with thoracic extension when out of bed, may don doff when sitting up. Okay to be up Seizure  d/o HTN HLD OSA  8 mm LLL lung nodule- future CT rec 6-12 months.  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 Bowel regimen VTE - lovenox and ASA Foley - none Plan: Cont therapies. Medically stable for d/c to CIR.     LOS: 6 days    Jacinto Halim , [facility] 07/15/2021, 9:30 AM Please see Amion for pager number during day hours 7:00am-4:30pm

## 2021-07-16 LAB — BASIC METABOLIC PANEL
Anion gap: 11 (ref 5–15)
BUN: 27 mg/dL — ABNORMAL HIGH (ref 6–20)
CO2: 20 mmol/L — ABNORMAL LOW (ref 22–32)
Calcium: 9.2 mg/dL (ref 8.9–10.3)
Chloride: 106 mmol/L (ref 98–111)
Creatinine, Ser: 0.96 mg/dL (ref 0.61–1.24)
GFR, Estimated: >60 mL/min (ref >60)
Glucose, Bld: 96 mg/dL (ref 70–99)
Potassium: 3.4 mmol/L — ABNORMAL LOW (ref 3.5–5.1)
Sodium: 137 mmol/L (ref 135–145)

## 2021-07-16 MED ORDER — BISACODYL 10 MG RE SUPP: 10.0000 mg | Freq: Every day | RECTAL | Status: DC | PRN

## 2021-07-16 MED ORDER — POTASSIUM CHLORIDE CRYS ER 20 MEQ PO TBCR
40.0000 meq | EXTENDED_RELEASE_TABLET | Freq: Two times a day (BID) | ORAL | Status: AC
  Administered 2021-07-16 (×2): 40 meq via ORAL
  Filled 2021-07-16 (×2): qty 2

## 2021-07-16 NOTE — TOC Initial Note (Signed)
Transition of Care [facility]) - Initial/Assessment Note    Patient Details  Name: Craig Church MRN: [id number] Date of Birth: [birth year]  [facility] of [facility]) CM/SW Contact:    Glennon Mac, RN Phone Number: 07/16/2021, 1:11 PM  Clinical Narrative:                 55 yo male with onset of fall at work off a ladder was brought to hosp on 9/23 and received ORIF for R distal radial fracture on 9/26, mildly displaced R ulnar styloid fracture not requiring sx.  Received TLSO for spinal injuries of T12 comp fx, T11 anterior wedging, B inferior articular process and spinous process fractures. Prior to admission, patient independent and living at home with spouse, who can provide assistance at discharge.  PT/OT recommending home health follow-up, and patient agreeable to services.  Patient's injury is work-related and a claims adjuster has been assigned to patient.  Spoke with adjuster Flora Lipps, phone number [phone], fax #[phone]: He is following case and will arrange all discharge services.  Faxed H&P, home health and DME orders to Mr. Alois Cliche; he states that he will follow-up with the patient directly regarding delivery of equipment and arrangement of home health services.  Patient appreciative of assistance and he does have Worker's Comp. adjuster's contact information.  Expected Discharge Plan: Home w Home Health Services Barriers to Discharge: Continued Medical Work up   Patient Goals and CMS Choice Patient states their goals for this hospitalization and ongoing recovery are:: to go home CMS Medicare.gov Compare Post Acute Care list provided to:: Patient Choice offered to / list presented to : Patient  Expected Discharge Plan and Services Expected Discharge Plan: Home w Home Health Services   Discharge Planning Services: CM Consult Post Acute Care Choice: Home Health Living arrangements for the past 2 months: Single Family Home                 DME Arranged: 3-N-1, Walker  platform   Date DME Agency Contacted: 07/16/21 Time DME Agency Contacted: 1000 Representative spoke with at DME Agency: Flora Lipps, adjuster with [facility] HH Arranged: PT, OT HH Agency: Other - See comment Date HH Agency Contacted: 07/16/21 Time HH Agency Contacted: 1000 Representative spoke with at [facility] Agency: Flora Lipps adjuster with [facility]  Prior Living Arrangements/Services Living arrangements for the past 2 months: Single Family Home Lives with:: Spouse Patient language and need for interpreter reviewed:: Yes Do you feel safe going back to the place where you live?: Yes      Need for Family Participation in Patient Care: Yes (Comment) Care giver support system in place?: Yes (comment)   Criminal Activity/Legal Involvement Pertinent to Current Situation/Hospitalization: No - Comment as needed  Activities of Daily Living Home Assistive Devices/Equipment: None, CPAP ADL Screening (condition at time of admission) Patient's cognitive ability adequate to safely complete daily activities?: Yes Is the patient deaf or have difficulty hearing?: No Does the patient have difficulty seeing, even when wearing glasses/contacts?: No Does the patient have difficulty concentrating, remembering, or making decisions?: No Patient able to express need for assistance with ADLs?: Yes Does the patient have difficulty dressing or bathing?: Yes Independently performs ADLs?: No Communication: Independent Dressing (OT): Needs assistance Grooming: Needs assistance Feeding: Independent Bathing: Needs assistance Toileting: Needs assistance In/Out Bed: Needs assistance Walks in Home: Independent Does the patient have difficulty walking or climbing stairs?: Yes Weakness of Legs: None Weakness of Arms/Hands: Right  Permission Sought/Granted  Emotional Assessment Appearance:: Appears stated age Attitude/Demeanor/Rapport: Engaged, Gracious Affect (typically observed):  Accepting Orientation: : Oriented to Self, Oriented to Place, Oriented to  Time, Oriented to Situation      Admission diagnosis:  Back pain [M54.9] Trauma [T14.90XA] Fall [W19.XXXA] Mediastinal hematoma, initial encounter [S27.892A] Closed wedge compression fracture of T11 vertebra, initial encounter (HCC) [S22.080A] Closed fracture of right wrist, initial encounter [S62.101A] Patient Active Problem List   Diagnosis Date Noted   Fall 07/09/2021   PCP:  April Manson, NP Pharmacy:   [facility] [phone] - [place], [place] - [address] [place] [address] Phone: [phone] Fax: [phone]     Social Determinants of Health (SDOH) Interventions    Readmission Risk Interventions No flowsheet data found.  Quintella Baton, RN, BSN  Trauma/Neuro ICU Case Manager [phone]

## 2021-07-16 NOTE — Plan of Care (Signed)

## 2021-07-16 NOTE — Progress Notes (Signed)
4 Days Post-Op  Subjective: 55 year old male status post open reduction internal fixation of his right distal radius fracture with Dr. Arita Miss on 07/12/2021.  He reports overall he is doing well in regards to this, has some pain.  Objective: Vital signs in last 24 hours: Temp:  [98.4 F (36.9 C)-98.5 F (36.9 C)] 98.4 F (36.9 C) (09/30 0741) Pulse Rate:  [59-66] 62 (09/30 0741) Resp:  [16] 16 (09/30 0741) BP: (105-109)/(59-68) 109/59 (09/30 0741) SpO2:  [95 %-97 %] 96 % (09/30 0741) Last BM Date: 07/15/21  Intake/Output from previous day: 09/29 0701 - 09/30 0700 In: 960 [P.O.:960] Out: -  Intake/Output this shift: Total I/O In: 120 [P.O.:120] Out: -   General appearance: alert, cooperative, no distress, and sitting up in bedside chair Chest wall: Back brace in place Extremities: Right wrist splint and dressing in place, normal capillary refill of right digits noted.  Swelling of digits noted as expected.  Lab Results:  CBC Latest Ref Rng & Units 07/12/2021 07/10/2021 07/10/2021  WBC 4.0 - 10.5 K/uL 7.3 8.2 8.0  Hemoglobin 13.0 - 17.0 g/dL 11.8(L) 11.7(L) 11.2(L)  Hematocrit 39.0 - 52.0 % 36.0(L) 35.8(L) 35.7(L)  Platelets 150 - 400 K/uL 265 277 261    BMET Recent Labs    07/16/21 0439  NA 137  K 3.4*  CL 106  CO2 20*  GLUCOSE 96  BUN 27*  CREATININE 0.96  CALCIUM 9.2   PT/INR No results for input(s): LABPROT, INR in the last 72 hours. ABG No results for input(s): PHART, HCO3 in the last 72 hours.  Invalid input(s): PCO2, PO2  Studies/Results: No results found.  Anti-infectives: Anti-infectives (From admission, onward)    Start     Dose/Rate Route Frequency Ordered Stop   07/12/21 0900  clindamycin (CLEOCIN) IVPB 900 mg        900 mg 100 mL/hr over 30 Minutes Intravenous On call to O.R. 07/12/21 0810 07/12/21 1516       Assessment/Plan: s/p Procedure(s): OPEN REDUCTION INTERNAL FIXATION (ORIF) DISTAL RADIUS  Patient is stable for discharge in regards  to distal radius.  Will need OT set up as an outpatient for placement of volar wrist splint and ongoing therapy.  Discussed plan with patient and expected follow-up in our clinic in approximately 1 week.  Discussed with patient he will need x-ray prior to appointment.  X-ray has been ordered.  Discussed with patient to call our office with any questions or concerns.  All of his questions today were answered to his content.   LOS: 7 days    Leslee Home, PA-C 07/16/2021

## 2021-07-16 NOTE — Progress Notes (Signed)
Patient ID: Craig Church, male   DOB: [birth year], 55 y.o.   MRN: [id number] [facility] Surgery Progress Note  4 Days Post-Op  Subjective: CC-  Having some pain in the right wrist this morning. Did well with therapies yesterday, now recommending home with home health. He would prefer this over CIR.  Objective: Vital signs in last 24 hours: Temp:  [98.4 F (36.9 C)-98.5 F (36.9 C)] 98.4 F (36.9 C) (09/30 0741) Pulse Rate:  [59-66] 62 (09/30 0741) Resp:  [16] 16 (09/30 0741) BP: (105-109)/(59-68) 109/59 (09/30 0741) SpO2:  [95 %-97 %] 96 % (09/30 0741) Last BM Date: 07/15/21  Intake/Output from previous day: 09/29 0701 - 09/30 0700 In: 960 [P.O.:960] Out: -  Intake/Output this shift: No intake/output data recorded.  PE: Gen:  Alert, NAD HEENT: EOM's intact, pupils equal and round Card:  RRR, no M/G/R heard, palpable pedal pulses Pulm:  CTAB, no W/R/R, rate and effort normal Abd: Soft, NT/ND, +BS Ext:  calves soft and nontender. Splint to RUE, able to wiggle fingers, good cap refill Psych: A&Ox3 Neuro: No gross motor or sensory deficits BUE/BLE Skin: no rashes noted, warm and dry  Lab Results:  No results for input(s): WBC, HGB, HCT, PLT in the last 72 hours. BMET Recent Labs    07/16/21 0439  NA 137  K 3.4*  CL 106  CO2 20*  GLUCOSE 96  BUN 27*  CREATININE 0.96  CALCIUM 9.2   PT/INR No results for input(s): LABPROT, INR in the last 72 hours. CMP     Component Value Date/Time   NA 137 07/16/2021 0439   K 3.4 (L) 07/16/2021 0439   CL 106 07/16/2021 0439   CO2 20 (L) 07/16/2021 0439   GLUCOSE 96 07/16/2021 0439   BUN 27 (H) 07/16/2021 0439   CREATININE 0.96 07/16/2021 0439   CALCIUM 9.2 07/16/2021 0439   GFRNONAA >60 07/16/2021 0439   Lipase  No results found for: LIPASE     Studies/Results: DG Thoracic Spine 4V  Result Date: 07/14/2021 CLINICAL DATA:  Fracture of the thoracic spine. EXAM: THORACIC SPINE - 4+ VIEW COMPARISON:  07/09/2021  FINDINGS: Stable T12 vertebral body compression fracture with approximately 20% height loss. Remainder of the vertebral body heights are maintained. Partially visualized nondisplaced fracture of the T11 spinous process. No static listhesis. Degenerative disease with disc height loss at T10-11 and T11-12. Visualized portions of the lungs are clear. IMPRESSION: Stable T12 vertebral body compression fracture with approximately 20% height loss. Partially visualized nondisplaced fracture of the T11 spinous process. Electronically Signed   By: Elige Ko M.D.   On: 07/14/2021 13:39    Anti-infectives: Anti-infectives (From admission, onward)    Start     Dose/Rate Route Frequency Ordered Stop   07/12/21 0900  clindamycin (CLEOCIN) IVPB 900 mg        900 mg 100 mL/hr over 30 Minutes Intravenous On call to O.R. 07/12/21 0810 07/12/21 1516        Assessment/Plan Fall from ladder Mediastinal hematoma - EKG NSR Right distal radius fracture/ mildly displaced ulnar styloid fracture - s/p ORIF w/ Dr. Arita Miss 9/26. PT/OT. Splint to remain in place for 1 week, patient can then have a splint made by OT if discharged prior to 1 week. I have clarified with Dr. Thomos Lemons team that patient is WBAT through elbow and NWB from elbow down  T12 compression fracture/ T11 anterior wedging, T11 bilateral inferior articular process, and T11 spinous process fx - per Dr. Franky Macho,  Recommend aspen lumbar brace with thoracic extension when out of bed, may don doff when sitting up. Okay to be up Seizure d/o HTN HLD OSA  8 mm LLL lung nodule- future CT rec 6-12 months.  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 Bowel regimen VTE - lovenox and ASA Foley - none Plan: Cont therapies. Home health PT/OT and DME orders placed. May be ready for discharge over the weekend.    LOS: 7 days    Franne Forts, [facility]  Surgery 07/16/2021, 9:06 AM Please see Amion for pager number during day hours 7:00am-4:30pm

## 2021-07-16 NOTE — Progress Notes (Signed)
Inpatient Rehab Admissions Coordinator:    Pt. Now ambulating at supervision level and PT is recommending HH. I will not pursue for CIR admission.  Megan Salon, MS, CCC-SLP Rehab Admissions Coordinator  [phone] (celll) [phone] (office)

## 2021-07-16 NOTE — Progress Notes (Signed)
Physical Therapy Treatment Patient Details Name: Craig Church MRN: [id number] DOB: Apr 14, 1966 Today's Date: 07/16/2021   History of Present Illness 55 yo male with onset of fall at work off a ladder was brought to hosp on 9/23 and received ORIF for R distal radial fracture on 9/26, mildly displaced R ulnar styloid fracture not requiring sx.  Received TLSO for spinal injuries of T12 comp fx, T11 anterior wedging, B inferior articular process and spinous process fractures.  PMHx:  HA, colloid cyst of brain, idiopathic intracranial hypertension, LE edema, lumbar spondylosis and radiculopathy, chestpain, HLD    PT Comments    Pt was seen for adjustment of platform attachment to stabilize, and added extenders to his brace.  Pt demonstrated tightening brace with cinch strap, and reported increased comfort in the back. Pt is able to stand and walk on hall, steps, with less assistance, demonstrating more awareness of safety and posture.  His progress is encouraging and more illustrative of a person who is prepared to transition care home.  Follow along with him to complete therapy preparations for home as planned.   Recommendations for follow up therapy are one component of a multi-disciplinary discharge planning process, led by the attending physician.  Recommendations may be updated based on patient status, additional functional criteria and insurance authorization.  Follow Up Recommendations  Home health PT;Supervision for mobility/OOB     Equipment Recommendations  Rolling walker with 5" wheels;Other (comment) (R platform, shower chair,)    Recommendations for Other Services       Precautions / Restrictions Precautions Precautions: Fall;Back Precaution Booklet Issued: No Precaution Comments: cues during session Required Braces or Orthoses: Splint/Cast;Spinal Brace Spinal Brace: Thoracolumbosacral orthotic;Applied in sitting position (added extenders) Splint/Cast: to R UE Restrictions Weight  Bearing Restrictions: Yes RUE Weight Bearing: Weight bear through elbow only     Mobility  Bed Mobility Overal bed mobility: Needs Assistance             General bed mobility comments: up in chair when PT arrived    Transfers Overall transfer level: Needs assistance Equipment used: Right platform walker Transfers: Sit to/from Stand Sit to Stand: Min guard         General transfer comment: cues for LUE only to push off to stand  Ambulation/Gait Ambulation/Gait assistance: Min guard Gait Distance (Feet): 300 Feet Assistive device: Right platform walker Gait Pattern/deviations: Step-through pattern;Decreased stride length;Trunk flexed Gait velocity: redcued Gait velocity interpretation: <1.31 ft/sec, indicative of household ambulator General Gait Details: reminders for upright posture   Stairs Stairs: Yes Stairs assistance: Min guard Stair Management: One rail Left;Alternating pattern;Forwards Number of Stairs: 4 General stair comments: done slowly with reminders for sequence   Wheelchair Mobility    Modified Rankin (Stroke Patients Only)       Balance Overall balance assessment: Needs assistance;History of Falls Sitting-balance support: Feet supported Sitting balance-Leahy Scale: Good     Standing balance support: Single extremity supported;No upper extremity supported Standing balance-Leahy Scale: Fair Standing balance comment: walked with RW and R elbow support and noted platform is a bit loose, tightened it                            Cognition Arousal/Alertness: Awake/alert Behavior During Therapy: WFL for tasks assessed/performed Overall Cognitive Status: Within Functional Limits for tasks assessed  Exercises      General Comments General comments (skin integrity, edema, etc.): pt and PT discussed home equipment needs      Pertinent Vitals/Pain Pain Assessment: Faces Faces  Pain Scale: Hurts a little bit Pain Location: R wrist and back Pain Descriptors / Indicators: Guarding Pain Intervention(s): Limited activity within patient's tolerance;Monitored during session;Premedicated before session;Repositioned    Home Living                      Prior Function            PT Goals (current goals can now be found in the care plan section) Acute Rehab PT Goals Patient Stated Goal: get home soon Progress towards PT goals: Progressing toward goals    Frequency    Min 4X/week      PT Plan Current plan remains appropriate    Co-evaluation              AM-PAC PT "6 Clicks" Mobility   Outcome Measure  Help needed turning from your back to your side while in a flat bed without using bedrails?: A Little Help needed moving from lying on your back to sitting on the side of a flat bed without using bedrails?: A Little Help needed moving to and from a bed to a chair (including a wheelchair)?: A Little Help needed standing up from a chair using your arms (e.g., wheelchair or bedside chair)?: A Little Help needed to walk in hospital room?: A Little Help needed climbing 3-5 steps with a railing? : A Little 6 Click Score: 18    End of Session Equipment Utilized During Treatment: Gait belt;Back brace;Other (comment) (R wrist splint) Activity Tolerance: Patient tolerated treatment well Patient left: in chair;with call bell/phone within reach;with chair alarm set Nurse Communication: Mobility status PT Visit Diagnosis: Unsteadiness on feet (R26.81);Muscle weakness (generalized) (M62.81);History of falling (Z91.81) Pain - Right/Left: Right Pain - part of body: Arm     Time: 6629-4765 PT Time Calculation (min) (ACUTE ONLY): 32 min  Charges:  $Gait Training: 8-22 mins $Therapeutic Activity: 8-22 mins                  Ivar Drape 07/16/2021, 6:22 PM  Samul Dada, PT MS Acute Rehab Dept. Number: [facility] [id number] and [facility] [phone]

## 2021-07-16 NOTE — Progress Notes (Signed)
Occupational Therapy Treatment Patient Details Name: Craig Church MRN: [id number] DOB: May 12, 1966 Today's Date: 07/16/2021   History of present illness 55 yo male with onset of fall at work off a ladder was brought to hosp on 9/23 and received ORIF for R distal radial fracture on 9/26, mildly displaced R ulnar styloid fracture not requiring sx.  Received TLSO for spinal injuries of T12 comp fx, T11 anterior wedging, B inferior articular process and spinous process fractures.  PMHx:  HA, colloid cyst of brain, idiopathic intracranial hypertension, LE edema, lumbar spondylosis and radiculopathy, chestpain, HLD   OT comments  Pt presented in chair at this time . Pt educated on how to adjust their brace in session with hand over hand to be able to follow precautions. Pt then needed cues on how to follow precautions in session but as continued was able to voice and complete with tasks. Pt completed transfers with in guard to supervision, standing tasks without UE support and no LOB for less then 1 min intervals and compensations on how to complete due to decrease in FM in R hand.Pt at this time would rather go home over CIR stay and was educated on how to complete tasks following MD orders.  Pt currently with functional limitations due to the deficits listed below (see OT Problem List).  Pt will benefit from skilled OT to increase their safety and independence with ADL and functional mobility for ADL to facilitate discharge to venue listed below.     Recommendations for follow up therapy are one component of a multi-disciplinary discharge planning process, led by the attending physician.  Recommendations may be updated based on patient status, additional functional criteria and insurance authorization.    Follow Up Recommendations  Home health OT;Supervision/Assistance - 24 hour    Equipment Recommendations  3 in 1 bedside commode;Tub/shower bench;Other (comment) (RW with platform)    Recommendations for  Other Services      Precautions / Restrictions Precautions Precautions: Fall;Back Precaution Booklet Issued: No Precaution Comments: Pt requires cues on back precations then in session was able to recall Required Braces or Orthoses: Splint/Cast;Spinal Brace Spinal Brace: Thoracolumbosacral orthotic;Applied in sitting position Splint/Cast: to R UE Restrictions Weight Bearing Restrictions: Yes RUE Weight Bearing: Non weight bearing Other Position/Activity Restrictions: verbal order from Scheeler, Kermit Balo, PA-C: weightbearing through elbow only; NWB wrist       Mobility Bed Mobility               General bed mobility comments: presented in chair    Transfers Overall transfer level: Needs assistance Equipment used: Right platform walker Transfers: Sit to/from Stand Sit to Stand: Supervision              Balance Overall balance assessment: Needs assistance;History of Falls Sitting-balance support: Feet supported Sitting balance-Leahy Scale: Good     Standing balance support: Single extremity supported;No upper extremity supported Standing balance-Leahy Scale: Fair                             ADL either performed or assessed with clinical judgement   ADL Overall ADL's : Needs assistance/impaired Eating/Feeding: Set up;Cueing for safety;Cueing for sequencing;Sitting   Grooming: Minimal assistance;Cueing for sequencing;Cueing for safety;Sitting   Upper Body Bathing: Moderate assistance;Cueing for safety;Cueing for sequencing;Sitting   Lower Body Bathing: Sit to/from stand;Moderate assistance;Cueing for safety;Cueing for sequencing   Upper Body Dressing : Moderate assistance;Cueing for safety;Cueing for sequencing;Sitting   Lower Body  Dressing: Moderate assistance;Cueing for safety;Cueing for sequencing;Sit to/from stand   Toilet Transfer: Minimal assistance;Cueing for safety;Cueing for sequencing   Toileting- Clothing Manipulation and Hygiene:  Moderate assistance;Cueing for safety;Cueing for sequencing;Sit to/from stand       Functional mobility during ADLs: Min guard;Cueing for safety;Cueing for sequencing;Rolling walker       Vision   Vision Assessment?: No apparent visual deficits   Perception     Praxis      Cognition Arousal/Alertness: Awake/alert Behavior During Therapy: WFL for tasks assessed/performed Overall Cognitive Status: Within Functional Limits for tasks assessed                                          Exercises     Shoulder Instructions       General Comments      Pertinent Vitals/ Pain       Pain Assessment: Faces Faces Pain Scale: Hurts a little bit Pain Location: R wrist and back Pain Descriptors / Indicators: Grimacing;Guarding Pain Intervention(s): Limited activity within patient's tolerance  Home Living                                          Prior Functioning/Environment              Frequency  Min 2X/week        Progress Toward Goals  OT Goals(current goals can now be found in the care plan section)  Progress towards OT goals: Progressing toward goals  Acute Rehab OT Goals Patient Stated Goal: to go home in the today or tomorrow OT Goal Formulation: With patient Time For Goal Achievement: 07/29/21 Potential to Achieve Goals: Good ADL Goals Pt Will Perform Grooming: standing;with modified independence Pt Will Perform Lower Body Dressing: with modified independence;with adaptive equipment;sit to/from stand Pt Will Transfer to Toilet: with modified independence;ambulating;bedside commode Pt Will Perform Toileting - Clothing Manipulation and hygiene: with modified independence;sit to/from stand Additional ADL Goal #1: Pt will manage back brace with modified independence.  Plan Discharge plan needs to be updated    Co-evaluation                 AM-PAC OT "6 Clicks" Daily Activity     Outcome Measure   Help from  another person eating meals?: A Little Help from another person taking care of personal grooming?: A Little Help from another person toileting, which includes using toliet, bedpan, or urinal?: A Lot Help from another person bathing (including washing, rinsing, drying)?: A Lot Help from another person to put on and taking off regular upper body clothing?: A Lot Help from another person to put on and taking off regular lower body clothing?: A Lot 6 Click Score: 14    End of Session Equipment Utilized During Treatment: Gait belt;Back brace  OT Visit Diagnosis: Other abnormalities of gait and mobility (R26.89);Muscle weakness (generalized) (M62.81);Pain Pain - Right/Left: Right Pain - part of body: Hand;Arm   Activity Tolerance Patient tolerated treatment well   Patient Left in chair;with call bell/phone within reach   Nurse Communication          Time: 4081-4481 OT Time Calculation (min): 30 min  Charges: OT General Charges $OT Visit: 1 Visit OT Treatments $Self Care/Home Management : 23-37 mins  Alphia Moh OTR/L  Acute Rehab Services  [phone] office number [phone] pager number   Alphia Moh 07/16/2021, 11:29 AM

## 2021-07-17 MED ORDER — POLYETHYLENE GLYCOL 3350 17 G PO PACK: 17.0000 g | PACK | Freq: Every day | ORAL | 0 refills | Status: DC | PRN

## 2021-07-17 MED ORDER — METHOCARBAMOL 500 MG PO TABS: 1000.0000 mg | ORAL_TABLET | Freq: Three times a day (TID) | ORAL | 0 refills | Status: DC | PRN

## 2021-07-17 MED ORDER — ACETAMINOPHEN 500 MG PO TABS: 1000.0000 mg | ORAL_TABLET | Freq: Four times a day (QID) | ORAL | 0 refills | Status: DC | PRN

## 2021-07-17 MED ORDER — OXYCODONE HCL 10 MG PO TABS: 5.0000 mg | ORAL_TABLET | Freq: Four times a day (QID) | ORAL | 0 refills | Status: DC | PRN

## 2021-07-17 NOTE — Progress Notes (Signed)
Patient ID: Craig Church, male   DOB: 1966/10/16, 55 y.o.   MRN: [id number] Patient has not heard from workers comp regarding equipment delivery. Case management tried to reach out to them today but were unable to reach them. He is medically stable for discharge but will not have the equipment he needs to safely mobilize at home. Cannot discharge today unless equipment is delivered.  Franne Forts, PA-C [facility] Surgery 07/17/2021, 12:17 PM Please see Amion for pager number during day hours 7:00am-4:30pm

## 2021-07-17 NOTE — Progress Notes (Signed)
Physical Therapy Treatment Patient Details Name: Arliss Frisina MRN: [id number] DOB: Feb 19, 1966 Today's Date: 07/17/2021   History of Present Illness 55 yo male with onset of fall at work off a ladder was brought to hosp on 9/23 and received ORIF for R distal radial fracture on 9/26, mildly displaced R ulnar styloid fracture not requiring sx.  Received TLSO for spinal injuries of T12 comp fx, T11 anterior wedging, B inferior articular process and spinous process fractures.  PMHx:  HA, colloid cyst of brain, idiopathic intracranial hypertension, LE edema, lumbar spondylosis and radiculopathy, chestpain, HLD    PT Comments    Spoke with PA about the pt's progress today, specifically his ability to stand and walk with no more than cues.  He is walking backward on the hall with supervision, and able to demonstrate starting and stopping on the walker.  Pt would be appropriate for the platform rollator, but may go home with platform walker due to availability of the device.   Follow him acutely for progressing gait and safety with all mobility, as well as education for wife regarding his care needs.   Recommendations for follow up therapy are one component of a multi-disciplinary discharge planning process, led by the attending physician.  Recommendations may be updated based on patient status, additional functional criteria and insurance authorization.  Follow Up Recommendations  Home health PT;Supervision for mobility/OOB     Equipment Recommendations  Other (comment) (Rollator with platform attachment)    Recommendations for Other Services       Precautions / Restrictions Precautions Precautions: Fall;Back Precaution Booklet Issued: No Precaution Comments: reminders minimally needed Required Braces or Orthoses: Splint/Cast;Spinal Brace Spinal Brace: Thoracolumbosacral orthotic;Applied in sitting position Splint/Cast: to R UE Restrictions Weight Bearing Restrictions: Yes RUE Weight Bearing:  Weight bear through elbow only Other Position/Activity Restrictions: verbal order from Scheeler, Kermit Balo, PA-C: weightbearing through elbow only; NWB wrist     Mobility  Bed Mobility               General bed mobility comments: up in chair when PT arrives    Transfers Overall transfer level: Needs assistance Equipment used: Right platform walker Transfers: Sit to/from Stand Sit to Stand: Supervision (cued L UE hand placement)         General transfer comment: pt is recalling sequence and can place RUE with vc's only on platform  Ambulation/Gait Ambulation/Gait assistance: Supervision Gait Distance (Feet): 300 Feet Assistive device: Right platform walker (Rollator platform walker) Gait Pattern/deviations: Step-through pattern;Decreased stride length;Narrow base of support Gait velocity: reduced   General Gait Details: pt is using features of walker to maintain better posture today   Stairs             Wheelchair Mobility    Modified Rankin (Stroke Patients Only)       Balance Overall balance assessment: Needs assistance;History of Falls Sitting-balance support: Feet supported Sitting balance-Leahy Scale: Good   Postural control: Posterior lean Standing balance support: Bilateral upper extremity supported;During functional activity Standing balance-Leahy Scale: Fair                              Cognition Arousal/Alertness: Awake/alert Behavior During Therapy: WFL for tasks assessed/performed Overall Cognitive Status: Within Functional Limits for tasks assessed  Exercises      General Comments General comments (skin integrity, edema, etc.): pt is demonstrating awareness of walker control and has good postural support with walker only      Pertinent Vitals/Pain Pain Assessment: No/denies pain    Home Living                      Prior Function            PT  Goals (current goals can now be found in the care plan section) Acute Rehab PT Goals Patient Stated Goal: get home soon Progress towards PT goals: Progressing toward goals    Frequency    Min 4X/week      PT Plan Current plan remains appropriate    Co-evaluation              AM-PAC PT "6 Clicks" Mobility   Outcome Measure  Help needed turning from your back to your side while in a flat bed without using bedrails?: A Little Help needed moving from lying on your back to sitting on the side of a flat bed without using bedrails?: A Little Help needed moving to and from a bed to a chair (including a wheelchair)?: A Little Help needed standing up from a chair using your arms (e.g., wheelchair or bedside chair)?: A Little Help needed to walk in hospital room?: A Little Help needed climbing 3-5 steps with a railing? : A Little 6 Click Score: 18    End of Session Equipment Utilized During Treatment: Gait belt;Back brace;Other (comment) (R wrist splint) Activity Tolerance: Patient tolerated treatment well Patient left: in chair;with call bell/phone within reach Nurse Communication: Mobility status PT Visit Diagnosis: Unsteadiness on feet (R26.81);History of falling (Z91.81) Pain - Right/Left: Right Pain - part of body: Arm     Time: 1131-1207 PT Time Calculation (min) (ACUTE ONLY): 36 min  Charges:  $Gait Training: 8-22 mins $Therapeutic Activity: 8-22 mins                Ivar Drape 07/17/2021, 12:14 PM  Samul Dada, PT MS Acute Rehab Dept. Number: [facility] [id number] and [facility] [phone]

## 2021-07-17 NOTE — Progress Notes (Signed)
   07/17/21 0957  AVS Discharge Documentation  AVS Discharge Instructions Including Medications Provided to patient/caregiver  Name of Person Receiving AVS Discharge Instructions Including Medications Jenel Lucks  Name of Clinician That Reviewed AVS Discharge Instructions Including Medications Luanna Salk RN

## 2021-07-17 NOTE — Progress Notes (Addendum)
Patient ID: Craig Church, male   DOB: 07-Mar-1966, 55 y.o.   MRN: [id number] [facility] Surgery Progress Note  5 Days Post-Op  Subjective: CC-  Up walking around room. No new complaints. Pain well controlled.  Objective: Vital signs in last 24 hours: Temp:  [97.6 F (36.4 C)-97.8 F (36.6 C)] 97.8 F (36.6 C) (10/01 0831) Pulse Rate:  [56-66] 56 (10/01 0831) Resp:  [14-19] 15 (10/01 0831) BP: (103-112)/(67-68) 110/68 (10/01 0831) SpO2:  [97 %-98 %] 97 % (10/01 0831) Last BM Date: 07/15/21  Intake/Output from previous day: 09/30 0701 - 10/01 0700 In: 480 [P.O.:480] Out: 500 [Urine:500] Intake/Output this shift: Total I/O In: 120 [P.O.:120] Out: 575 [Urine:575]  PE: Gen:  Alert, NAD HEENT: EOM's intact, pupils equal and round Card:  RRR, no M/G/R heard, palpable pedal pulses Pulm:  CTAB, no W/R/R, rate and effort normal Abd: Soft, NT/ND, +BS Ext:  calves soft and nontender. Splint to RUE, able to wiggle fingers, good cap refill Psych: A&Ox3 Neuro: No gross motor or sensory deficits BUE/BLE Skin: no rashes noted, warm and dry  Lab Results:  No results for input(s): WBC, HGB, HCT, PLT in the last 72 hours. BMET Recent Labs    07/16/21 0439  NA 137  K 3.4*  CL 106  CO2 20*  GLUCOSE 96  BUN 27*  CREATININE 0.96  CALCIUM 9.2   PT/INR No results for input(s): LABPROT, INR in the last 72 hours. CMP     Component Value Date/Time   NA 137 07/16/2021 0439   K 3.4 (L) 07/16/2021 0439   CL 106 07/16/2021 0439   CO2 20 (L) 07/16/2021 0439   GLUCOSE 96 07/16/2021 0439   BUN 27 (H) 07/16/2021 0439   CREATININE 0.96 07/16/2021 0439   CALCIUM 9.2 07/16/2021 0439   GFRNONAA >60 07/16/2021 0439   Lipase  No results found for: LIPASE     Studies/Results: No results found.  Anti-infectives: Anti-infectives (From admission, onward)    Start     Dose/Rate Route Frequency Ordered Stop   07/12/21 0900  clindamycin (CLEOCIN) IVPB 900 mg        900 mg 100  mL/hr over 30 Minutes Intravenous On call to O.R. 07/12/21 0810 07/12/21 1516        Assessment/Plan Fall from ladder Mediastinal hematoma - EKG NSR Right distal radius fracture/ mildly displaced ulnar styloid fracture - s/p ORIF w/ Dr. Arita Miss 9/26. PT/OT. Splint to remain in place for 1 week, patient can then have a splint made by OT if discharged prior to 1 week. I have clarified with Dr. Thomos Lemons team that patient is WBAT through elbow and NWB from elbow down  T12 compression fracture/ T11 anterior wedging, T11 bilateral inferior articular process, and T11 spinous process fx - per Dr. Franky Macho, Recommend aspen lumbar brace with thoracic extension when out of bed, may don doff when sitting up. Okay to be up Seizure d/o HTN HLD OSA  8 mm LLL lung nodule- future CT rec 6-12 months.  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 Bowel regimen VTE - lovenox and ASA Foley - none Plan: Cont therapies. Home health PT/OT and DME orders placed. I called and spoke with the patient's wife, she is concerned that he is not ready for discharge and she feels like she has not had enough time to get everything at the house  ready for him. We will see how therapy goes today, possible discharge later today vs tomorrow.   LOS: 8 days    Franne Forts, [facility] Surgery 07/17/2021, 10:02 AM Please see Amion for pager number during day hours 7:00am-4:30pm

## 2021-07-18 NOTE — Progress Notes (Signed)
Patient ID: Craig Church, male   DOB: [birth year], 55 y.o.   MRN: [id number] [facility] Surgery Progress Note  6 Days Post-Op  Subjective: CC-  Up walking around room. No new complaints. Pain well controlled.  Unable to have mobility equipment delivered to home yesterday  Objective: Vital signs in last 24 hours: Temp:  [97.7 F (36.5 C)-98 F (36.7 C)] 98 F (36.7 C) (10/02 1050) Pulse Rate:  [70-76] 76 (10/02 1050) Resp:  [18-19] 18 (10/02 1050) BP: (91-112)/(64-70) 108/64 (10/02 1050) SpO2:  [98 %-100 %] 98 % (10/02 1050) Last BM Date: 07/15/21  Intake/Output from previous day: 10/01 0701 - 10/02 0700 In: 240 [P.O.:240] Out: 1075 [Urine:1075] Intake/Output this shift: No intake/output data recorded.  PE: Gen:  Alert, NAD Abd: Soft, NT/N Ext:  calves soft and nontender. Splint to RUE, able to wiggle fingers, good cap refill Psych: A&Ox3 Neuro: No gross motor or sensory deficits BUE/BLE Skin: no rashes noted, warm and dry  Lab Results:  No results for input(s): WBC, HGB, HCT, PLT in the last 72 hours. BMET Recent Labs    07/16/21 0439  NA 137  K 3.4*  CL 106  CO2 20*  GLUCOSE 96  BUN 27*  CREATININE 0.96  CALCIUM 9.2    PT/INR No results for input(s): LABPROT, INR in the last 72 hours. CMP     Component Value Date/Time   NA 137 07/16/2021 0439   K 3.4 (L) 07/16/2021 0439   CL 106 07/16/2021 0439   CO2 20 (L) 07/16/2021 0439   GLUCOSE 96 07/16/2021 0439   BUN 27 (H) 07/16/2021 0439   CREATININE 0.96 07/16/2021 0439   CALCIUM 9.2 07/16/2021 0439   GFRNONAA >60 07/16/2021 0439   Lipase  No results found for: LIPASE     Studies/Results: No results found.  Anti-infectives: Anti-infectives (From admission, onward)    Start     Dose/Rate Route Frequency Ordered Stop   07/12/21 0900  clindamycin (CLEOCIN) IVPB 900 mg        900 mg 100 mL/hr over 30 Minutes Intravenous On call to O.R. 07/12/21 0810 07/12/21 1516         Assessment/Plan Fall from ladder Mediastinal hematoma - EKG NSR Right distal radius fracture/ mildly displaced ulnar styloid fracture - s/p ORIF w/ Dr. Arita Miss 9/26. PT/OT. Splint to remain in place for 1 week, patient can then have a splint made by OT if discharged prior to 1 week. I have clarified with Dr. Thomos Lemons team that patient is WBAT through elbow and NWB from elbow down  T12 compression fracture/ T11 anterior wedging, T11 bilateral inferior articular process, and T11 spinous process fx - per Dr. Franky Macho, Recommend aspen lumbar brace with thoracic extension when out of bed, may don doff when sitting up. Okay to be up Seizure d/o HTN HLD OSA  8 mm LLL lung nodule- future CT rec 6-12 months.  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 Bowel regimen VTE - lovenox and ASA Foley - none Plan: Cont therapies. Home health PT/OT and DME orders placed. We awaiting delivery of these items.  He is medically stable for discharge but will not have the equipment he needs to safely mobilize at home. Cannot discharge today unless equipment is delivered.  LOS: 9 days    Vanita Panda, MD  Colorectal and General Surgery [facility] Surgery

## 2021-07-18 NOTE — Progress Notes (Signed)
Physical Therapy Treatment Patient Details Name: Craig Church MRN: [id number] DOB: 05-Jun-1966 Today's Date: 07/18/2021   History of Present Illness 55 yo male with onset of fall at work off a ladder was brought to hosp on 9/23 and received ORIF for R distal radial fracture on 9/26.  Mildly displaced R ulnar styloid fracture not requiring sx.  Received TLSO for spinal injuries of T12 comp fx, T11 anterior wedging, B inferior articular process and spinous process fractures.  PMHx:  HA, colloid cyst of brain, idiopathic intracranial hypertension, LE edema, lumbar spondylosis and radiculopathy, chestpain, HLD    PT Comments    Pt progressing well towards goals. Increased ambulation distance using upright rollator. Required supervision for safety. Able to practice stair navigation as well, only requiring min guard A for safety. Educated about walking program and reviewed back precautions. Current recommendations appropriate. Will continue to follow acutely.     Recommendations for follow up therapy are one component of a multi-disciplinary discharge planning process, led by the attending physician.  Recommendations may be updated based on patient status, additional functional criteria and insurance authorization.  Follow Up Recommendations  Home health PT;Supervision for mobility/OOB     Equipment Recommendations  Other (comment) (upright rollator)    Recommendations for Other Services       Precautions / Restrictions Precautions Precautions: Fall;Back Precaution Booklet Issued: No Precaution Comments: Able to recall 3/3 precautions Required Braces or Orthoses: Splint/Cast;Spinal Brace Spinal Brace: Thoracolumbosacral orthotic;Applied in sitting position Splint/Cast: to R UE Restrictions Weight Bearing Restrictions: Yes RUE Weight Bearing: Weight bear through elbow only     Mobility  Bed Mobility Overal bed mobility: Needs Assistance Bed Mobility: Rolling;Sidelying to Sit Rolling:  Supervision Sidelying to sit: Supervision       General bed mobility comments: Supervision for safety. Demonstrated appropriate log roll technique    Transfers Overall transfer level: Needs assistance Equipment used:  (upright rollator) Transfers: Sit to/from Stand Sit to Stand: Supervision         General transfer comment: Supervision for safety. Demonstrated safe hand placement.  Ambulation/Gait Ambulation/Gait assistance: Supervision Gait Distance (Feet): 500 Feet Assistive device:  (upright rollator) Gait Pattern/deviations: Step-through pattern;Decreased stride length;Narrow base of support Gait velocity: Decreased   General Gait Details: Supervision for safety. No overt LOB noted. Educated about walking program to perform at home.   Stairs Stairs: Yes Stairs assistance: Min guard Stair Management: One rail Left;Step to pattern;Forwards;Sideways Number of Stairs: 3 General stair comments: Used forwards technique for ascending steps and sideways technique for descending. Min guard for safety. No LOB noted.   Wheelchair Mobility    Modified Rankin (Stroke Patients Only)       Balance Overall balance assessment: Needs assistance Sitting-balance support: Feet supported Sitting balance-Leahy Scale: Good     Standing balance support: Bilateral upper extremity supported;No upper extremity supported Standing balance-Leahy Scale: Fair Standing balance comment: Able to maintain static standing without UE support                            Cognition Arousal/Alertness: Awake/alert Behavior During Therapy: WFL for tasks assessed/performed Overall Cognitive Status: Within Functional Limits for tasks assessed                                        Exercises      General Comments  Pertinent Vitals/Pain Pain Assessment: No/denies pain    Home Living                      Prior Function            PT Goals  (current goals can now be found in the care plan section) Acute Rehab PT Goals Patient Stated Goal: get home soon PT Goal Formulation: With patient Time For Goal Achievement: 07/27/21 Potential to Achieve Goals: Good Progress towards PT goals: Progressing toward goals    Frequency    Min 5X/week      PT Plan Current plan remains appropriate    Co-evaluation              AM-PAC PT "6 Clicks" Mobility   Outcome Measure  Help needed turning from your back to your side while in a flat bed without using bedrails?: None Help needed moving from lying on your back to sitting on the side of a flat bed without using bedrails?: A Little Help needed moving to and from a bed to a chair (including a wheelchair)?: A Little Help needed standing up from a chair using your arms (e.g., wheelchair or bedside chair)?: A Little Help needed to walk in hospital room?: A Little Help needed climbing 3-5 steps with a railing? : A Little 6 Click Score: 19    End of Session Equipment Utilized During Treatment: Gait belt;Back brace Activity Tolerance: Patient tolerated treatment well Patient left: in chair;with call bell/phone within reach Nurse Communication: Mobility status PT Visit Diagnosis: Unsteadiness on feet (R26.81);History of falling (Z91.81) Pain - Right/Left: Right Pain - part of body: Arm     Time: 7001-7494 PT Time Calculation (min) (ACUTE ONLY): 13 min  Charges:  $Gait Training: 8-22 mins                     Cindee Salt, DPT  Acute Rehabilitation Services  Pager: [phone] Office: [phone]    Craig Church 07/18/2021, 10:06 AM

## 2021-07-18 NOTE — Progress Notes (Signed)
Rounded on pt, answered questions and resolved needs.

## 2021-07-18 NOTE — Progress Notes (Signed)
Occupational Therapy Treatment Patient Details Name: Craig Church MRN: [id number] DOB: 1966/07/05 Today's Date: 07/18/2021   History of present illness 55 yo male with onset of fall at work off a ladder was brought to hosp on 9/23 and received ORIF for R distal radial fracture on 9/26.  Mildly displaced R ulnar styloid fracture not requiring sx.  Received TLSO for spinal injuries of T12 comp fx, T11 anterior wedging, B inferior articular process and spinous process fractures.  PMHx:  HA, colloid cyst of brain, idiopathic intracranial hypertension, LE edema, lumbar spondylosis and radiculopathy, chestpain, HLD   OT comments  Pt making great progress with OT goals this session. Pt educated on compensatory strategies for ADL's following all precautions and was able to demonstrate them with minimal assistance. Pain continues to limit pt's performance, however he reports that it is much better now. Overall functional mobility continues to improve as well with the assist from the upright rollator. OT will continue to follow.    Recommendations for follow up therapy are one component of a multi-disciplinary discharge planning process, led by the attending physician.  Recommendations may be updated based on patient status, additional functional criteria and insurance authorization.    Follow Up Recommendations  Home health OT;Supervision/Assistance - 24 hour    Equipment Recommendations  3 in 1 bedside commode;Tub/shower bench;Other (comment)    Recommendations for Other Services      Precautions / Restrictions Precautions Precautions: Fall;Back Precaution Booklet Issued: No Precaution Comments: Able to recall 3/3 precautions Required Braces or Orthoses: Splint/Cast;Spinal Brace Spinal Brace: Thoracolumbosacral orthotic;Applied in sitting position Splint/Cast: to R UE Restrictions Weight Bearing Restrictions: Yes RUE Weight Bearing: Weight bear through elbow only       Mobility Bed Mobility                General bed mobility comments: Up in reclienr on entry    Transfers Overall transfer level: Needs assistance Equipment used: Right platform walker Transfers: Sit to/from Stand Sit to Stand: Supervision         General transfer comment: Supervision for safety. Demonstrated safe hand placement.    Balance Overall balance assessment: Needs assistance Sitting-balance support: Feet supported Sitting balance-Leahy Scale: Good     Standing balance support: Bilateral upper extremity supported;No upper extremity supported Standing balance-Leahy Scale: Fair Standing balance comment: Able to maintain static standing without UE support                           ADL either performed or assessed with clinical judgement   ADL Overall ADL's : Needs assistance/impaired                                       General ADL Comments: Session focused on education of compensatory strategies for ADL's, which pt was able to practice/demonstrate during session with no physical assist.     Vision       Perception     Praxis      Cognition Arousal/Alertness: Awake/alert Behavior During Therapy: WFL for tasks assessed/performed Overall Cognitive Status: Within Functional Limits for tasks assessed                                          Exercises     Shoulder  Instructions       General Comments VSS, pt very aware of his safety and precautions.    Pertinent Vitals/ Pain       Pain Assessment: 0-10 Pain Score: 3  Pain Location: R wrist and back Pain Descriptors / Indicators: Guarding Pain Intervention(s): Limited activity within patient's tolerance;Monitored during session;Repositioned  Home Living                                          Prior Functioning/Environment              Frequency  Min 2X/week        Progress Toward Goals  OT Goals(current goals can now be found in the care  plan section)  Progress towards OT goals: Progressing toward goals  Acute Rehab OT Goals Patient Stated Goal: get home soon OT Goal Formulation: With patient Time For Goal Achievement: 07/29/21 Potential to Achieve Goals: Good ADL Goals Pt Will Perform Grooming: standing;with modified independence Pt Will Perform Lower Body Dressing: with modified independence;with adaptive equipment;sit to/from stand Pt Will Transfer to Toilet: with modified independence;ambulating;bedside commode Pt Will Perform Toileting - Clothing Manipulation and hygiene: with modified independence;sit to/from stand Additional ADL Goal #1: Pt will manage back brace with modified independence.  Plan Discharge plan remains appropriate;Frequency remains appropriate    Co-evaluation                 AM-PAC OT "6 Clicks" Daily Activity     Outcome Measure   Help from another person eating meals?: A Little Help from another person taking care of personal grooming?: A Little Help from another person toileting, which includes using toliet, bedpan, or urinal?: A Lot Help from another person bathing (including washing, rinsing, drying)?: A Lot Help from another person to put on and taking off regular upper body clothing?: A Lot Help from another person to put on and taking off regular lower body clothing?: A Lot 6 Click Score: 14    End of Session Equipment Utilized During Treatment: Gait belt;Back brace  OT Visit Diagnosis: Other abnormalities of gait and mobility (R26.89);Muscle weakness (generalized) (M62.81);Pain Pain - Right/Left: Right Pain - part of body: Hand;Arm   Activity Tolerance Patient tolerated treatment well   Patient Left in chair;with call bell/phone within reach   Nurse Communication Mobility status        Time: 1404-1430 OT Time Calculation (min): 26 min  Charges: OT General Charges $OT Visit: 1 Visit OT Treatments $Self Care/Home Management : 8-22 mins $Therapeutic Activity:  8-22 mins  Briceida Rasberry H., OTR/L Acute Rehabilitation  Azaiah Licciardi Elane Sheamus Hasting 07/18/2021, 4:17 PM

## 2021-07-19 NOTE — TOC Progression Note (Signed)
Transition of Care [facility]) - Progression Note    Patient Details  Name: Craig Church MRN: [id number] Date of Birth: [birth year]  [facility]) CM/SW Contact  Glennon Mac, RN Phone Number: 07/19/2021, 1:40pm  Clinical Narrative:    [facility] Claim #[id number] Patient states he has not heard from anyone regarding his equipment or home health services.  I spoke with Flora Lipps, Worker's Comp. adjuster, who directed me to speak with [employer], phone number [phone] for DME needs, and [employer], phone number [phone] for home health needs.  Spoke with Earvin Hansen at [facility], [phone], extension [phone] regarding DME needs; he states that he has received orders for [employer] this morning, and will have equipment overnighted to patient.  He states he will call Mr. Mckinnon to update him.  I spoke with Deanna Artis Lumpkin at [facility]; she states they have received orders for home health PT/OT, and are currently working on staffing the services.  She does state that this has been tagged as a urgent case.  Trauma case manager to receive confirmation email or phone call once home health services have been staffed.   Expected Discharge Plan: Home w Home Health Services Barriers to Discharge: Continued Medical Work up  Expected Discharge Plan and Services Expected Discharge Plan: Home w Home Health Services   Discharge Planning Services: CM Consult Post Acute Care Choice: Home Health Living arrangements for the past 2 months: Single Family Home Expected Discharge Date: 07/17/21               DME Arranged: Cherre Huger platform   Date DME Agency Contacted: 07/16/21 Time DME Agency Contacted: 1000 Representative spoke with at DME Agency: Flora Lipps, adjuster with [facility] HH Arranged: PT, OT HH Agency: Other - See comment Date HH Agency Contacted: 07/16/21 Time HH Agency Contacted: 1000 Representative spoke with at [facility] Agency: Flora Lipps adjuster with [facility]   Social Determinants of Health (SDOH)  Interventions    Readmission Risk Interventions No flowsheet data found.  Quintella Baton, RN, BSN  Trauma/Neuro ICU Case Manager [phone]

## 2021-07-19 NOTE — Progress Notes (Signed)
Patient has CPAP machine from home.  Patient is familiar with equipment and procedure and is able to apply and remove mask on his own.  Patient is compliant with CPAP therapy.

## 2021-07-19 NOTE — Progress Notes (Signed)
Orthopedic Tech Progress Note Patient Details:  Craig Church 1965-11-03 [id number]  Patient ID: Craig Church, male   DOB: January 25, 1966, 55 y.o.   MRN: [id number] Patient has TLSO according to RN. Trinna Post 07/19/2021, 3:01 AM

## 2021-07-19 NOTE — Progress Notes (Signed)
Patient ID: Craig Church, male   DOB: [birth year], 55 y.o.   MRN: [id number]  [facility] Surgery Progress Note  7 Days Post-Op  Subjective: CC-  Up in chair. Has not heard from workers comp regarding equipment, nothing has been delivered to his house. Doing well. Pain well controlled. BM 2 days ago.  Objective: Vital signs in last 24 hours: Temp:  [97.8 F (36.6 C)-98 F (36.7 C)] 97.9 F (36.6 C) (10/03 0812) Pulse Rate:  [60-76] 60 (10/03 0812) Resp:  [14-19] 14 (10/03 0812) BP: (104-110)/(64-70) 104/68 (10/03 0812) SpO2:  [96 %-98 %] 97 % (10/03 0812) Last BM Date: 07/19/21  Intake/Output from previous day: 10/02 0701 - 10/03 0700 In: 200 [P.O.:200] Out: 500 [Urine:500] Intake/Output this shift: No intake/output data recorded.  PE: Gen:  Alert, NAD Abd: Soft, NT/ND Ext:  calves soft and nontender. Splint to RUE, able to wiggle fingers, good cap refill Psych: A&Ox3 Neuro: No gross motor or sensory deficits BUE/BLE Skin: no rashes noted, warm and dry   Lab Results:  No results for input(s): WBC, HGB, HCT, PLT in the last 72 hours. BMET No results for input(s): NA, K, CL, CO2, GLUCOSE, BUN, CREATININE, CALCIUM in the last 72 hours. PT/INR No results for input(s): LABPROT, INR in the last 72 hours. CMP     Component Value Date/Time   NA 137 07/16/2021 0439   K 3.4 (L) 07/16/2021 0439   CL 106 07/16/2021 0439   CO2 20 (L) 07/16/2021 0439   GLUCOSE 96 07/16/2021 0439   BUN 27 (H) 07/16/2021 0439   CREATININE 0.96 07/16/2021 0439   CALCIUM 9.2 07/16/2021 0439   GFRNONAA >60 07/16/2021 0439   Lipase  No results found for: LIPASE     Studies/Results: No results found.  Anti-infectives: Anti-infectives (From admission, onward)    Start     Dose/Rate Route Frequency Ordered Stop   07/12/21 0900  clindamycin (CLEOCIN) IVPB 900 mg        900 mg 100 mL/hr over 30 Minutes Intravenous On call to O.R. 07/12/21 0810 07/12/21 1516         Assessment/Plan Fall from ladder Mediastinal hematoma - EKG NSR Right distal radius fracture/ mildly displaced ulnar styloid fracture - s/p ORIF w/ Dr. Arita Miss 9/26. PT/OT. Splint to remain in place for 1 week, patient can then have a splint made by OT if discharged prior to 1 week. WBAT through elbow and NWB from elbow down  T12 compression fracture/ T11 anterior wedging, T11 bilateral inferior articular process, and T11 spinous process fx - per Dr. Franky Macho, Recommend aspen lumbar brace with thoracic extension when out of bed, may don doff when sitting up. Okay to be up Seizure d/o HTN HLD OSA  8 mm LLL lung nodule- future CT rec 6-12 months.  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 Bowel regimen VTE - lovenox and ASA Foley - none Plan: Medically ready for discharge once equipment delivered to his house. Case management going to reach out to workers comp to see what's going on. Medications have been sent to his pharmacy.    LOS: 10 days    Franne Forts, [facility] Surgery 07/19/2021, 10:20 AM Please see Amion for pager number during day hours 7:00am-4:30pm

## 2021-07-19 NOTE — Progress Notes (Signed)
Physical Therapy Treatment Patient Details Name: Craig Church MRN: [id number] DOB: [birth year] Today's Date: 07/19/2021   History of Present Illness 55 yo male with onset of fall at work off a ladder was brought to hosp on 9/23 and received ORIF for R distal radial fracture on 9/26.  Mildly displaced R ulnar styloid fracture not requiring sx.  Received TLSO for spinal injuries of T12 comp fx, T11 anterior wedging, B inferior articular process and spinous process fractures.  PMHx:  HA, colloid cyst of brain, idiopathic intracranial hypertension, LE edema, lumbar spondylosis and radiculopathy, chestpain, HLD    PT Comments    Pt admitted with above diagnosis. Pt was able to ambulate with min guard assist as well as go up and down steps today. Progressing.  Decr frequency to 4 x week as pt has made progress and is ambulating in room on his own some. Pt definitely does better with the upright walker.  CM is trying to get one ordered for pt.  Will continue to follow.   Pt currently with functional limitations due to the deficits listed below (see PT Problem List). Pt will benefit from skilled PT to increase their independence and safety with mobility to allow discharge to the venue listed below.      Recommendations for follow up therapy are one component of a multi-disciplinary discharge planning process, led by the attending physician.  Recommendations may be updated based on patient status, additional functional criteria and insurance authorization.  Follow Up Recommendations  Home health PT;Supervision for mobility/OOB     Equipment Recommendations  Other (comment) (upright rollator)    Recommendations for Other Services Rehab consult     Precautions / Restrictions Precautions Precautions: Fall;Back Precaution Booklet Issued: No Precaution Comments: Able to recall 3/3 precautions Required Braces or Orthoses: Splint/Cast;Spinal Brace Spinal Brace: Thoracolumbosacral orthotic;Applied in sitting  position Splint/Cast: to R UE Restrictions RUE Weight Bearing: Weight bear through elbow only Other Position/Activity Restrictions: verbal order from Scheeler, Kermit Balo, PA-C: weightbearing through elbow only; NWB wrist     Mobility  Bed Mobility               General bed mobility comments: Up in recliner on entry    Transfers Overall transfer level: Needs assistance Equipment used: Right platform walker Transfers: Sit to/from Stand Sit to Stand: Supervision         General transfer comment: Supervision for safety. Demonstrated safe hand placement.  Ambulation/Gait Ambulation/Gait assistance: Supervision Gait Distance (Feet): 500 Feet Assistive device:  (upright rollator) Gait Pattern/deviations: Step-through pattern;Decreased stride length;Narrow base of support Gait velocity: Decreased   General Gait Details: Supervision for safety. No overt LOB noted. Pt is safe with locking and unlocking brakes as well.   Stairs   Stairs assistance: Min guard Stair Management: One rail Left;Step to pattern;Forwards;Sideways Number of Stairs: 4 General stair comments: Used forwards technique for ascending steps and sideways technique for descending. Min guard for safety. No LOB noted.   Wheelchair Mobility    Modified Rankin (Stroke Patients Only)       Balance Overall balance assessment: Needs assistance Sitting-balance support: Feet supported Sitting balance-Leahy Scale: Good   Postural control: Posterior lean Standing balance support: Bilateral upper extremity supported;No upper extremity supported Standing balance-Leahy Scale: Fair Standing balance comment: Able to maintain static standing without UE support                            Cognition Arousal/Alertness:  Awake/alert Behavior During Therapy: WFL for tasks assessed/performed Overall Cognitive Status: Within Functional Limits for tasks assessed                                         Exercises General Exercises - Lower Extremity Ankle Circles/Pumps: AROM;Both;10 reps;Seated Long Arc Quad: AROM;Both;10 reps;Seated    General Comments General comments (skin integrity, edema, etc.): VSS      Pertinent Vitals/Pain Pain Assessment: No/denies pain    Home Living                      Prior Function            PT Goals (current goals can now be found in the care plan section) Acute Rehab PT Goals Patient Stated Goal: get home soon Progress towards PT goals: Progressing toward goals    Frequency    Min 4X/week      PT Plan Frequency needs to be updated    Co-evaluation              AM-PAC PT "6 Clicks" Mobility   Outcome Measure  Help needed turning from your back to your side while in a flat bed without using bedrails?: None Help needed moving from lying on your back to sitting on the side of a flat bed without using bedrails?: A Little Help needed moving to and from a bed to a chair (including a wheelchair)?: A Little Help needed standing up from a chair using your arms (e.g., wheelchair or bedside chair)?: A Little Help needed to walk in hospital room?: A Little Help needed climbing 3-5 steps with a railing? : A Little 6 Click Score: 19    End of Session Equipment Utilized During Treatment: Gait belt;Back brace Activity Tolerance: Patient tolerated treatment well Patient left: in chair;with call bell/phone within reach Nurse Communication: Mobility status PT Visit Diagnosis: Unsteadiness on feet (R26.81);History of falling (Z91.81) Pain - Right/Left: Right Pain - part of body: Arm     Time: 7654-6503 PT Time Calculation (min) (ACUTE ONLY): 19 min  Charges:  $Gait Training: 8-22 mins                     Sruti Ayllon M,PT Acute Rehab Services [phone] (pager)    Bevelyn Buckles 07/19/2021, 4:08 PM

## 2021-07-20 NOTE — Discharge Summary (Signed)
[facility] Discharge Summary   Patient ID: Craig Church MRN: [id number] DOB/AGE: Jan 17, 1966 55 y.o.  Admit date: 07/09/2021 Discharge date: 07/20/2021   Discharge Diagnosis Fall from ladder Mediastinal hematoma  Right distal radius fracture/ mildly displaced ulnar styloid fracture  T12 compression fracture/ T11 anterior wedging, T11 bilateral inferior articular process, and T11 spinous process fracture Seizure d/o HTN HLD OSA  8 mm LLL lung nodule CT w/ 0.6 cm likely colloid cyst within the region of the foramen of Monro and Partial empty sella   Consultants Orthopedics Neurosurgery  Imaging: No results found.  Procedures Dr. Arita Miss (07/12/2021) - Open reduction internal fixation comminuted intra-articular greater than 3 fragment right distal radius fracture  Hospital Course:  Dell Briner is a 55yo male PMH seizure disorder who presented to [facility] 9/23 as a level 2 trauma after a fall.  He was on the ladder and fell approximately 7 feet backwards.  He struck his head and his back.  Work up revealed the below listed injuries. He was admitted to the trauma service.  Mediastinal hematoma  Trace volume mediastinal hematoma posterior to manubrium. Patient was monitored clinically. EKG obtained and noted NSR.  Right distal radius fracture/ mildly displaced ulnar styloid fracture  Dr. Arita Miss was consulted and took the patient to the operating room on 9/26 for ORIF. Postoperatively he was advised WBAT through elbow and NWB from elbow down. Follow up with Dr. Arita Miss.  T12 compression fracture/ T11 anterior wedging, T11 bilateral inferior articular process, and T11 spinous process fx  Neurosurgery was consulted and recommended conservative management with aspen lumbar brace with thoracic extension when out of bed, may don doff when sitting up, okay to be up. Follow up with Dr. Franky Macho.  8 mm LLL lung nodule; likely colloid cyst within the region of the foramen of Monro and Partial  empty sella  Incidental findings on CT scan. Patient will follow up with PCP for any further necessary work up.  Patient worked with therapies during this admission who ultimately recommended home health PT/OT when discharged. On 10/4 the patient was felt to be stable for discharge home.  Patient will follow up as below and knows to call with questions or concerns.    I have personally reviewed the patients medication history on the  controlled substance database.    PE: Gen:  Alert, NAD Abd: Soft, NT/ND Ext:  calves soft and nontender. Splint to RUE, able to wiggle fingers, good cap refill Psych: A&Ox3 Neuro: No gross motor or sensory deficits BUE/BLE Skin: no rashes noted, warm and dry   Allergies as of 07/20/2021       Reactions   Penicillins Other (See Comments)   Unknown childhood reaction        Medication List     STOP taking these medications    acetaminophen 650 MG CR tablet Commonly known as: TYLENOL Replaced by: acetaminophen 500 MG tablet       TAKE these medications    acetaminophen 500 MG tablet Commonly known as: TYLENOL Take 2 tablets (1,000 mg total) by mouth every 6 (six) hours as needed for mild pain. Replaces: acetaminophen 650 MG CR tablet   aspirin EC 81 MG tablet Take 81 mg by mouth at bedtime. Swallow whole.   atorvastatin 10 MG tablet Commonly known as: LIPITOR Take 10 mg by mouth See admin instructions. Take one tablet (10 mg) by mouth every other night   diclofenac Sodium 1 % Gel Commonly known as: VOLTAREN Apply 1 application  topically daily as needed (arthritis pain).   esomeprazole 20 MG capsule Commonly known as: NEXIUM Take 20 mg by mouth 2 (two) times daily before a meal.   gabapentin 800 MG tablet Commonly known as: NEURONTIN Take 800 mg by mouth 2 (two) times daily.   losartan-hydrochlorothiazide 50-12.5 MG tablet Commonly known as: HYZAAR Take 1 tablet by mouth every morning.   methocarbamol 500 MG  tablet Commonly known as: ROBAXIN Take 2 tablets (1,000 mg total) by mouth every 8 (eight) hours as needed for muscle spasms.   naproxen 500 MG tablet Commonly known as: NAPROSYN Take 500 mg by mouth 2 (two) times daily.   Oxycodone HCl 10 MG Tabs Take 0.5-1 tablets (5-10 mg total) by mouth every 6 (six) hours as needed for moderate pain or severe pain (5mg  for moderate pain,10mg  for severe pain).   polyethylene glycol 17 g packet Commonly known as: MIRALAX / GLYCOLAX Take 17 g by mouth daily as needed for mild constipation.   rOPINIRole 1 MG tablet Commonly known as: REQUIP Take 1 mg by mouth 2 (two) times daily.   topiramate 100 MG tablet Commonly known as: TOPAMAX Take 100 mg by mouth 2 (two) times daily.   venlafaxine XR 37.5 MG 24 hr capsule Commonly known as: EFFEXOR-XR Take 75 mg by mouth every morning.               Durable Medical Equipment  (From admission, onward)           Start     Ordered   07/20/21 0959  For home use only DME Walker rolling  Once       Question Answer Comment  Walker: With 5 Inch Wheels   Patient needs a walker to treat with the following condition T12 compression fracture (HCC)      07/20/21 0959   07/17/21 1206  For home use only DME Other see comment  Once       Comments: Platform rollator  Question:  Length of Need  Answer:  6 Months   07/17/21 1205   07/16/21 1007  For home use only DME 3 n 1  Once        07/16/21 1006   07/16/21 0906  For home use only DME Walker platform  Once       Question Answer Comment  Patient needs a walker to treat with the following condition Closed fracture of right distal radius   Patient needs a walker to treat with the following condition Closed T12 fracture (HCC)      07/16/21 0906              Follow-up Information     07/18/21, MD Follow up in 3 week(s).   Specialty: Neurosurgery Why: For your back fractures Contact information: [address] [place] [phone]         [phone], NP Follow up.   Specialty: Family Medicine Why: For hospital follow up - discuss incidental CT scan findings (8 mm lung nodule, and likely colloid cyst seen on CT scan of head) Contact information: [address] [place] [phone]         [phone], MD Follow up.   Specialty: Plastic Surgery Why: For your right wrist fracture Contact information: [address] [place] [phone]         [facility] Follow up.  Why: As needed Contact information: [address] [place] [address] [phone]        [facility] adjuster Follow up.   Why: Call adjuster with questions regarding home health follow up and equipment Contact information: Adjuster is Flora Lipps, phone number [phone], fax #[phone].                Signed: Franne Forts, PA-C [facility] 07/20/2021, 11:07 AM Please see Amion for pager number during day hours 7:00am-4:30pm

## 2021-07-20 NOTE — TOC Transition Note (Signed)
Transition of Care [facility]) - CM/SW Discharge Note   Patient Details  Name: Craig Church MRN: [id number] Date of Birth: 09-18-1966  [facility] CM/SW Contact:  Glennon Mac, RN Phone Number: 07/20/2021, 11:56 AM   Clinical Narrative:    Patient states he has received word about DME delivery, but was told that the walker they are sending does not have wheels.  Pt/SO upset about this and don't understand why DME has not been delivered.  Explained to pt and SO that WC has to approve orders, and send out to a HH/DME agency to be carried out.  Orders were sent to [facility] adjuster on Friday AM, and he confirmed receipt of orders. Offered to order RW with platform from [employer] at private pay cost of $131.99, and patient can have WC reimburse him for the cost.  Pt/SO agreeable to this plan.  Referral to [employer] for platform RW, to be delivered to bedside prior to dc.     Final next level of care: Home w Home Health Services Barriers to Discharge: Barriers Resolved   Patient Goals and CMS Choice Patient states their goals for this hospitalization and ongoing recovery are:: to go home CMS Medicare.gov Compare Post Acute Care list provided to:: Patient Choice offered to / list presented to : Patient                        Discharge Plan and Services   Discharge Planning Services: CM Consult Post Acute Care Choice: Home Health          DME Arranged: 3-N-1, Dan Humphreys platform   Date DME Agency Contacted: 07/16/21 Time DME Agency Contacted: 1000 Representative spoke with at DME Agency: Flora Lipps, adjuster with [facility] HH Arranged: PT, OT HH Agency: Other - See comment Date HH Agency Contacted: 07/16/21 Time HH Agency Contacted: 1000 Representative spoke with at [facility] Agency: [employer] for platform RW  Social Determinants of Health (SDOH) Interventions     Readmission Risk Interventions No flowsheet data found.  Quintella Baton, RN, BSN  Trauma/Neuro ICU Case  Manager [phone]

## 2021-07-28 ENCOUNTER — Other Ambulatory Visit: Payer: Self-pay

## 2021-07-28 ENCOUNTER — Ambulatory Visit (INDEPENDENT_AMBULATORY_CARE_PROVIDER_SITE_OTHER): Payer: Worker's Compensation | Admitting: Plastic Surgery

## 2021-07-28 ENCOUNTER — Ambulatory Visit (HOSPITAL_COMMUNITY)
Admission: RE | Admit: 2021-07-28 | Discharge: 2021-07-28 | Disposition: A | Discharge status: Home / Self Care | Payer: Worker's Compensation | Source: Ambulatory Visit | Attending: Surgical | Admitting: Surgical

## 2021-07-28 DIAGNOSIS — S52501A Unspecified fracture of the lower end of right radius, initial encounter for closed fracture: Secondary | ICD-10-CM | POA: Insufficient documentation

## 2021-07-28 DIAGNOSIS — W19XXXA Unspecified fall, initial encounter: Secondary | ICD-10-CM

## 2021-07-28 NOTE — Progress Notes (Signed)
Patient is here about 2 weeks postop from open reduction internal fixation of distal radius fracture.  He feels like things are coming along okay.  He has yet to go to therapy and has not gotten follow-up x-ray as of yet.  On exam his sensation is intact to his fingers.  Flexion extension is intact to all fingers and thumb.  His wound was examined and looks to be doing fine and healing appropriately.  We will plan to send him for his x-ray and get a volar splint made in therapy.  We will see him again in a few weeks as long as there is nothing unexpected on the imaging.

## 2021-07-29 ENCOUNTER — Telehealth: Payer: Self-pay

## 2021-07-29 NOTE — Telephone Encounter (Signed)
Craig Church called for the patient to say that since his surgery on 9/25, he has broken his back in three places.  His adjuster thinks that the patient should not travel.  They would like to know if Dr. Arita Miss can put an order in for home therapy.  Please call.

## 2021-08-02 ENCOUNTER — Telehealth: Payer: Self-pay

## 2021-08-02 NOTE — Telephone Encounter (Addendum)
Returned patients call. Dr Arita Miss declined request for pain medication refill. He can alternate tylenol 500mg  with IBU 600mg . For additional pain management for the back, he can call the neurosurgeon or, trauma physician who treated him in the ED.  Patient understood and agreed with plan.

## 2021-08-02 NOTE — Telephone Encounter (Signed)
Called adjuster back, LMVM.  Dr. Arita Miss declined to change order to home OT. Adjuster indicated patient has a broken back in 3 places.  Patient will have to be assessed by the neurosurgeon or, trauma physician who treated him at the ED to indicate patient is unable to drive to OT.

## 2021-08-02 NOTE — Telephone Encounter (Signed)
Patient called to say that he has two pain pills left and would like to know if he can get a refill.  Please call.  *Patient's preferred pharmacy is [facility] in [place].

## 2021-08-03 ENCOUNTER — Telehealth: Payer: Self-pay | Admitting: Occupational Therapy

## 2021-08-03 NOTE — Telephone Encounter (Signed)
Dr. Lavon Paganini Scheeler PA-C, Craig Church is scheduled for fabrication of volar  wrist splint tomorrow.  Would you like for him to begin A/ROM or should we wait until after next follow up visit with you? Thanks, [employer], OTR/L

## 2021-08-04 ENCOUNTER — Other Ambulatory Visit: Payer: Self-pay

## 2021-08-04 ENCOUNTER — Ambulatory Visit: Payer: Worker's Compensation | Attending: Surgical | Admitting: Occupational Therapy

## 2021-08-04 ENCOUNTER — Encounter: Payer: Self-pay | Admitting: Occupational Therapy

## 2021-08-04 DIAGNOSIS — M25531 Pain in right wrist: Secondary | ICD-10-CM | POA: Insufficient documentation

## 2021-08-04 DIAGNOSIS — R2689 Other abnormalities of gait and mobility: Secondary | ICD-10-CM | POA: Insufficient documentation

## 2021-08-04 DIAGNOSIS — R278 Other lack of coordination: Secondary | ICD-10-CM | POA: Insufficient documentation

## 2021-08-04 DIAGNOSIS — M25631 Stiffness of right wrist, not elsewhere classified: Secondary | ICD-10-CM | POA: Insufficient documentation

## 2021-08-04 DIAGNOSIS — M6281 Muscle weakness (generalized): Secondary | ICD-10-CM | POA: Diagnosis present

## 2021-08-04 DIAGNOSIS — R600 Localized edema: Secondary | ICD-10-CM | POA: Insufficient documentation

## 2021-08-04 NOTE — Therapy (Signed)
[facility] [address] [place], [place], [address] Phone: [phone]   Fax:  [phone]  Occupational Therapy Treatment  Patient Details  Name: Craig Church MRN: [id number] Date of Birth: 05-28-1966 Referring Provider (OT): Altha Harm PA-C   Encounter Date: 08/04/2021   OT End of Session - 08/04/21 0809     Visit Number 1    Number of Visits 25    Date for OT Re-Evaluation 10/27/21    Authorization Type workers comp    OT Start Time 0805    OT Stop Time 0926    OT Time Calculation (min) 81 min             Past Medical History:  Diagnosis Date   Hypertension    Seizure disorder (HCC)     Past Surgical History:  Procedure Laterality Date   CHOLECYSTECTOMY     OPEN REDUCTION INTERNAL FIXATION (ORIF) DISTAL PHALANX Right 07/12/2021   Procedure: OPEN REDUCTION INTERNAL FIXATION (ORIF) DISTAL RADIUS;  Surgeon: Allena Napoleon, MD;  Location: [facility] OR;  Service: Plastics;  Laterality: Right;    There were no vitals filed for this visit.   Subjective Assessment - 08/04/21 0940     Subjective  Pt fell from a train car    Pertinent History 55 yo male with onset of fall at work off a ladder was brought to hosp on 9/23 and received ORIF for R distal radial fracture on 9/26.  Mildly displaced R ulnar styloid fracture not requiring sx.  Received TLSO for spinal injuries of T12 comp fx, T11 anterior wedging, B inferior articular process and spinous process fractures.  PMHx:  HA, colloid cyst of brain, idiopathic intracranial hypertension, LE edema, lumbar spondylosis and radiculopathy, chestpain, HLD, seizure disorder.    Limitations no ROM unitl after next MD appointment, NWB through wrist    Patient Stated Goals custom splint    Currently in Pain? Yes    Pain Score 6     Pain Location Wrist    Pain Orientation Right    Pain Descriptors / Indicators Aching    Pain Type Surgical pain    Pain Onset More than a  month ago    Pain Frequency Intermittent    Aggravating Factors  moving    Pain Relieving Factors meds                [facility] OT Assessment - 08/04/21 0951       Assessment   Medical Diagnosis ORIF R distal radius fx, ulnar styloid fx(non surgical tx)    Referring Provider (OT) Lysle Morales Scheeler PA-C    Onset Date/Surgical Date 07/12/21    Next MD Visit 08/25/21      Precautions   Precautions Fall;Back    Precaution Comments NWB through R wrist, no A/ROM unitl next MD visit, splint at all times, TLSO      Restrictions   Weight Bearing Restrictions Yes    RUE Weight Bearing Non weight bearing   through wrist     Balance Screen   Has the patient fallen in the past 6 months Yes    How many times? 1    Has the patient had a decrease in activity level because of a fear of falling?  Yes    Is the patient reluctant to leave their home because of a fear of falling?  No      Home  Environment   Family/patient expects to be discharged to: Private  residence    Lives With Spouse      Prior Function   Level of Independence Independent    Vocation Full time employment    Vocation Requirements lifting, pushing pulling, climbing      Vision Assessment   Comment Pt is requiring assist with all basic ADLS due to back precautions and weight bearing precautions for RUE.      Sensation   Light Touch Appears Intact      Coordination   Gross Motor Movements are Fluid and Coordinated No    Fine Motor Movements are Fluid and Coordinated No      Edema   Edema moderate in R hand and wrist, volar incision with steri strips in place      ROM / Strength   AROM / PROM / Strength AROM      AROM   Overall AROM  Unable to assess;Due to precautions    Overall AROM Comments no ROM until after MD appointment                      OT Treatments/Exercises (OP) - 08/04/21 0001       Splinting   Splinting Pt arrived in post surgical cast. Pt was carefully unwrapped and hand and  wrist were cleaned with soap and water. Steri-strips in place at volar wrist incision , no s/s of infection.Forearm was dressed with stockinette. Pt was fitted with a volar wrist splint with digits free. Wrist approximating neutral. Pt was educated in gentle finger ROM while in splint.                    OT Education - 08/04/21 0950     Education Details hand hygine, splint wear, care and precautions, no A/ROM at wrist until cleared by MD, NWB though wrist    Person(s) Educated Patient;Spouse    Methods Explanation;Demonstration;Verbal cues;Handout    Comprehension Verbalized understanding;Returned demonstration;Verbal cues required              OT Short Term Goals - 08/04/21 0958       OT SHORT TERM GOAL #1   Title I with splint wear, care and precautions    Time 6    Period Weeks    Status New    Target Date 09/15/21      OT SHORT TERM GOAL #2   Title I with inital HEP    Time 6    Period Weeks    Status New      OT SHORT TERM GOAL #3   Title Pt will demonstrate ability to perfrom at least 90% composite finger flexion in prep for functional use.    Time 6    Period Weeks    Status New      OT SHORT TERM GOAL #4   Title Pt will demonstrate wrist flexion/ extension of at least 40* in prep for functional use.    Time 6    Period Weeks    Status New      OT SHORT TERM GOAL #5   Title I with edema control.    Time 6    Period Weeks    Status New               OT Long Term Goals - 08/04/21 1001       OT LONG TERM GOAL #1   Title I with updated HEP.    Time 12    Period Weeks  Status New    Target Date 10/27/21      OT LONG TERM GOAL #2   Title Pt will demonstrate grip strength of at least 40 lbs in prep for work activities.    Time 12    Period Weeks    Status New      OT LONG TERM GOAL #3   Title Pt will demonstrate wrist flexion/ extension WFLS for work activities.    Time 12    Period Weeks    Status New      OT LONG TERM GOAL  #4   Title Pt will demonstrate forearm supination/ pronation of at least 85* in prep for work.    Time 12    Period Weeks    Status New      OT LONG TERM GOAL #5   Title Pt will resume use of RUE at least 90% of the time for ADLS/ IADLS with pain no greater than 3/10.    Time 12    Period Weeks    Status New      Long Term Additional Goals   Additional Long Term Goals Yes      OT LONG TERM GOAL #6   Title Pt will perform simulated work activities modified independently    Time 12    Period Weeks    Status New                   Plan - 08/04/21 0943     Clinical Impression Statement 55 yo male with onset of fall at work off a ladder was brought to hosp on 9/23 and received ORIF for R distal radial fracture on 9/26 by Dr. Arita Miss.  Mildly displaced R ulnar styloid fracture not requiring sx.  Received TLSO for spinal injuries of T12 comp fx, T11 anterior wedging, B inferior articular process and spinous process fractures.  PMHx:  HA, colloid cyst of brain, idiopathic intracranial hypertension, LE edema, lumbar spondylosis and radiculopathy, chestpain, HLD, seizure disorder. Pt presents to occupational therapy with the following deficits : pain, decreased ROM, decreased strength, decreased LUE functional use edema.  Pt can benefit from skilled occupational therapy to address these deficits in order to maximize pt's safety and I with daily activities. Per message from Dr. Arita Miss, no ROM until after next MD appointment. Pt is currently NWB through wrist and using platform walker    OT Occupational Profile and History Detailed Assessment- Review of Records and additional review of physical, cognitive, psychosocial history related to current functional performance    Occupational performance deficits (Please refer to evaluation for details): ADL's;IADL's;Work;Social Participation    Body Structure / Function / Physical Skills ADL;UE functional  use;Endurance;Balance;Flexibility;Pain;FMC;ROM;Coordination;GMC;Decreased knowledge of precautions;Sensation;Scar mobility;Decreased knowledge of use of DME;IADL;Skin integrity;Dexterity;Strength;Mobility;Edema    Rehab Potential Good    Clinical Decision Making Limited treatment options, no task modification necessary    Comorbidities Affecting Occupational Performance: May have comorbidities impacting occupational performance    Modification or Assistance to Complete Evaluation  Min-Moderate modification of tasks or assist with assess necessary to complete eval    OT Frequency 2x / week    OT Duration 12 weeks   plus eval, anticipate d/c after 8-10 weeks dependent upon progress   OT Treatment/Interventions Self-care/ADL training;Ultrasound;Patient/family education;Scar mobilization;DME and/or AE instruction;Paraffin;Passive range of motion;Fluidtherapy;Cryotherapy;Functional Mobility Training;Splinting;Therapeutic activities;Manual Therapy;Moist Heat;Neuromuscular education    Plan splint check and modifications, no ROM until after next MD appointment    Consulted and Agree with Plan of Care  Patient;Family member/caregiver    Family Member Consulted spouse             Patient will benefit from skilled therapeutic intervention in order to improve the following deficits and impairments:   Body Structure / Function / Physical Skills: ADL, UE functional use, Endurance, Balance, Flexibility, Pain, FMC, ROM, Coordination, GMC, Decreased knowledge of precautions, Sensation, Scar mobility, Decreased knowledge of use of DME, IADL, Skin integrity, Dexterity, Strength, Mobility, Edema       Visit Diagnosis: Stiffness of right wrist, not elsewhere classified - Plan: Ot plan of care cert/re-cert  Pain in right wrist - Plan: Ot plan of care cert/re-cert  Other lack of coordination - Plan: Ot plan of care cert/re-cert  Localized edema - Plan: Ot plan of care cert/re-cert  Muscle weakness  (generalized) - Plan: Ot plan of care cert/re-cert  Other abnormalities of gait and mobility - Plan: Ot plan of care cert/re-cert    Problem List Patient Active Problem List   Diagnosis Date Noted   Fall 07/09/2021    Jaycub Noorani, OT/L 08/04/2021, 10:11 AM  [place] [facility] [address] [place], [place], [address] Phone: [phone]   Fax:  [phone]  Name: Craig Church MRN: [id number] Date of Birth: [birth year]

## 2021-08-04 NOTE — Patient Instructions (Signed)
WEARING SCHEDULE:  ?Wear splint at ALL times except for hygiene care  ? ?PURPOSE:  ?To prevent movement and for protection until injury can heal ? ?CARE OF SPLINT:  ?Keep splint away from heat sources including: stove, radiator or furnace, or a car in sunlight. The splint can melt and will no longer fit you properly ? ?Keep away from pets and children ? ?Clean the splint with rubbing alcohol 1-2 times per day.  ?* During this time, make sure you also clean your hand/arm as instructed by your therapist and/or perform dressing changes as needed. Then dry hand/arm completely before replacing splint. (When cleaning hand/arm, keep it immobilized in same position until splint is replaced) ? ?PRECAUTIONS/POTENTIAL PROBLEMS: ?*If you notice or experience increased pain, swelling, numbness, or a lingering reddened area from the splint: ?Contact your therapist immediately by calling [phone]. You must wear the splint for protection, but we will get you scheduled for adjustments as quickly as possible.  ?(If only straps or hooks need to be replaced and NO adjustments to the splint need to be made, just call the office ahead and let them know you are coming in) ? ?If you have any medical concerns or signs of infection, please call your doctor immediately ?  ?

## 2021-08-12 ENCOUNTER — Ambulatory Visit: Payer: Worker's Compensation | Attending: Family Medicine | Admitting: Occupational Therapy

## 2021-08-12 ENCOUNTER — Other Ambulatory Visit: Payer: Self-pay

## 2021-08-12 DIAGNOSIS — M6281 Muscle weakness (generalized): Secondary | ICD-10-CM | POA: Insufficient documentation

## 2021-08-12 DIAGNOSIS — R2689 Other abnormalities of gait and mobility: Secondary | ICD-10-CM | POA: Diagnosis present

## 2021-08-12 DIAGNOSIS — R278 Other lack of coordination: Secondary | ICD-10-CM | POA: Diagnosis present

## 2021-08-12 DIAGNOSIS — R600 Localized edema: Secondary | ICD-10-CM | POA: Diagnosis present

## 2021-08-12 DIAGNOSIS — M25631 Stiffness of right wrist, not elsewhere classified: Secondary | ICD-10-CM | POA: Insufficient documentation

## 2021-08-12 DIAGNOSIS — M25531 Pain in right wrist: Secondary | ICD-10-CM | POA: Diagnosis not present

## 2021-08-13 ENCOUNTER — Encounter: Payer: Self-pay | Admitting: Occupational Therapy

## 2021-08-13 NOTE — Therapy (Signed)
[facility] [address] [place], [place], [address] Phone: [phone]   Fax:  [phone]  Occupational Therapy Treatment  Patient Details  Name: Craig Church MRN: [id number] Date of Birth: 01-Jun-1966 Referring Provider (OT): Altha Harm PA-C   Encounter Date: 08/12/2021   OT End of Session - 08/13/21 0749     Visit Number 2    Number of Visits 25    Date for OT Re-Evaluation 10/27/21    Authorization Type workers comp    OT Start Time 1535    OT Stop Time 1605    OT Time Calculation (min) 30 min             Past Medical History:  Diagnosis Date   Hypertension    Seizure disorder (HCC)     Past Surgical History:  Procedure Laterality Date   CHOLECYSTECTOMY     OPEN REDUCTION INTERNAL FIXATION (ORIF) DISTAL PHALANX Right 07/12/2021   Procedure: OPEN REDUCTION INTERNAL FIXATION (ORIF) DISTAL RADIUS;  Surgeon: Allena Napoleon, MD;  Location: [facility] OR;  Service: Plastics;  Laterality: Right;    There were no vitals filed for this visit.   Subjective Assessment - 08/13/21 0747     Subjective  Pt reports significant pain    Pertinent History 55 yo male with onset of fall at work off a ladder was brought to hosp on 9/23 and received ORIF for R distal radial fracture on 9/26.  Mildly displaced R ulnar styloid fracture not requiring sx.  Received TLSO for spinal injuries of T12 comp fx, T11 anterior wedging, B inferior articular process and spinous process fractures.  PMHx:  HA, colloid cyst of brain, idiopathic intracranial hypertension, LE edema, lumbar spondylosis and radiculopathy, chestpain, HLD, seizure disorder.    Limitations no ROM unitl after next MD appointment, NWB through wrist    Patient Stated Goals custom splint    Currently in Pain? Yes    Pain Score 6     Pain Location Wrist    Pain Orientation Right    Pain Descriptors / Indicators Aching    Pain Type Surgical pain    Pain Onset More than a  month ago    Pain Frequency Intermittent    Aggravating Factors  movement    Pain Relieving Factors meds               Treatment: Pt arrived wearing splint however it had slid forwards on his wrist. Splint was removed and hand/ wrist were washed with soap and water. Pt has steri-strips in place. 4 x 4 was placed over incision and wrapped with gauze as pt reports they are rubbing. Dressing was topped with stockinette.  Therapist applied splint and reviewed proper application. Pt was instructed in IP, MP and composite flexion while wearing splint and that he can place ice pack on dorsal hand and wrist(wrapped in a towel) for edema control and pain  x 8 mins                   OT Education - 08/13/21 0747     Education Details hand hygiene, splint wear, care and precautions, no A/ROM at wrist until cleared by MD, A/ROM to digits while wearing splint for edema control. Use of ice for edema contol( no longer than 8 mins)    Person(s) Educated Patient;Spouse    Methods Explanation;Demonstration;Verbal cues    Comprehension Verbalized understanding;Returned demonstration;Verbal cues required  OT Short Term Goals - 08/04/21 0958       OT SHORT TERM GOAL #1   Title I with splint wear, care and precautions    Time 6    Period Weeks    Status New    Target Date 09/15/21      OT SHORT TERM GOAL #2   Title I with inital HEP    Time 6    Period Weeks    Status New      OT SHORT TERM GOAL #3   Title Pt will demonstrate ability to perfrom at least 90% composite finger flexion in prep for functional use.    Time 6    Period Weeks    Status New      OT SHORT TERM GOAL #4   Title Pt will demonstrate wrist flexion/ extension of at least 40* in prep for functional use.    Time 6    Period Weeks    Status New      OT SHORT TERM GOAL #5   Title I with edema control.    Time 6    Period Weeks    Status New               OT Long Term Goals -  08/04/21 1001       OT LONG TERM GOAL #1   Title I with updated HEP.    Time 12    Period Weeks    Status New    Target Date 10/27/21      OT LONG TERM GOAL #2   Title Pt will demonstrate grip strength of at least 40 lbs in prep for work activities.    Time 12    Period Weeks    Status New      OT LONG TERM GOAL #3   Title Pt will demonstrate wrist flexion/ extension WFLS for work activities.    Time 12    Period Weeks    Status New      OT LONG TERM GOAL #4   Title Pt will demonstrate forearm supination/ pronation of at least 85* in prep for work.    Time 12    Period Weeks    Status New      OT LONG TERM GOAL #5   Title Pt will resume use of RUE at least 90% of the time for ADLS/ IADLS with pain no greater than 3/10.    Time 12    Period Weeks    Status New      Long Term Additional Goals   Additional Long Term Goals Yes      OT LONG TERM GOAL #6   Title Pt will perform simulated work activities modified independently    Time 12    Period Weeks    Status New                   Plan - 08/13/21 0749     Clinical Impression Statement Pt arrived today wearing splint, however splint was malpositioned and slid forward.(Pt is now walking with upright platform style walker) Therapist reveiwed proper splint application with pt and wife.    OT Occupational Profile and History Detailed Assessment- Review of Records and additional review of physical, cognitive, psychosocial history related to current functional performance    Occupational performance deficits (Please refer to evaluation for details): ADL's;IADL's;Work;Social Participation    Body Structure / Function / Physical Skills ADL;UE functional use;Endurance;Balance;Flexibility;Pain;FMC;ROM;Coordination;GMC;Decreased knowledge of precautions;Sensation;Scar  mobility;Decreased knowledge of use of DME;IADL;Skin integrity;Dexterity;Strength;Mobility;Edema    Rehab Potential Good    Clinical Decision Making  Limited treatment options, no task modification necessary    Comorbidities Affecting Occupational Performance: May have comorbidities impacting occupational performance    Modification or Assistance to Complete Evaluation  Min-Moderate modification of tasks or assist with assess necessary to complete eval    OT Frequency 2x / week    OT Duration 12 weeks   plus eval, anticipate d/c after 8-10 weeks dependent upon progress   OT Treatment/Interventions Self-care/ADL training;Ultrasound;Patient/family education;Scar mobilization;DME and/or AE instruction;Paraffin;Passive range of motion;Fluidtherapy;Cryotherapy;Functional Mobility Training;Splinting;Therapeutic activities;Manual Therapy;Moist Heat;Neuromuscular education    Plan splint check and modifications, no ROM until after next MD appointment    Consulted and Agree with Plan of Care Patient;Family member/caregiver    Family Member Consulted spouse             Patient will benefit from skilled therapeutic intervention in order to improve the following deficits and impairments:   Body Structure / Function / Physical Skills: ADL, UE functional use, Endurance, Balance, Flexibility, Pain, FMC, ROM, Coordination, GMC, Decreased knowledge of precautions, Sensation, Scar mobility, Decreased knowledge of use of DME, IADL, Skin integrity, Dexterity, Strength, Mobility, Edema       Visit Diagnosis: Pain in right wrist  Other lack of coordination  Localized edema  Stiffness of right wrist, not elsewhere classified  Muscle weakness (generalized)  Other abnormalities of gait and mobility    Problem List Patient Active Problem List   Diagnosis Date Noted   Fall 07/09/2021    Jenniefer Salak, OT/L 08/13/2021, 7:53 AM  [place] [facility] [address] [place], [place], [address] Phone: [phone]   Fax:  [phone]  Name: Craig Church MRN: [id number] Date of Birth: November 09, 1965

## 2021-08-25 ENCOUNTER — Ambulatory Visit (INDEPENDENT_AMBULATORY_CARE_PROVIDER_SITE_OTHER): Payer: Worker's Compensation | Admitting: Surgical

## 2021-08-25 ENCOUNTER — Other Ambulatory Visit: Payer: Self-pay

## 2021-08-25 ENCOUNTER — Encounter: Payer: Self-pay | Admitting: Surgical

## 2021-08-25 DIAGNOSIS — S52501A Unspecified fracture of the lower end of right radius, initial encounter for closed fracture: Secondary | ICD-10-CM

## 2021-08-25 NOTE — Progress Notes (Signed)
Patient is a 55 year old male here for follow-up from open reduction internal fixation of his distal radius fracture with Dr. Arita Miss on 07/12/2021.  He is just over 6 weeks postop.  He has been doing some therapy, but this is just started and based on EMR he has only had 2 visits.  He did get an x-ray on 07/28/2021 which showed improved alignment and healing.  His partner is requesting an additional x-ray today.  Chaperone present on exam On exam right wrist incision is intact and healing well.  He has some residual swelling, but this is as expected.  On exam he has sensation intact to all of his fingers.  Overall normal flexion and extension of all fingers and his thumb.  Some slightly decreased flexion noted due to swelling.  Additional x-ray ordered to evaluate alignment.  He can continue with OT for assistance with improvement range of motion and strengthening.  I discussed with the patient that it can take many months and up to 1 year for range of motion and strength to improve, also discussed that range of motion and strength may never return to baseline.  Patient seemed understanding of this.  Recommend calling with questions or concerns.  Picture was taken and placed in the patient's chart with patient's permission.  Recommend following up in 6 weeks for reevaluation.

## 2021-08-26 ENCOUNTER — Encounter: Payer: 59 | Admitting: Occupational Therapy

## 2021-08-26 ENCOUNTER — Ambulatory Visit: Payer: Worker's Compensation | Admitting: Occupational Therapy

## 2021-08-31 ENCOUNTER — Ambulatory Visit: Payer: Worker's Compensation | Admitting: Occupational Therapy

## 2021-09-02 ENCOUNTER — Other Ambulatory Visit: Payer: Self-pay

## 2021-09-02 ENCOUNTER — Ambulatory Visit: Payer: Worker's Compensation | Attending: Family Medicine | Admitting: Occupational Therapy

## 2021-09-02 DIAGNOSIS — R600 Localized edema: Secondary | ICD-10-CM | POA: Diagnosis present

## 2021-09-02 DIAGNOSIS — R278 Other lack of coordination: Secondary | ICD-10-CM | POA: Diagnosis present

## 2021-09-02 DIAGNOSIS — M25531 Pain in right wrist: Secondary | ICD-10-CM | POA: Diagnosis present

## 2021-09-02 DIAGNOSIS — M25631 Stiffness of right wrist, not elsewhere classified: Secondary | ICD-10-CM | POA: Diagnosis present

## 2021-09-02 DIAGNOSIS — M6281 Muscle weakness (generalized): Secondary | ICD-10-CM | POA: Insufficient documentation

## 2021-09-02 NOTE — Therapy (Signed)
[facility] [address] [place], [place], [address] Phone: [phone]   Fax:  [phone]  Occupational Therapy Treatment  Patient Details  Name: Craig Church MRN: [id number] Date of Birth: Jan 22, 1966 Referring Provider (OT): Altha Harm PA-C   Encounter Date: 09/02/2021   OT End of Session - 09/02/21 1052     Visit Number 3    Number of Visits 25    Date for OT Re-Evaluation 10/27/21    Authorization Type workers comp    OT Start Time 1015    OT Stop Time 1100    OT Time Calculation (min) 45 min    Activity Tolerance Patient tolerated treatment well    Behavior During Therapy WFL for tasks assessed/performed             Past Medical History:  Diagnosis Date   Hypertension    Seizure disorder (HCC)     Past Surgical History:  Procedure Laterality Date   CHOLECYSTECTOMY     OPEN REDUCTION INTERNAL FIXATION (ORIF) DISTAL PHALANX Right 07/12/2021   Procedure: OPEN REDUCTION INTERNAL FIXATION (ORIF) DISTAL RADIUS;  Surgeon: Allena Napoleon, MD;  Location: [facility] OR;  Service: Plastics;  Laterality: Right;    There were no vitals filed for this visit.   Subjective Assessment - 09/02/21 1026     Subjective  Pt reports significant pain    Pertinent History 55 yo male with onset of fall at work off a ladder was brought to hosp on 9/23 and received ORIF for R distal radial fracture on 9/26.  Mildly displaced R ulnar styloid fracture not requiring sx.  Received TLSO for spinal injuries of T12 comp fx, T11 anterior wedging, B inferior articular process and spinous process fractures.  PMHx:  HA, colloid cyst of brain, idiopathic intracranial hypertension, LE edema, lumbar spondylosis and radiculopathy, chestpain, HLD, seizure disorder.    Limitations cleared for ROM and strengthening per MD, back brace on, NWB through wrist    Patient Stated Goals custom splint    Currently in Pain? Yes    Pain Score 6     Pain  Location Wrist    Pain Orientation Right    Pain Descriptors / Indicators Sharp;Jabbing    Pain Type Surgical pain    Pain Onset More than a month ago    Pain Frequency Intermittent    Aggravating Factors  movement    Pain Relieving Factors rest, meds            AROM as follows:  Rt wrist flex = 30*, ext = 30*, RD = 8*, UD = 20*, Sup = 65*  Discussed weaning from splint while in home and beginning to use Rt hand for light tasks/ADLS  Pt issued extensive HEP - see pt instructions for details. Pt performed each as indicated. Forearm gym x 2 revolutions.                       OT Education - 09/02/21 1044     Education Details A/ROM and P/ROM HEP for fingers, A/ROM HEP for wrist and forearm    Person(s) Educated Patient    Methods Explanation;Demonstration;Verbal cues;Handout    Comprehension Verbalized understanding;Returned demonstration;Verbal cues required              OT Short Term Goals - 09/02/21 1052       OT SHORT TERM GOAL #1   Title I with splint wear, care and precautions  Time 6    Period Weeks    Status Achieved    Target Date 09/15/21      OT SHORT TERM GOAL #2   Title I with inital HEP    Time 6    Period Weeks    Status Achieved      OT SHORT TERM GOAL #3   Title Pt will demonstrate ability to perfrom at least 90% composite finger flexion in prep for functional use.    Time 6    Period Weeks    Status On-going   75-80%     OT SHORT TERM GOAL #4   Title Pt will demonstrate wrist flexion/ extension of at least 40* in prep for functional use.    Time 6    Period Weeks    Status On-going      OT SHORT TERM GOAL #5   Title I with edema control.    Time 6    Period Weeks    Status New               OT Long Term Goals - 08/04/21 1001       OT LONG TERM GOAL #1   Title I with updated HEP.    Time 12    Period Weeks    Status New    Target Date 10/27/21      OT LONG TERM GOAL #2   Title Pt will demonstrate  grip strength of at least 40 lbs in prep for work activities.    Time 12    Period Weeks    Status New      OT LONG TERM GOAL #3   Title Pt will demonstrate wrist flexion/ extension WFLS for work activities.    Time 12    Period Weeks    Status New      OT LONG TERM GOAL #4   Title Pt will demonstrate forearm supination/ pronation of at least 85* in prep for work.    Time 12    Period Weeks    Status New      OT LONG TERM GOAL #5   Title Pt will resume use of RUE at least 90% of the time for ADLS/ IADLS with pain no greater than 3/10.    Time 12    Period Weeks    Status New      Long Term Additional Goals   Additional Long Term Goals Yes      OT LONG TERM GOAL #6   Title Pt will perform simulated work activities modified independently    Time 12    Period Weeks    Status New                   Plan - 09/02/21 1058     Clinical Impression Statement Pt cleared for ROM and strengthening per latest MD note. Pt with mild to moderate swelling Rt hand/distal forearm and c/o numbness ring and small finger    OT Occupational Profile and History Detailed Assessment- Review of Records and additional review of physical, cognitive, psychosocial history related to current functional performance    Occupational performance deficits (Please refer to evaluation for details): ADL's;IADL's;Work;Social Participation    Body Structure / Function / Physical Skills ADL;UE functional use;Endurance;Balance;Flexibility;Pain;FMC;ROM;Coordination;GMC;Decreased knowledge of precautions;Sensation;Scar mobility;Decreased knowledge of use of DME;IADL;Skin integrity;Dexterity;Strength;Mobility;Edema    Rehab Potential Good    Clinical Decision Making Limited treatment options, no task modification necessary    Comorbidities Affecting Occupational  Performance: May have comorbidities impacting occupational performance    Modification or Assistance to Complete Evaluation  Min-Moderate modification of  tasks or assist with assess necessary to complete eval    OT Frequency 2x / week    OT Duration 12 weeks   plus eval, anticipate d/c after 8-10 weeks dependent upon progress   OT Treatment/Interventions Self-care/ADL training;Ultrasound;Patient/family education;Scar mobilization;DME and/or AE instruction;Paraffin;Passive range of motion;Fluidtherapy;Cryotherapy;Functional Mobility Training;Splinting;Therapeutic activities;Manual Therapy;Moist Heat;Neuromuscular education    Plan review previous HEP prn, add P/ROM to wrist, and grip strengthening w/ putty for hand, edema management prn    Consulted and Agree with Plan of Care Patient;Family member/caregiver    Family Member Consulted spouse             Patient will benefit from skilled therapeutic intervention in order to improve the following deficits and impairments:   Body Structure / Function / Physical Skills: ADL, UE functional use, Endurance, Balance, Flexibility, Pain, FMC, ROM, Coordination, GMC, Decreased knowledge of precautions, Sensation, Scar mobility, Decreased knowledge of use of DME, IADL, Skin integrity, Dexterity, Strength, Mobility, Edema       Visit Diagnosis: Pain in right wrist  Stiffness of right wrist, not elsewhere classified  Localized edema    Problem List Patient Active Problem List   Diagnosis Date Noted   Fall 07/09/2021    Kelli Churn, OTR/L 09/02/2021, 12:19 PM  [facility] [address] [place], [place], [address] Phone: [phone]   Fax:  [phone]  Name: Craig Church MRN: [id number] Date of Birth: 1966-06-21

## 2021-09-02 NOTE — Patient Instructions (Signed)
  Flexor Tendon Gliding (Active Hook Fist)   With fingers and knuckles straight, bend middle and tip joints. Do not bend large knuckles. Repeat _10-15___ times. Then push further with other hand. Do _4-6___ sessions per day.  MP Flexion (Active)   Bend large knuckles as far as they will go, keeping fingers straight. Then push further with other hand Repeat _10__ times. Do __4-6__ sessions per day.      Finger Flexion / Extension  Bend fingers of left hand toward palm, making a fist. Then push further with other hand. Straighten fingers, opening fist. Repeat sequence _10___ times per session. Do _4-6__ sessions per day.  Opposition (Active)    Touch tip of thumb to nail tip of small finger in turn, making an "O" shape. Repeat __10__ times. Do _4-6___ sessions per day.  AROM: Thumb Flexion / Extension    Actively bend right thumb across palm as far as possible ("4"). Hold __5__ seconds. Then push further with other hand. Then pull thumb back into hitchhike position ("5"). Repeat _10___ times per set. Do __4-6__ sessions per day. **Then twiddle thumbs in big circles  AROM: Wrist Extension    With right palm down, bend wrist up. Repeat __10__ times per set.  Do _4-6___ sessions per day.  AROM: Wrist Flexion    With right palm up, bend wrist up. Repeat __10__ times per set.  Do _4-6___ sessions per day.  AROM: Wrist Radial / Ulnar Deviation    Gently bend right wrist from side to side as far as possible. Keep forearm still! Repeat __10__ times per set. Do __4-6__ sessions per day.  Combination Movement (Active)    Keep right elbow firmly bent at side with wrist straight. Turn palm slowly up and down. Repeat __10__ times. Do __4-6__ sessions per day.

## 2021-09-07 ENCOUNTER — Encounter: Payer: Self-pay | Admitting: Occupational Therapy

## 2021-09-07 ENCOUNTER — Other Ambulatory Visit: Payer: Self-pay

## 2021-09-07 ENCOUNTER — Ambulatory Visit: Payer: Worker's Compensation | Admitting: Occupational Therapy

## 2021-09-07 DIAGNOSIS — R600 Localized edema: Secondary | ICD-10-CM

## 2021-09-07 DIAGNOSIS — M25531 Pain in right wrist: Secondary | ICD-10-CM | POA: Diagnosis not present

## 2021-09-07 DIAGNOSIS — R278 Other lack of coordination: Secondary | ICD-10-CM

## 2021-09-07 DIAGNOSIS — M6281 Muscle weakness (generalized): Secondary | ICD-10-CM

## 2021-09-07 DIAGNOSIS — M25631 Stiffness of right wrist, not elsewhere classified: Secondary | ICD-10-CM

## 2021-09-07 NOTE — Patient Instructions (Signed)
Use an ice pack for pain and swelling for no longer than 8 mins wrapped in a towel.   1. Grip Strengthening (Resistive Putty)   Squeeze putty using thumb and all fingers. Repeat _20__ times. Do __1_ sessions per day.   2. Roll putty into tube on table and pinch between each finger and thumb x 10 reps each. (can do ring and small finger together)     Copyright  VHI. All rights reserved.

## 2021-09-07 NOTE — Therapy (Signed)
[facility] [address] [place], [place], [address] Phone: [phone]   Fax:  [phone]  Occupational Therapy Treatment  Patient Details  Name: Craig Church MRN: [id number] Date of Birth: January 28, 1966 Referring Provider (OT): Altha Harm PA-C   Encounter Date: 09/07/2021   OT End of Session - 09/07/21 1227     Visit Number 5    Number of Visits 25    Date for OT Re-Evaluation 10/27/21    Authorization Type workers comp    OT Start Time 0934    OT Stop Time 1015    OT Time Calculation (min) 41 min             Past Medical History:  Diagnosis Date   Hypertension    Seizure disorder (HCC)     Past Surgical History:  Procedure Laterality Date   CHOLECYSTECTOMY     OPEN REDUCTION INTERNAL FIXATION (ORIF) DISTAL PHALANX Right 07/12/2021   Procedure: OPEN REDUCTION INTERNAL FIXATION (ORIF) DISTAL RADIUS;  Surgeon: Allena Napoleon, MD;  Location: [facility] OR;  Service: Plastics;  Laterality: Right;    There were no vitals filed for this visit.   Subjective Assessment - 09/07/21 0941     Subjective  Pt reports mild pain    Pertinent History 54 yo male with onset of fall at work off a ladder was brought to hosp on 9/23 and received ORIF for R distal radial fracture on 9/26.  Mildly displaced R ulnar styloid fracture not requiring sx.  Received TLSO for spinal injuries of T12 comp fx, T11 anterior wedging, B inferior articular process and spinous process fractures.  PMHx:  HA, colloid cyst of brain, idiopathic intracranial hypertension, LE edema, lumbar spondylosis and radiculopathy, chestpain, HLD, seizure disorder.    Limitations cleared for ROM and strengthening per MD, back brace on, NWB through wrist    Patient Stated Goals custom splint    Currently in Pain? Yes    Pain Score 4     Pain Location Wrist    Pain Orientation Right    Pain Descriptors / Indicators Aching    Pain Type Surgical pain    Pain Onset  More than a month ago    Pain Frequency Intermittent    Aggravating Factors  movement    Pain Relieving Factors rest    Multiple Pain Sites No                           Treatment: Fluidotherapy x 9 mins to RUE for pain and stiffness, with pt performing A/ROM while in fluido. No adverse reactions Retrograde massage to right hand due to moderate edema A/ROM wrist flexion/ extension followed by gentle self P/ROM wrist flex/ ext Ice pack to right hand x 5 mins for pain relief and edema control.         OT Education - 09/07/21 1226     Education Details Reviewed A/ROM wrist flexion/ extension and tendon gliding exercises, added yellow putty- see pt instuctions, education preformed regarding retrograde massage, edema issued , pt was educated about wear, care and precautions and use of ice for edema control    Person(s) Educated Patient    Methods Explanation;Demonstration;Verbal cues;Handout    Comprehension Verbalized understanding;Returned demonstration;Verbal cues required              OT Short Term Goals - 09/02/21 1052       OT SHORT TERM GOAL #1  Title I with splint wear, care and precautions    Time 6    Period Weeks    Status Achieved    Target Date 09/15/21      OT SHORT TERM GOAL #2   Title I with inital HEP    Time 6    Period Weeks    Status Achieved      OT SHORT TERM GOAL #3   Title Pt will demonstrate ability to perfrom at least 90% composite finger flexion in prep for functional use.    Time 6    Period Weeks    Status On-going   75-80%     OT SHORT TERM GOAL #4   Title Pt will demonstrate wrist flexion/ extension of at least 40* in prep for functional use.    Time 6    Period Weeks    Status On-going      OT SHORT TERM GOAL #5   Title I with edema control.    Time 6    Period Weeks    Status New               OT Long Term Goals - 08/04/21 1001       OT LONG TERM GOAL #1   Title I with updated HEP.    Time 12     Period Weeks    Status New    Target Date 10/27/21      OT LONG TERM GOAL #2   Title Pt will demonstrate grip strength of at least 40 lbs in prep for work activities.    Time 12    Period Weeks    Status New      OT LONG TERM GOAL #3   Title Pt will demonstrate wrist flexion/ extension WFLS for work activities.    Time 12    Period Weeks    Status New      OT LONG TERM GOAL #4   Title Pt will demonstrate forearm supination/ pronation of at least 85* in prep for work.    Time 12    Period Weeks    Status New      OT LONG TERM GOAL #5   Title Pt will resume use of RUE at least 90% of the time for ADLS/ IADLS with pain no greater than 3/10.    Time 12    Period Weeks    Status New      Long Term Additional Goals   Additional Long Term Goals Yes      OT LONG TERM GOAL #6   Title Pt will perform simulated work activities modified independently    Time 12    Period Weeks    Status New                   Plan - 09/07/21 1228     Clinical Impression Statement Pt is progressing towards goals however he is limited by pain and stiffness. Edema glove was issued today.    OT Occupational Profile and History Detailed Assessment- Review of Records and additional review of physical, cognitive, psychosocial history related to current functional performance    Occupational performance deficits (Please refer to evaluation for details): ADL's;IADL's;Work;Social Participation    Body Structure / Function / Physical Skills ADL;UE functional use;Endurance;Balance;Flexibility;Pain;FMC;ROM;Coordination;GMC;Decreased knowledge of precautions;Sensation;Scar mobility;Decreased knowledge of use of DME;IADL;Skin integrity;Dexterity;Strength;Mobility;Edema    Rehab Potential Good    Clinical Decision Making Limited treatment options, no task modification necessary  Comorbidities Affecting Occupational Performance: May have comorbidities impacting occupational performance    Modification  or Assistance to Complete Evaluation  Min-Moderate modification of tasks or assist with assess necessary to complete eval    OT Frequency 2x / week    OT Duration 12 weeks   plus eval, anticipate d/c after 8-10 weeks dependent upon progress   OT Treatment/Interventions Self-care/ADL training;Ultrasound;Patient/family education;Scar mobilization;DME and/or AE instruction;Paraffin;Passive range of motion;Fluidtherapy;Cryotherapy;Functional Mobility Training;Splinting;Therapeutic activities;Manual Therapy;Moist Heat;Neuromuscular education    Plan continue A/ROM exercises for wrist/ hand, issued gentle P/ROM wrist flexion and extension, edema control techniques    Consulted and Agree with Plan of Care Patient             Patient will benefit from skilled therapeutic intervention in order to improve the following deficits and impairments:   Body Structure / Function / Physical Skills: ADL, UE functional use, Endurance, Balance, Flexibility, Pain, FMC, ROM, Coordination, GMC, Decreased knowledge of precautions, Sensation, Scar mobility, Decreased knowledge of use of DME, IADL, Skin integrity, Dexterity, Strength, Mobility, Edema       Visit Diagnosis: Pain in right wrist  Stiffness of right wrist, not elsewhere classified  Localized edema  Other lack of coordination  Muscle weakness (generalized)    Problem List Patient Active Problem List   Diagnosis Date Noted   Fall 07/09/2021    [place] Vierling, OT/L 09/07/2021, 2:41 PM  [place] [facility] [address] [place], [place], [address] Phone: [phone]   Fax:  [phone]  Name: Craig Church MRN: [id number] Date of Birth: [birth year]

## 2021-09-08 ENCOUNTER — Ambulatory Visit: Payer: Worker's Compensation | Admitting: Occupational Therapy

## 2021-09-08 ENCOUNTER — Encounter: Payer: 59 | Admitting: Occupational Therapy

## 2021-09-14 ENCOUNTER — Ambulatory Visit: Payer: Worker's Compensation | Admitting: Occupational Therapy

## 2021-09-14 ENCOUNTER — Other Ambulatory Visit: Payer: Self-pay

## 2021-09-14 ENCOUNTER — Encounter: Payer: Self-pay | Admitting: Occupational Therapy

## 2021-09-14 DIAGNOSIS — R600 Localized edema: Secondary | ICD-10-CM

## 2021-09-14 DIAGNOSIS — M25631 Stiffness of right wrist, not elsewhere classified: Secondary | ICD-10-CM

## 2021-09-14 DIAGNOSIS — M25531 Pain in right wrist: Secondary | ICD-10-CM

## 2021-09-14 DIAGNOSIS — M6281 Muscle weakness (generalized): Secondary | ICD-10-CM

## 2021-09-14 DIAGNOSIS — R278 Other lack of coordination: Secondary | ICD-10-CM

## 2021-09-14 NOTE — Patient Instructions (Signed)
PROM: Wrist Flexion / Extension   Grasp  hand and slowly bend wrist until stretch is felt. Relax. Then stretch as far as possible in opposite direction. Be sure to keep elbow bent.  Hold __10__ sec. each way Repeat _10__ times per set.    Do _4_ sessions per day.  Pronation (Passive)   Keep elbow bent at right angle and held firmly to side. Use other hand to turn forearm until palm faces downward. Hold _10___ seconds. Repeat __10_ times. Do _4__ sessions per day.  Supination (Passive)   Keep elbow bent at right angle and held firmly at side. Use other hand to turn forearm until palm faces upward. Hold __10__ seconds. Repeat __10_ times. Do _4__ sessions per day.  Copyright  VHI. All rights reserved.

## 2021-09-14 NOTE — Therapy (Signed)
[facility] [address] [place], [place], [address] Phone: [phone]   Fax:  [phone]  Occupational Therapy Treatment  Patient Details  Name: Craig Church MRN: [id number] Date of Birth: [birth year] Referring Provider (OT): Altha Harm PA-C   Encounter Date: 09/14/2021   OT End of Session - 09/14/21 1543     Visit Number 5   corrected count   Number of Visits 25    Authorization - Visit Number 5    Authorization - Number of Visits 16   authorized visits   OT Start Time 1450    OT Stop Time 1530    OT Time Calculation (min) 40 min    Activity Tolerance Patient tolerated treatment well    Behavior During Therapy [facility] for tasks assessed/performed             Past Medical History:  Diagnosis Date   Hypertension    Seizure disorder [facility])     Past Surgical History:  Procedure Laterality Date   CHOLECYSTECTOMY     OPEN REDUCTION INTERNAL FIXATION (ORIF) DISTAL PHALANX Right 07/12/2021   Procedure: OPEN REDUCTION INTERNAL FIXATION (ORIF) DISTAL RADIUS;  Surgeon: Allena Napoleon, MD;  Location: [facility] OR;  Service: Plastics;  Laterality: Right;    There were no vitals filed for this visit.   Subjective Assessment - 09/14/21 1501     Pertinent History 55 yo male with onset of fall at work off a ladder was brought to hosp on 9/23 and received ORIF for R distal radial fracture on 9/26.  Mildly displaced R ulnar styloid fracture not requiring sx.  Received TLSO for spinal injuries of T12 comp fx, T11 anterior wedging, B inferior articular process and spinous process fractures.  PMHx:  HA, colloid cyst of brain, idiopathic intracranial hypertension, LE edema, lumbar spondylosis and radiculopathy, chestpain, HLD, seizure disorder.    Limitations cleared for ROM and strengthening per MD, back brace on, NWB through wrist    Patient Stated Goals custom splint    Currently in Pain? Yes    Pain Score 6     Pain Location  Wrist    Pain Orientation Right    Pain Descriptors / Indicators Aching                          Treatment: Hotpack to right wrist and hand x 7 mins for pain relief. No adverse reactions. Retrograde massage to hand and digits for edema control. [place] , 0.8 w/cm2 , 20% x 8 mins for edema control, no adverse reactions A/ROM and P/ROM  to wrist and digits. Edema glove reapplied at  end of session.        OT Education - 09/14/21 1544     Education Details Reviewed A/ROM wrist flexion/ extension and tendon gliding exercises for A/ROM and passive stretch of digits, handout issued for passive wrist and forearm exercises. Pt returned demonstration.    Person(s) Educated Patient    Methods Explanation;Demonstration;Verbal cues;Handout    Comprehension Verbalized understanding;Returned demonstration;Verbal cues required              OT Short Term Goals - 09/02/21 1052       OT SHORT TERM GOAL #1   Title I with splint wear, care and precautions    Time 6    Period Weeks    Status Achieved    Target Date 09/15/21      OT SHORT TERM GOAL #2  Title I with inital HEP    Time 6    Period Weeks    Status Achieved      OT SHORT TERM GOAL #3   Title Pt will demonstrate ability to perfrom at least 90% composite finger flexion in prep for functional use.    Time 6    Period Weeks    Status On-going   75-80%     OT SHORT TERM GOAL #4   Title Pt will demonstrate wrist flexion/ extension of at least 40* in prep for functional use.    Time 6    Period Weeks    Status On-going      OT SHORT TERM GOAL #5   Title I with edema control.    Time 6    Period Weeks    Status New               OT Long Term Goals - 08/04/21 1001       OT LONG TERM GOAL #1   Title I with updated HEP.    Time 12    Period Weeks    Status New    Target Date 10/27/21      OT LONG TERM GOAL #2   Title Pt will demonstrate grip strength of at least 40 lbs in prep for work  activities.    Time 12    Period Weeks    Status New      OT LONG TERM GOAL #3   Title Pt will demonstrate wrist flexion/ extension WFLS for work activities.    Time 12    Period Weeks    Status New      OT LONG TERM GOAL #4   Title Pt will demonstrate forearm supination/ pronation of at least 85* in prep for work.    Time 12    Period Weeks    Status New      OT LONG TERM GOAL #5   Title Pt will resume use of RUE at least 90% of the time for ADLS/ IADLS with pain no greater than 3/10.    Time 12    Period Weeks    Status New      Long Term Additional Goals   Additional Long Term Goals Yes      OT LONG TERM GOAL #6   Title Pt will perform simulated work activities modified independently    Time 12    Period Weeks    Status New                   Plan - 09/14/21 1543     Clinical Impression Statement Pt is progressing towards goals slowly limited by pain and edema.    OT Occupational Profile and History Detailed Assessment- Review of Records and additional review of physical, cognitive, psychosocial history related to current functional performance    Occupational performance deficits (Please refer to evaluation for details): ADL's;IADL's;Work;Social Participation    Body Structure / Function / Physical Skills ADL;UE functional use;Endurance;Balance;Flexibility;Pain;FMC;ROM;Coordination;GMC;Decreased knowledge of precautions;Sensation;Scar mobility;Decreased knowledge of use of DME;IADL;Skin integrity;Dexterity;Strength;Mobility;Edema    Rehab Potential Good    Clinical Decision Making Limited treatment options, no task modification necessary    Comorbidities Affecting Occupational Performance: May have comorbidities impacting occupational performance    Modification or Assistance to Complete Evaluation  Min-Moderate modification of tasks or assist with assess necessary to complete eval    OT Frequency 2x / week    OT Duration 12 weeks  plus eval, anticipate d/c  after 8-10 weeks dependent upon progress   OT Treatment/Interventions Self-care/ADL training;Ultrasound;Patient/family education;Scar mobilization;DME and/or AE instruction;Paraffin;Passive range of motion;Fluidtherapy;Cryotherapy;Functional Mobility Training;Splinting;Therapeutic activities;Manual Therapy;Moist Heat;Neuromuscular education    Plan continue A/ROM , P/ROM wrist flexion and extension, yellow putty, edema control techniques    Consulted and Agree with Plan of Care Patient             Patient will benefit from skilled therapeutic intervention in order to improve the following deficits and impairments:   Body Structure / Function / Physical Skills: ADL, UE functional use, Endurance, Balance, Flexibility, Pain, FMC, ROM, Coordination, GMC, Decreased knowledge of precautions, Sensation, Scar mobility, Decreased knowledge of use of DME, IADL, Skin integrity, Dexterity, Strength, Mobility, Edema       Visit Diagnosis: Stiffness of right wrist, not elsewhere classified  Pain in right wrist  Localized edema  Other lack of coordination  Muscle weakness (generalized)    Problem List Patient Active Problem List   Diagnosis Date Noted   Fall 07/09/2021    Kaydenn Mclear, OT/L 09/14/2021, 3:55 PM  [place] [facility] [address] [place], [place], [address] Phone: [phone]   Fax:  [phone]  Name: Craig Church MRN: [id number] Date of Birth: Aug 04, 1966

## 2021-09-16 ENCOUNTER — Other Ambulatory Visit: Payer: Self-pay

## 2021-09-16 ENCOUNTER — Ambulatory Visit: Payer: Worker's Compensation | Attending: Family Medicine | Admitting: Occupational Therapy

## 2021-09-16 DIAGNOSIS — M25631 Stiffness of right wrist, not elsewhere classified: Secondary | ICD-10-CM | POA: Insufficient documentation

## 2021-09-16 DIAGNOSIS — R600 Localized edema: Secondary | ICD-10-CM | POA: Insufficient documentation

## 2021-09-16 DIAGNOSIS — M6281 Muscle weakness (generalized): Secondary | ICD-10-CM

## 2021-09-16 DIAGNOSIS — M25531 Pain in right wrist: Secondary | ICD-10-CM | POA: Insufficient documentation

## 2021-09-16 DIAGNOSIS — R278 Other lack of coordination: Secondary | ICD-10-CM | POA: Diagnosis present

## 2021-09-16 NOTE — Therapy (Signed)
[facility] [address] [place], [place], [address] Phone: [phone]   Fax:  [phone]  Occupational Therapy Treatment  Patient Details  Name: Craig Church MRN: [id number] Date of Birth: 05-26-1966 Referring Provider (OT): Altha Harm PA-C   Encounter Date: 09/16/2021   OT End of Session - 09/16/21 1525     Visit Number 6    Number of Visits 25    Authorization - Visit Number 6    Authorization - Number of Visits 16   authorized visits   OT Start Time 1449    OT Stop Time 1530    OT Time Calculation (min) 41 min    Activity Tolerance Patient tolerated treatment well    Behavior During Therapy [facility] for tasks assessed/performed             Past Medical History:  Diagnosis Date   Hypertension    Seizure disorder [facility])     Past Surgical History:  Procedure Laterality Date   CHOLECYSTECTOMY     OPEN REDUCTION INTERNAL FIXATION (ORIF) DISTAL PHALANX Right 07/12/2021   Procedure: OPEN REDUCTION INTERNAL FIXATION (ORIF) DISTAL RADIUS;  Surgeon: Allena Napoleon, MD;  Location: [facility] OR;  Service: Plastics;  Laterality: Right;    There were no vitals filed for this visit.   Subjective Assessment - 09/16/21 1527     Subjective  Pt reports swelling is better    Pertinent History 55 yo male with onset of fall at work off a ladder was brought to hosp on 9/23 and received ORIF for R distal radial fracture on 9/26.  Mildly displaced R ulnar styloid fracture not requiring sx.  Received TLSO for spinal injuries of T12 comp fx, T11 anterior wedging, B inferior articular process and spinous process fractures.  PMHx:  HA, colloid cyst of brain, idiopathic intracranial hypertension, LE edema, lumbar spondylosis and radiculopathy, chestpain, HLD, seizure disorder.    Limitations cleared for ROM and strengthening per MD, back brace on, NWB through wrist    Patient Stated Goals custom splint    Currently in Pain? Yes    Pain  Score 6     Pain Location Hand   wrist   Pain Orientation Right    Pain Descriptors / Indicators Aching    Pain Type Surgical pain    Pain Onset More than a month ago    Pain Frequency Intermittent    Aggravating Factors  movement    Pain Relieving Factors rest    Multiple Pain Sites No                   Treatment:US , 0.8 w/cm2 , 20% x 8 mins to palm and digits and dorsal hand for edema control, no adverse reactions A/ROM and P/ROM to digits in composite flexion, MP flexion(individual and composite), hook fist, A/ROM and P/ROM wrist flexion and extension, supination/ pronation Edema glove and tensogrip reapplied at  end of session.               OT Education - 09/16/21 1535     Education Details yellow putty exercise review    Person(s) Educated Patient    Methods Explanation;Demonstration;Verbal cues;Handout    Comprehension Verbalized understanding;Returned demonstration;Verbal cues required              OT Short Term Goals - 09/16/21 1518       OT SHORT TERM GOAL #1   Title I with splint wear, care and precautions  Time 6    Period Weeks    Status Achieved    Target Date 09/15/21      OT SHORT TERM GOAL #2   Title I with inital HEP    Time 6    Period Weeks    Status Achieved      OT SHORT TERM GOAL #3   Title Pt will demonstrate ability to perfrom at least 90% composite finger flexion in prep for functional use.    Time 6    Period Weeks    Status On-going   75-80%     OT SHORT TERM GOAL #4   Title Pt will demonstrate wrist flexion/ extension of at least 40* in prep for functional use.    Time 6    Period Weeks    Status On-going   40 for flexion, 25 for extension     OT SHORT TERM GOAL #5   Title I with edema control.    Time 6    Period Weeks    Status New               OT Long Term Goals - 08/04/21 1001       OT LONG TERM GOAL #1   Title I with updated HEP.    Time 12    Period Weeks    Status New     Target Date 10/27/21      OT LONG TERM GOAL #2   Title Pt will demonstrate grip strength of at least 40 lbs in prep for work activities.    Time 12    Period Weeks    Status New      OT LONG TERM GOAL #3   Title Pt will demonstrate wrist flexion/ extension WFLS for work activities.    Time 12    Period Weeks    Status New      OT LONG TERM GOAL #4   Title Pt will demonstrate forearm supination/ pronation of at least 85* in prep for work.    Time 12    Period Weeks    Status New      OT LONG TERM GOAL #5   Title Pt will resume use of RUE at least 90% of the time for ADLS/ IADLS with pain no greater than 3/10.    Time 12    Period Weeks    Status New      Long Term Additional Goals   Additional Long Term Goals Yes      OT LONG TERM GOAL #6   Title Pt will perform simulated work activities modified independently    Time 12    Period Weeks    Status New                   Plan - 09/16/21 1516     Clinical Impression Statement Pt is progressing towards goals slowly limited by pain and edema. Pt does demonstrate decreased edema and increasing ROM today.    OT Occupational Profile and History Detailed Assessment- Review of Records and additional review of physical, cognitive, psychosocial history related to current functional performance    Occupational performance deficits (Please refer to evaluation for details): ADL's;IADL's;Work;Social Participation    Body Structure / Function / Physical Skills ADL;UE functional use;Endurance;Balance;Flexibility;Pain;FMC;ROM;Coordination;GMC;Decreased knowledge of precautions;Sensation;Scar mobility;Decreased knowledge of use of DME;IADL;Skin integrity;Dexterity;Strength;Mobility;Edema    Rehab Potential Good    Clinical Decision Making Limited treatment options, no task modification necessary    Comorbidities Affecting  Occupational Performance: May have comorbidities impacting occupational performance    Modification or Assistance  to Complete Evaluation  Min-Moderate modification of tasks or assist with assess necessary to complete eval    OT Frequency 2x / week    OT Duration 12 weeks   plus eval, anticipate d/c after 8-10 weeks dependent upon progress   OT Treatment/Interventions Self-care/ADL training;Ultrasound;Patient/family education;Scar mobilization;DME and/or AE instruction;Paraffin;Passive range of motion;Fluidtherapy;Cryotherapy;Functional Mobility Training;Splinting;Therapeutic activities;Manual Therapy;Moist Heat;Neuromuscular education    Plan continue [place] to palm and dorsal hand,  A/ROM , P/ROM wrist flexion and extension, yellow putty, edema control techniques    Consulted and Agree with Plan of Care Patient             Patient will benefit from skilled therapeutic intervention in order to improve the following deficits and impairments:   Body Structure / Function / Physical Skills: ADL, UE functional use, Endurance, Balance, Flexibility, Pain, FMC, ROM, Coordination, GMC, Decreased knowledge of precautions, Sensation, Scar mobility, Decreased knowledge of use of DME, IADL, Skin integrity, Dexterity, Strength, Mobility, Edema       Visit Diagnosis: Stiffness of right wrist, not elsewhere classified  Pain in right wrist  Localized edema  Other lack of coordination  Muscle weakness (generalized)    Problem List Patient Active Problem List   Diagnosis Date Noted   Fall 07/09/2021    Raigan Baria, OT/L 09/16/2021, 3:36 PM  [facility] [address] [place], [place], [address] Phone: [phone]   Fax:  [phone]  Name: Gemini Beaumier MRN: [id number] Date of Birth: [birth year]

## 2021-09-21 ENCOUNTER — Encounter: Payer: Self-pay | Admitting: Occupational Therapy

## 2021-09-21 ENCOUNTER — Other Ambulatory Visit: Payer: Self-pay

## 2021-09-21 ENCOUNTER — Ambulatory Visit: Payer: Worker's Compensation | Admitting: Occupational Therapy

## 2021-09-21 DIAGNOSIS — R600 Localized edema: Secondary | ICD-10-CM

## 2021-09-21 DIAGNOSIS — M25531 Pain in right wrist: Secondary | ICD-10-CM

## 2021-09-21 DIAGNOSIS — M25631 Stiffness of right wrist, not elsewhere classified: Secondary | ICD-10-CM

## 2021-09-21 DIAGNOSIS — R278 Other lack of coordination: Secondary | ICD-10-CM

## 2021-09-21 DIAGNOSIS — M6281 Muscle weakness (generalized): Secondary | ICD-10-CM

## 2021-09-21 NOTE — Therapy (Signed)
[facility] [address] [place], [place], [address] Phone: [phone]   Fax:  [phone]  Occupational Therapy Treatment  Patient Details  Name: Craig Church MRN: [id number] Date of Birth: 08/06/1966 Referring Provider (OT): Altha Harm PA-C   Encounter Date: 09/21/2021   OT End of Session - 09/21/21 1612     Visit Number 7    Number of Visits 25    Date for OT Re-Evaluation 10/27/21    Authorization Type workers comp    Authorization - Visit Number 7    Authorization - Number of Visits 16    OT Start Time 1230    OT Stop Time 1315    OT Time Calculation (min) 45 min    Activity Tolerance Patient tolerated treatment well    Behavior During Therapy WFL for tasks assessed/performed             Past Medical History:  Diagnosis Date   Hypertension    Seizure disorder (HCC)     Past Surgical History:  Procedure Laterality Date   CHOLECYSTECTOMY     OPEN REDUCTION INTERNAL FIXATION (ORIF) DISTAL PHALANX Right 07/12/2021   Procedure: OPEN REDUCTION INTERNAL FIXATION (ORIF) DISTAL RADIUS;  Surgeon: Allena Napoleon, MD;  Location: [facility] OR;  Service: Plastics;  Laterality: Right;    There were no vitals filed for this visit.   Subjective Assessment - 09/21/21 1610     Subjective  Patient reports intermittent swelling, pain staying consistent    Pertinent History 55 yo male with onset of fall at work off a ladder was brought to hosp on 9/23 and received ORIF for R distal radial fracture on 9/26.  Mildly displaced R ulnar styloid fracture not requiring sx.  Received TLSO for spinal injuries of T12 comp fx, T11 anterior wedging, B inferior articular process and spinous process fractures.  PMHx:  HA, colloid cyst of brain, idiopathic intracranial hypertension, LE edema, lumbar spondylosis and radiculopathy, chestpain, HLD, seizure disorder.    Limitations cleared for ROM and strengthening per MD, back brace on, NWB  through wrist    Currently in Pain? Yes    Pain Score 5     Pain Location Hand    Pain Orientation Right    Pain Descriptors / Indicators Aching    Pain Type Surgical pain    Pain Onset More than a month ago    Pain Frequency Intermittent    Aggravating Factors  attempts to use hand - movement    Pain Relieving Factors rest                          OT Treatments/Exercises (OP) - 09/21/21 0001       Exercises   Exercises Wrist;Hand      Wrist Exercises   Other wrist exercises Reviewed wrist exercises at edge of table to promote increased flexion    Other wrist exercises Patient limited with radial deviation - limited by pain.  Did better with less activation of thumb (abduction)      Hand Exercises   Other Hand Exercises Reviewed hand exercises - patient reports completing frequently during the day.      Modalities   Modalities Ultrasound      Ultrasound   Ultrasound Location palm, digits, dorsum of hand    Ultrasound Parameters , 20%, 0.8w/cm2 x 8 min    Ultrasound Goals Edema;Pain      Manual Therapy   Manual  Therapy Passive ROM    Manual therapy comments Working within pain tolerance - using active relaxation to help gain range of motion and decrease guarding.    Passive ROM Digits, wrist for improved active range of motion, edema management.                      OT Short Term Goals - 09/21/21 1615       OT SHORT TERM GOAL #1   Title I with splint wear, care and precautions    Time 6    Period Weeks    Status Achieved    Target Date 09/15/21      OT SHORT TERM GOAL #2   Title I with inital HEP    Time 6    Period Weeks    Status Achieved      OT SHORT TERM GOAL #3   Title Pt will demonstrate ability to perfrom at least 90% composite finger flexion in prep for functional use.    Time 6    Period Weeks    Status On-going   75-80%     OT SHORT TERM GOAL #4   Title Pt will demonstrate wrist flexion/ extension of at least  40* in prep for functional use.    Time 6    Period Weeks    Status On-going   40 for flexion, 25 for extension     OT SHORT TERM GOAL #5   Title I with edema control.    Time 6    Period Weeks    Status New               OT Long Term Goals - 09/21/21 1615       OT LONG TERM GOAL #1   Title I with updated HEP.    Time 12    Period Weeks    Status New    Target Date 10/27/21      OT LONG TERM GOAL #2   Title Pt will demonstrate grip strength of at least 40 lbs in prep for work activities.    Time 12    Period Weeks    Status New      OT LONG TERM GOAL #3   Title Pt will demonstrate wrist flexion/ extension WFLS for work activities.    Time 12    Period Weeks    Status New      OT LONG TERM GOAL #4   Title Pt will demonstrate forearm supination/ pronation of at least 85* in prep for work.    Time 12    Period Weeks    Status New      OT LONG TERM GOAL #5   Title Pt will resume use of RUE at least 90% of the time for ADLS/ IADLS with pain no greater than 3/10.    Time 12    Period Weeks    Status New      OT LONG TERM GOAL #6   Title Pt will perform simulated work activities modified independently    Time 12    Period Weeks    Status New                   Plan - 09/21/21 1615     Clinical Impression Statement Pt is progressing towards goals slowly limited by pain and edema. Patient reports routine exercise and edema management throughout the day.    OT Occupational Profile and History Detailed  Assessment- Review of Records and additional review of physical, cognitive, psychosocial history related to current functional performance    Occupational performance deficits (Please refer to evaluation for details): ADL's;IADL's;Work;Social Participation    Body Structure / Function / Physical Skills ADL;UE functional use;Endurance;Balance;Flexibility;Pain;FMC;ROM;Coordination;GMC;Decreased knowledge of precautions;Sensation;Scar mobility;Decreased  knowledge of use of DME;IADL;Skin integrity;Dexterity;Strength;Mobility;Edema    Rehab Potential Good    Clinical Decision Making Limited treatment options, no task modification necessary    Comorbidities Affecting Occupational Performance: May have comorbidities impacting occupational performance    Modification or Assistance to Complete Evaluation  Min-Moderate modification of tasks or assist with assess necessary to complete eval    OT Frequency 2x / week    OT Duration 12 weeks   plus eval, anticipate d/c after 8-10 weeks dependent upon progress   OT Treatment/Interventions Self-care/ADL training;Ultrasound;Patient/family education;Scar mobilization;DME and/or AE instruction;Paraffin;Passive range of motion;Fluidtherapy;Cryotherapy;Functional Mobility Training;Splinting;Therapeutic activities;Manual Therapy;Moist Heat;Neuromuscular education    Plan continue [place] to palm and dorsal hand,  A/ROM , P/ROM wrist flexion and extension, yellow putty, edema control techniques    Consulted and Agree with Plan of Care Patient             Patient will benefit from skilled therapeutic intervention in order to improve the following deficits and impairments:   Body Structure / Function / Physical Skills: ADL, UE functional use, Endurance, Balance, Flexibility, Pain, FMC, ROM, Coordination, GMC, Decreased knowledge of precautions, Sensation, Scar mobility, Decreased knowledge of use of DME, IADL, Skin integrity, Dexterity, Strength, Mobility, Edema       Visit Diagnosis: Stiffness of right wrist, not elsewhere classified  Pain in right wrist  Localized edema  Other lack of coordination  Muscle weakness (generalized)    Problem List Patient Active Problem List   Diagnosis Date Noted   Fall 07/09/2021    Collier Salina, OT 09/21/2021, 4:17 PM  [facility] [address] [place], [place], [address] Phone: [phone]    Fax:  [phone]  Name: Craig Church MRN: [id number] Date of Birth: 01-08-1966

## 2021-09-23 ENCOUNTER — Ambulatory Visit: Payer: Worker's Compensation | Admitting: Occupational Therapy

## 2021-09-23 ENCOUNTER — Other Ambulatory Visit: Payer: Self-pay

## 2021-09-23 DIAGNOSIS — M6281 Muscle weakness (generalized): Secondary | ICD-10-CM

## 2021-09-23 DIAGNOSIS — M25631 Stiffness of right wrist, not elsewhere classified: Secondary | ICD-10-CM

## 2021-09-23 DIAGNOSIS — R278 Other lack of coordination: Secondary | ICD-10-CM

## 2021-09-23 DIAGNOSIS — R600 Localized edema: Secondary | ICD-10-CM

## 2021-09-23 DIAGNOSIS — M25531 Pain in right wrist: Secondary | ICD-10-CM

## 2021-09-23 NOTE — Therapy (Signed)
[facility] [address] [place], [place], [address] Phone: [phone]   Fax:  [phone]  Occupational Therapy Treatment  Patient Details  Name: Craig Church MRN: [id number] Date of Birth: [birth year] Referring Provider (OT): Altha Harm PA-C   Encounter Date: 09/23/2021   OT End of Session - 09/23/21 1632     Visit Number 8    Number of Visits 25    Date for OT Re-Evaluation 10/27/21    Authorization Type workers comp    Authorization - Visit Number 8    Authorization - Number of Visits 16    OT Start Time 1535    OT Stop Time 1620    OT Time Calculation (min) 45 min    Activity Tolerance Patient tolerated treatment well    Behavior During Therapy [facility] for tasks assessed/performed             Past Medical History:  Diagnosis Date   Hypertension    Seizure disorder (HCC)     Past Surgical History:  Procedure Laterality Date   CHOLECYSTECTOMY     OPEN REDUCTION INTERNAL FIXATION (ORIF) DISTAL PHALANX Right 07/12/2021   Procedure: OPEN REDUCTION INTERNAL FIXATION (ORIF) DISTAL RADIUS;  Surgeon: Allena Napoleon, MD;  Location: [facility] OR;  Service: Plastics;  Laterality: Right;    There were no vitals filed for this visit.   Subjective Assessment - 09/23/21 1635     Subjective  Pt reports his swelling is improving    Pertinent History 55 yo male with onset of fall at work off a ladder was brought to hosp on 9/23 and received ORIF for R distal radial fracture on 9/26.  Mildly displaced R ulnar styloid fracture not requiring sx.  Received TLSO for spinal injuries of T12 comp fx, T11 anterior wedging, B inferior articular process and spinous process fractures.  PMHx:  HA, colloid cyst of brain, idiopathic intracranial hypertension, LE edema, lumbar spondylosis and radiculopathy, chestpain, HLD, seizure disorder.    Limitations cleared for ROM and strengthening per MD, back brace on, NWB through wrist    Patient  Stated Goals custom splint    Currently in Pain? Yes    Pain Score 4     Pain Location Hand    Pain Orientation Right    Pain Descriptors / Indicators Aching    Pain Type Surgical pain    Pain Onset More than a month ago    Pain Frequency Intermittent    Aggravating Factors  ROM    Pain Relieving Factors rest, [place]                      Treatment:Treatment:US , 0.8 w/cm2 , 20% x 8 mins to palm and digits and dorsal hand for edema control, no adverse reactions A/ROM and P/ROM to digits in composite flexion, MP flexion(individual and composite), hook fist, A/ROM and P/ROM wrist flexion and extension, supination/ pronation Passive stretch with 2lbs weight in flexion and extensio followed by wrist flexion/ extension strengthening x 10 reps each with 1lbs weight Reviewed yellow putty exercises for sustained grip and pinch, min v.c Pt demonstrates improved finger flexion, grossly 90% A/ROM following stretch New Edema glove issued  due to wear on old glove and tensogrip reapplied at  end of session.               OT Short Term Goals - 09/21/21 1615       OT SHORT TERM GOAL #1  Title I with splint wear, care and precautions    Time 6    Period Weeks    Status Achieved    Target Date 09/15/21      OT SHORT TERM GOAL #2   Title I with inital HEP    Time 6    Period Weeks    Status Achieved      OT SHORT TERM GOAL #3   Title Pt will demonstrate ability to perfrom at least 90% composite finger flexion in prep for functional use.    Time 6    Period Weeks    Status On-going   75-80%     OT SHORT TERM GOAL #4   Title Pt will demonstrate wrist flexion/ extension of at least 40* in prep for functional use.    Time 6    Period Weeks    Status On-going   40 for flexion, 25 for extension     OT SHORT TERM GOAL #5   Title I with edema control.    Time 6    Period Weeks    Status New               OT Long Term Goals - 09/21/21 1615       OT LONG  TERM GOAL #1   Title I with updated HEP.    Time 12    Period Weeks    Status New    Target Date 10/27/21      OT LONG TERM GOAL #2   Title Pt will demonstrate grip strength of at least 40 lbs in prep for work activities.    Time 12    Period Weeks    Status New      OT LONG TERM GOAL #3   Title Pt will demonstrate wrist flexion/ extension WFLS for work activities.    Time 12    Period Weeks    Status New      OT LONG TERM GOAL #4   Title Pt will demonstrate forearm supination/ pronation of at least 85* in prep for work.    Time 12    Period Weeks    Status New      OT LONG TERM GOAL #5   Title Pt will resume use of RUE at least 90% of the time for ADLS/ IADLS with pain no greater than 3/10.    Time 12    Period Weeks    Status New      OT LONG TERM GOAL #6   Title Pt will perform simulated work activities modified independently    Time 12    Period Weeks    Status New                   Plan - 09/23/21 1633     Clinical Impression Statement Pt is progressing towards goals. He demonstrates improving ROM and therapist initated light strengthening today.    OT Occupational Profile and History Detailed Assessment- Review of Records and additional review of physical, cognitive, psychosocial history related to current functional performance    Occupational performance deficits (Please refer to evaluation for details): ADL's;IADL's;Work;Social Participation    Body Structure / Function / Physical Skills ADL;UE functional use;Endurance;Balance;Flexibility;Pain;FMC;ROM;Coordination;GMC;Decreased knowledge of precautions;Sensation;Scar mobility;Decreased knowledge of use of DME;IADL;Skin integrity;Dexterity;Strength;Mobility;Edema    Rehab Potential Good    Clinical Decision Making Limited treatment options, no task modification necessary    Comorbidities Affecting Occupational Performance: May have comorbidities impacting occupational performance  Modification or  Assistance to Complete Evaluation  Min-Moderate modification of tasks or assist with assess necessary to complete eval    OT Frequency 2x / week    OT Duration 12 weeks   plus eval, anticipate d/c after 8-10 weeks dependent upon progress   OT Treatment/Interventions Self-care/ADL training;Ultrasound;Patient/family education;Scar mobilization;DME and/or AE instruction;Paraffin;Passive range of motion;Fluidtherapy;Cryotherapy;Functional Mobility Training;Splinting;Therapeutic activities;Manual Therapy;Moist Heat;Neuromuscular education    Plan continue [place] to palm and dorsal hand,  A/ROM , P/ROM wrist flexion and extension, yellow putty, continue gentle wrist strengthening    Consulted and Agree with Plan of Care Patient             Patient will benefit from skilled therapeutic intervention in order to improve the following deficits and impairments:   Body Structure / Function / Physical Skills: ADL, UE functional use, Endurance, Balance, Flexibility, Pain, FMC, ROM, Coordination, GMC, Decreased knowledge of precautions, Sensation, Scar mobility, Decreased knowledge of use of DME, IADL, Skin integrity, Dexterity, Strength, Mobility, Edema       Visit Diagnosis: Stiffness of right wrist, not elsewhere classified  Pain in right wrist  Localized edema  Other lack of coordination  Muscle weakness (generalized)    Problem List Patient Active Problem List   Diagnosis Date Noted   Fall 07/09/2021    Talar Fraley, OT 09/23/2021, 4:43 PM  [facility] [address] [place], [place], [address] Phone: [phone]   Fax:  [phone]  Name: Craig Church MRN: [id number] Date of Birth: Oct 30, 1965

## 2021-09-27 ENCOUNTER — Ambulatory Visit (HOSPITAL_COMMUNITY)
Admission: RE | Admit: 2021-09-27 | Discharge: 2021-09-27 | Disposition: A | Discharge status: Home / Self Care | Payer: Worker's Compensation | Source: Ambulatory Visit | Attending: Surgical | Admitting: Surgical

## 2021-09-27 ENCOUNTER — Other Ambulatory Visit: Payer: Self-pay

## 2021-09-27 DIAGNOSIS — S52501A Unspecified fracture of the lower end of right radius, initial encounter for closed fracture: Secondary | ICD-10-CM | POA: Diagnosis not present

## 2021-09-28 ENCOUNTER — Ambulatory Visit: Payer: Worker's Compensation | Admitting: Occupational Therapy

## 2021-09-28 DIAGNOSIS — M25631 Stiffness of right wrist, not elsewhere classified: Secondary | ICD-10-CM | POA: Diagnosis not present

## 2021-09-28 DIAGNOSIS — R600 Localized edema: Secondary | ICD-10-CM

## 2021-09-28 DIAGNOSIS — M25531 Pain in right wrist: Secondary | ICD-10-CM

## 2021-09-28 DIAGNOSIS — M6281 Muscle weakness (generalized): Secondary | ICD-10-CM

## 2021-09-28 DIAGNOSIS — R278 Other lack of coordination: Secondary | ICD-10-CM

## 2021-09-28 NOTE — Therapy (Signed)
[facility] [address] [place], [place], [address] Phone: [phone]   Fax:  [phone]  Occupational Therapy Treatment  Patient Details  Name: Craig Church MRN: [id number] Date of Birth: Feb 16, 1966 Referring Provider (OT): Altha Harm PA-C   Encounter Date: 09/28/2021   OT End of Session - 09/28/21 1512     Visit Number 9    Number of Visits 25    Date for OT Re-Evaluation 10/27/21    Authorization Type workers comp    Authorization - Visit Number 9    Authorization - Number of Visits 16    OT Start Time 1315    OT Stop Time 1400    OT Time Calculation (min) 45 min    Activity Tolerance Patient tolerated treatment well    Behavior During Therapy W[facility] for tasks assessed/performed             Past Medical History:  Diagnosis Date   Hypertension    Seizure disorder (HCC)     Past Surgical History:  Procedure Laterality Date   CHOLECYSTECTOMY     OPEN REDUCTION INTERNAL FIXATION (ORIF) DISTAL PHALANX Right 07/12/2021   Procedure: OPEN REDUCTION INTERNAL FIXATION (ORIF) DISTAL RADIUS;  Surgeon: Allena Napoleon, MD;  Location: [facility] OR;  Service: Plastics;  Laterality: Right;    There were no vitals filed for this visit.   Subjective Assessment - 09/28/21 1515     Subjective  Pt rpeorts pain is better    Pertinent History 55 yo male with onset of fall at work off a ladder was brought to hosp on 9/23 and received ORIF for R distal radial fracture on 9/26.  Mildly displaced R ulnar styloid fracture not requiring sx.  Received TLSO for spinal injuries of T12 comp fx, T11 anterior wedging, B inferior articular process and spinous process fractures.  PMHx:  HA, colloid cyst of brain, idiopathic intracranial hypertension, LE edema, lumbar spondylosis and radiculopathy, chestpain, HLD, seizure disorder.    Limitations cleared for ROM and strengthening per MD, back brace on, NWB through wrist    Patient Stated  Goals custom splint    Currently in Pain? Yes    Pain Score 3     Pain Location Hand    Pain Orientation Right    Pain Descriptors / Indicators Aching    Pain Type Surgical pain    Pain Onset More than a month ago    Pain Frequency Intermittent    Aggravating Factors  ROM    Pain Relieving Factors rest [place]                        Treatment Fluidotherapy x 10 mins for stiffness and pain, no adverse reactions. [place] , 0.8 w/cm2 , 20% x 8 mins to palm and digits and dorsal hand for edema control, no adverse reactions A/ROM and P/ROM to digits in composite flexion, MP flexion(individual and composite), reverse blocking A/ROM and P/ROM wrist flexion and extension,  Passive stretch with 3lbs weight in flexion and extension followed by wrist flexion/ extension strengthening x 10 reps each with 1lbs weight Pen rolling exercise using foam roll for IP to composite flexion, pt became frustrated and tearful  due to difficulty with task, encouragement provided. Pt is overall discouraged today as pt's MD did not d/c back brace or walker as pt had hoped.            OT Short Term Goals - 09/21/21 1615  OT SHORT TERM GOAL #1   Title I with splint wear, care and precautions    Time 6    Period Weeks    Status Achieved    Target Date 09/15/21      OT SHORT TERM GOAL #2   Title I with inital HEP    Time 6    Period Weeks    Status Achieved      OT SHORT TERM GOAL #3   Title Pt will demonstrate ability to perfrom at least 90% composite finger flexion in prep for functional use.    Time 6    Period Weeks    Status On-going   75-80%     OT SHORT TERM GOAL #4   Title Pt will demonstrate wrist flexion/ extension of at least 40* in prep for functional use.    Time 6    Period Weeks    Status On-going   40 for flexion, 25 for extension     OT SHORT TERM GOAL #5   Title I with edema control.    Time 6    Period Weeks    Status New               OT Long  Term Goals - 09/21/21 1615       OT LONG TERM GOAL #1   Title I with updated HEP.    Time 12    Period Weeks    Status New    Target Date 10/27/21      OT LONG TERM GOAL #2   Title Pt will demonstrate grip strength of at least 40 lbs in prep for work activities.    Time 12    Period Weeks    Status New      OT LONG TERM GOAL #3   Title Pt will demonstrate wrist flexion/ extension WFLS for work activities.    Time 12    Period Weeks    Status New      OT LONG TERM GOAL #4   Title Pt will demonstrate forearm supination/ pronation of at least 85* in prep for work.    Time 12    Period Weeks    Status New      OT LONG TERM GOAL #5   Title Pt will resume use of RUE at least 90% of the time for ADLS/ IADLS with pain no greater than 3/10.    Time 12    Period Weeks    Status New      OT LONG TERM GOAL #6   Title Pt will perform simulated work activities modified independently    Time 12    Period Weeks    Status New                    Patient will benefit from skilled therapeutic intervention in order to improve the following deficits and impairments:           Visit Diagnosis: Stiffness of right wrist, not elsewhere classified  Pain in right wrist  Localized edema  Other lack of coordination  Muscle weakness (generalized)    Problem List Patient Active Problem List   Diagnosis Date Noted   Fall 07/09/2021    Francene Mcerlean, OT 09/28/2021, 3:16 PM   [facility] [address] [place], [place], [address] Phone: [phone]   Fax:  [phone]  Name: Craig Church MRN: [id number] Date of Birth: 1966-05-17

## 2021-09-30 ENCOUNTER — Ambulatory Visit: Payer: Worker's Compensation | Admitting: Occupational Therapy

## 2021-09-30 ENCOUNTER — Other Ambulatory Visit: Payer: Self-pay

## 2021-09-30 DIAGNOSIS — M25631 Stiffness of right wrist, not elsewhere classified: Secondary | ICD-10-CM | POA: Diagnosis not present

## 2021-09-30 DIAGNOSIS — R600 Localized edema: Secondary | ICD-10-CM

## 2021-09-30 DIAGNOSIS — M6281 Muscle weakness (generalized): Secondary | ICD-10-CM

## 2021-09-30 NOTE — Patient Instructions (Signed)
Your Splint This splint should initially be fitted by a healthcare practitioner.  The healthcare practitioner is responsible for providing wearing instructions and precautions to the patient, other healthcare practitioners and care provider involved in the patient's care.  This splint was custom made for you. Please read the following instructions to learn about wearing and caring for your splint.  Precautions Should your splint cause any of the following problems, remove the splint immediately and contact your therapist/physician. Swelling Severe Pain Pressure Areas Stiffness Numbness  Do not wear your splint while operating machinery unless it has been fabricated for that purpose.  When To Wear Your Splint Where your splint according to your therapist/physician instructions. Daytime for 15-20 mins, check skin for pressure areas or redness that does not go away. Stop wearing splint if any problems. You can wear splint while awake for 15-20 mins 2-3 x day. Do not sleep in splint.   Wear compression glove at night    Care and Cleaning of Your Splint Keep your splint away from open flames. Your splint will lose its shape in temperatures over 135 degrees Farenheit, ( in car windows, near radiators, ovens or in hot water).  Never make any adjustments to your splint, if the splint needs adjusting remove it and make an appointment to see your therapist. Your splint, including the cushion liner may be cleaned with soap and lukewarm water.  Do not immerse in hot water over 135 degrees Farenheit.

## 2021-09-30 NOTE — Therapy (Signed)
[facility] [address] [place], [place], [address] Phone: [phone]   Fax:  [phone]  Occupational Therapy Treatment  Patient Details  Name: Craig Church MRN: [id number] Date of Birth: 07/12/1966 Referring Provider (OT): Altha Harm PA-C   Encounter Date: 09/30/2021   OT End of Session - 09/30/21 1623     Visit Number 10    Number of Visits 25    Date for OT Re-Evaluation 10/27/21    Authorization Type workers comp    Authorization - Visit Number 10    Authorization - Number of Visits 16    OT Start Time 1317    OT Stop Time 1404    OT Time Calculation (min) 47 min    Activity Tolerance Patient tolerated treatment well    Behavior During Therapy [facility] for tasks assessed/performed             Past Medical History:  Diagnosis Date   Hypertension    Seizure disorder (HCC)     Past Surgical History:  Procedure Laterality Date   CHOLECYSTECTOMY     OPEN REDUCTION INTERNAL FIXATION (ORIF) DISTAL PHALANX Right 07/12/2021   Procedure: OPEN REDUCTION INTERNAL FIXATION (ORIF) DISTAL RADIUS;  Surgeon: Allena Napoleon, MD;  Location: [facility] OR;  Service: Plastics;  Laterality: Right;    There were no vitals filed for this visit.   Subjective Assessment - 09/30/21 1626     Subjective  Pt reports continued pain    Pertinent History 55 yo male with onset of fall at work off a ladder was brought to hosp on 9/23 and received ORIF for R distal radial fracture on 9/26.  Mildly displaced R ulnar styloid fracture not requiring sx.  Received TLSO for spinal injuries of T12 comp fx, T11 anterior wedging, B inferior articular process and spinous process fractures.  PMHx:  HA, colloid cyst of brain, idiopathic intracranial hypertension, LE edema, lumbar spondylosis and radiculopathy, chestpain, HLD, seizure disorder.    Limitations cleared for ROM and strengthening per MD, back brace on, NWB through wrist    Patient Stated  Goals custom splint    Currently in Pain? Yes    Pain Score 4     Pain Location Hand    Pain Orientation Right    Pain Descriptors / Indicators Aching    Pain Type Surgical pain;Chronic pain    Pain Onset More than a month ago    Pain Frequency Intermittent    Aggravating Factors  ROM, cold temps    Pain Relieving Factors rest, [place]                  Treatment:US , 0.8 w/cm2 , 20% x 8 mins to palm and digits and dorsal hand for edema control, no adverse reactions A/ROM followed by gentle passive stretch P/ROM to digits in composite flexion,  Yellow putty exercises for composite grip and pinch Therapist cut down palmar portion of wrist brace, added padding  and paired with flexion glove to promote sustained MP flexion. Pt returned demonstration of donning and doffing.               OT Education - 09/30/21 1602     Education Details wrist splint and flexion glove combo splint, pt practiced donning and doffing, he was instructed to only to wear 15-20 mins at a time, splint wear, care and precautions   Person(s) Educated Patient    Methods Explanation;Demonstration;Verbal cues;Handout    Comprehension Verbalized understanding;Returned demonstration;Verbal  cues required              OT Short Term Goals - 09/30/21 1625       OT SHORT TERM GOAL #1   Title I with splint wear, care and precautions    Time 6    Period Weeks    Status Achieved    Target Date 09/15/21      OT SHORT TERM GOAL #2   Title I with inital HEP    Time 6    Period Weeks    Status Achieved      OT SHORT TERM GOAL #3   Title Pt will demonstrate ability to perfrom at least 90% composite finger flexion in prep for functional use.    Time 6    Period Weeks    Status On-going   75-80%     OT SHORT TERM GOAL #4   Title Pt will demonstrate wrist flexion/ extension of at least 40* in prep for functional use.    Time 6    Period Weeks    Status On-going   40 for flexion, 25 for  extension     OT SHORT TERM GOAL #5   Title I with edema control.    Time 6    Period Weeks    Status Achieved               OT Long Term Goals - 09/21/21 1615       OT LONG TERM GOAL #1   Title I with updated HEP.    Time 12    Period Weeks    Status New    Target Date 10/27/21      OT LONG TERM GOAL #2   Title Pt will demonstrate grip strength of at least 40 lbs in prep for work activities.    Time 12    Period Weeks    Status New      OT LONG TERM GOAL #3   Title Pt will demonstrate wrist flexion/ extension WFLS for work activities.    Time 12    Period Weeks    Status New      OT LONG TERM GOAL #4   Title Pt will demonstrate forearm supination/ pronation of at least 85* in prep for work.    Time 12    Period Weeks    Status New      OT LONG TERM GOAL #5   Title Pt will resume use of RUE at least 90% of the time for ADLS/ IADLS with pain no greater than 3/10.    Time 12    Period Weeks    Status New      OT LONG TERM GOAL #6   Title Pt will perform simulated work activities modified independently    Time 12    Period Weeks    Status New                   Plan - 09/30/21 1623     Clinical Impression Statement Pt is progressing towards goals. He continues to demonstrate limitations in MP flexion. Therapist issued flexion flove for pt to wear with wrist brace to promote MP flexion.    OT Occupational Profile and History Detailed Assessment- Review of Records and additional review of physical, cognitive, psychosocial history related to current functional performance    Occupational performance deficits (Please refer to evaluation for details): ADL's;IADL's;Work;Social Participation    Body Structure / Function / Physical  Skills ADL;UE functional use;Endurance;Balance;Flexibility;Pain;FMC;ROM;Coordination;GMC;Decreased knowledge of precautions;Sensation;Scar mobility;Decreased knowledge of use of DME;IADL;Skin  integrity;Dexterity;Strength;Mobility;Edema    Rehab Potential Good    Clinical Decision Making Limited treatment options, no task modification necessary    Comorbidities Affecting Occupational Performance: May have comorbidities impacting occupational performance    Modification or Assistance to Complete Evaluation  Min-Moderate modification of tasks or assist with assess necessary to complete eval    OT Frequency 2x / week    OT Duration 12 weeks   plus eval, anticipate d/c after 8-10 weeks dependent upon progress   OT Treatment/Interventions Self-care/ADL training;Ultrasound;Patient/family education;Scar mobilization;DME and/or AE instruction;Paraffin;Passive range of motion;Fluidtherapy;Cryotherapy;Functional Mobility Training;Splinting;Therapeutic activities;Manual Therapy;Moist Heat;Neuromuscular education    Plan splint check, continue ROM, light strengthening, [place]    Consulted and Agree with Plan of Care Patient             Patient will benefit from skilled therapeutic intervention in order to improve the following deficits and impairments:   Body Structure / Function / Physical Skills: ADL, UE functional use, Endurance, Balance, Flexibility, Pain, FMC, ROM, Coordination, GMC, Decreased knowledge of precautions, Sensation, Scar mobility, Decreased knowledge of use of DME, IADL, Skin integrity, Dexterity, Strength, Mobility, Edema       Visit Diagnosis: No diagnosis found.    Problem List Patient Active Problem List   Diagnosis Date Noted   Fall 07/09/2021    Jaimya Feliciano, OT 09/30/2021, 4:27 PM  [facility] [address] [place], [place], [address] Phone: [phone]   Fax:  [phone]  Name: Craig Church MRN: [id number] Date of Birth: [birth year]

## 2021-10-03 DIAGNOSIS — M6281 Muscle weakness (generalized): Secondary | ICD-10-CM | POA: Diagnosis present

## 2021-10-03 DIAGNOSIS — R278 Other lack of coordination: Secondary | ICD-10-CM | POA: Diagnosis present

## 2021-10-03 DIAGNOSIS — M25631 Stiffness of right wrist, not elsewhere classified: Secondary | ICD-10-CM | POA: Diagnosis present

## 2021-10-03 DIAGNOSIS — M25531 Pain in right wrist: Secondary | ICD-10-CM | POA: Diagnosis present

## 2021-10-03 DIAGNOSIS — R600 Localized edema: Secondary | ICD-10-CM | POA: Diagnosis present

## 2021-10-05 ENCOUNTER — Other Ambulatory Visit: Payer: Self-pay

## 2021-10-05 ENCOUNTER — Ambulatory Visit: Payer: Worker's Compensation | Admitting: Occupational Therapy

## 2021-10-05 DIAGNOSIS — M25631 Stiffness of right wrist, not elsewhere classified: Secondary | ICD-10-CM

## 2021-10-05 DIAGNOSIS — M25531 Pain in right wrist: Secondary | ICD-10-CM

## 2021-10-05 DIAGNOSIS — R600 Localized edema: Secondary | ICD-10-CM

## 2021-10-05 DIAGNOSIS — M6281 Muscle weakness (generalized): Secondary | ICD-10-CM

## 2021-10-05 NOTE — Patient Instructions (Signed)
Composite Extension (Passive Flexor Stretch)    Sitting with elbows on table and palms together, slowly lower wrists toward table until stretch is felt. Be sure to keep palms together throughout stretch. Hold __10__ seconds. Relax. Repeat __5__ times. Do __3__ sessions per day.   MP Extension (Active)    With palm on table, straighten fingers completely at large knuckles, and lift fingers off table. Hold __3__ seconds. Repeat __10__ times. Do __3__ sessions per day. Activity: Tap fingers one at a time on table.*  Wrist Extension: Resisted    With right palm down, __2__ pound weight in hand, bend wrist up. Return slowly AND hold down x 5 sec. Repeat __10__ times per set.  Do __2__ sessions per day.  Wrist Flexion: Resisted    With right palm up, _2___ pound weight in hand, bend wrist up. Return slowly back down and hold x 5 sec for stretch. Repeat __10__ times per set.  Do _2___ sessions per day.

## 2021-10-05 NOTE — Therapy (Signed)
[facility] [address] [place], [place], [address] Phone: [phone]   Fax:  [phone]  Occupational Therapy Treatment  Patient Details  Name: Craig Church MRN: [id number] Date of Birth: July 10, 1966 Referring Provider (OT): Altha Harm PA-C   Encounter Date: 10/05/2021   OT End of Session - 10/05/21 1320     Visit Number 11    Number of Visits 25    Date for OT Re-Evaluation 10/27/21    Authorization Type workers comp    Authorization - Visit Number 11    Authorization - Number of Visits 16    OT Start Time 1225    OT Stop Time 1320    OT Time Calculation (min) 55 min    Activity Tolerance Patient tolerated treatment well    Behavior During Therapy WFL for tasks assessed/performed             Past Medical History:  Diagnosis Date   Hypertension    Seizure disorder (HCC)     Past Surgical History:  Procedure Laterality Date   CHOLECYSTECTOMY     OPEN REDUCTION INTERNAL FIXATION (ORIF) DISTAL PHALANX Right 07/12/2021   Procedure: OPEN REDUCTION INTERNAL FIXATION (ORIF) DISTAL RADIUS;  Surgeon: Allena Napoleon, MD;  Location: [facility] OR;  Service: Plastics;  Laterality: Right;    There were no vitals filed for this visit.   Subjective Assessment - 10/05/21 1225     Subjective  Pt reports continued pain. Pt also reports flexion glove working well w/ splint   Pertinent History 55 yo male with onset of fall at work off a ladder was brought to hosp on 9/23 and received ORIF for R distal radial fracture on 9/26.  Mildly displaced R ulnar styloid fracture not requiring sx.  Received TLSO for spinal injuries of T12 comp fx, T11 anterior wedging, B inferior articular process and spinous process fractures.  PMHx:  HA, colloid cyst of brain, idiopathic intracranial hypertension, LE edema, lumbar spondylosis and radiculopathy, chestpain, HLD, seizure disorder.    Limitations cleared for ROM and strengthening per MD,  back brace on, NWB through wrist    Patient Stated Goals custom splint    Currently in Pain? Yes    Pain Score 4     Pain Location Hand    Pain Orientation Right    Pain Descriptors / Indicators Aching    Pain Type Chronic pain;Surgical pain    Pain Onset More than a month ago    Pain Frequency Intermittent    Aggravating Factors  ROM, cold temps    Pain Relieving Factors rest, [place]             Noted edema both volarly at base of MP's and in palm, and dorsally b/t MP joints at dorsal interossei. Discussed/reviewed edema management strategies which contributes to ROM limitations.   Pulsed [place] at 50%, 3 Mhz, 0.8 wts/cm2 x 8 min to volar hand (palm and base of MP's) and x 5 min dorsal hand b/t MP's (13 minutes total) Followed by isolated finger extension ex's for tendon gliding - pt able to do better than before [place].   Also stressed importance of full composite flexion, actively, passively, and place & hold for extrinsic stretch. Noted pt was compensating into some MP extension d/t edema, tightness during full composite flexion.   Issued additional HEP - pt demo each with cueing for max benefit. Initiated wrist strengthening w/ 2 lb weight but also instructed to perform weighted stretches to  increase wrist flex & ext  Near end of session: noted tightness in joint mobility b/t 2nd and 3rd metacarpals compared to Lt hand. Spent last 5 minutes performing joint mobs at metacarpals                     OT Education - 10/05/21 1309     Education Details Updated HEP    Person(s) Educated Patient    Methods Explanation;Demonstration;Verbal cues;Handout    Comprehension Verbalized understanding;Returned demonstration;Verbal cues required              OT Short Term Goals - 09/30/21 1625       OT SHORT TERM GOAL #1   Title I with splint wear, care and precautions    Time 6    Period Weeks    Status Achieved    Target Date 09/15/21      OT SHORT TERM GOAL #2    Title I with inital HEP    Time 6    Period Weeks    Status Achieved      OT SHORT TERM GOAL #3   Title Pt will demonstrate ability to perfrom at least 90% composite finger flexion in prep for functional use.    Time 6    Period Weeks    Status On-going   75-80%     OT SHORT TERM GOAL #4   Title Pt will demonstrate wrist flexion/ extension of at least 40* in prep for functional use.    Time 6    Period Weeks    Status On-going   40 for flexion, 25 for extension     OT SHORT TERM GOAL #5   Title I with edema control.    Time 6    Period Weeks    Status Achieved               OT Long Term Goals - 09/21/21 1615       OT LONG TERM GOAL #1   Title I with updated HEP.    Time 12    Period Weeks    Status New    Target Date 10/27/21      OT LONG TERM GOAL #2   Title Pt will demonstrate grip strength of at least 40 lbs in prep for work activities.    Time 12    Period Weeks    Status New      OT LONG TERM GOAL #3   Title Pt will demonstrate wrist flexion/ extension WFLS for work activities.    Time 12    Period Weeks    Status New      OT LONG TERM GOAL #4   Title Pt will demonstrate forearm supination/ pronation of at least 85* in prep for work.    Time 12    Period Weeks    Status New      OT LONG TERM GOAL #5   Title Pt will resume use of RUE at least 90% of the time for ADLS/ IADLS with pain no greater than 3/10.    Time 12    Period Weeks    Status New      OT LONG TERM GOAL #6   Title Pt will perform simulated work activities modified independently    Time 12    Period Weeks    Status New                   Plan - 10/05/21  1321     Clinical Impression Statement Pt progressing towards goals. Limited in full ROM by edema; also appears to have decreased mobility in metacarpals w/ joint mobs    OT Occupational Profile and History Detailed Assessment- Review of Records and additional review of physical, cognitive, psychosocial history  related to current functional performance    Occupational performance deficits (Please refer to evaluation for details): ADL's;IADL's;Work;Social Participation    Body Structure / Function / Physical Skills ADL;UE functional use;Endurance;Balance;Flexibility;Pain;FMC;ROM;Coordination;GMC;Decreased knowledge of precautions;Sensation;Scar mobility;Decreased knowledge of use of DME;IADL;Skin integrity;Dexterity;Strength;Mobility;Edema    Rehab Potential Good    Clinical Decision Making Limited treatment options, no task modification necessary    Comorbidities Affecting Occupational Performance: May have comorbidities impacting occupational performance    Modification or Assistance to Complete Evaluation  Min-Moderate modification of tasks or assist with assess necessary to complete eval    OT Frequency 2x / week    OT Duration 12 weeks   plus eval, anticipate d/c after 8-10 weeks dependent upon progress   OT Treatment/Interventions Self-care/ADL training;Ultrasound;Patient/family education;Scar mobilization;DME and/or AE instruction;Paraffin;Passive range of motion;Fluidtherapy;Cryotherapy;Functional Mobility Training;Splinting;Therapeutic activities;Manual Therapy;Moist Heat;Neuromuscular education    Plan continue pulsed [place] for edema, continue A/ROM, P/ROM, strengthening wrist and hand    Consulted and Agree with Plan of Care Patient             Patient will benefit from skilled therapeutic intervention in order to improve the following deficits and impairments:   Body Structure / Function / Physical Skills: ADL, UE functional use, Endurance, Balance, Flexibility, Pain, FMC, ROM, Coordination, GMC, Decreased knowledge of precautions, Sensation, Scar mobility, Decreased knowledge of use of DME, IADL, Skin integrity, Dexterity, Strength, Mobility, Edema       Visit Diagnosis: Stiffness of right wrist, not elsewhere classified  Pain in right wrist  Localized edema  Muscle weakness  (generalized)    Problem List Patient Active Problem List   Diagnosis Date Noted   Fall 07/09/2021    Kelli Churn, OTR/L 10/05/2021, 1:23 PM  [facility] [address] [place], [place], [address] Phone: [phone]   Fax:  [phone]  Name: Bartolo Montanye MRN: [id number] Date of Birth: Sep 23, 1966

## 2021-10-07 ENCOUNTER — Ambulatory Visit: Payer: Worker's Compensation | Admitting: Occupational Therapy

## 2021-10-07 ENCOUNTER — Encounter: Payer: Self-pay | Admitting: Occupational Therapy

## 2021-10-07 ENCOUNTER — Other Ambulatory Visit: Payer: Self-pay

## 2021-10-07 ENCOUNTER — Ambulatory Visit (INDEPENDENT_AMBULATORY_CARE_PROVIDER_SITE_OTHER): Payer: Worker's Compensation | Admitting: Plastic Surgery

## 2021-10-07 DIAGNOSIS — R600 Localized edema: Secondary | ICD-10-CM

## 2021-10-07 DIAGNOSIS — M25631 Stiffness of right wrist, not elsewhere classified: Secondary | ICD-10-CM

## 2021-10-07 DIAGNOSIS — S52501A Unspecified fracture of the lower end of right radius, initial encounter for closed fracture: Secondary | ICD-10-CM

## 2021-10-07 DIAGNOSIS — M6281 Muscle weakness (generalized): Secondary | ICD-10-CM

## 2021-10-07 DIAGNOSIS — M25531 Pain in right wrist: Secondary | ICD-10-CM

## 2021-10-07 DIAGNOSIS — R278 Other lack of coordination: Secondary | ICD-10-CM

## 2021-10-07 NOTE — Therapy (Signed)
[facility] [address] [place], [place], [address] Phone: [phone]   Fax:  [phone]  Occupational Therapy Treatment  Patient Details  Name: Craig Church MRN: [id number] Date of Birth: 09-Apr-1966 Referring Provider (OT): Altha Harm PA-C   Encounter Date: 10/07/2021   OT End of Session - 10/07/21 1724     Visit Number 12    Number of Visits 25    Date for OT Re-Evaluation 10/27/21    Authorization Type workers comp    Authorization - Visit Number 12    Authorization - Number of Visits 16    OT Start Time 1615    OT Stop Time 1700    OT Time Calculation (min) 45 min    Activity Tolerance Patient tolerated treatment well    Behavior During Therapy WFL for tasks assessed/performed             Past Medical History:  Diagnosis Date   Hypertension    Seizure disorder (HCC)     Past Surgical History:  Procedure Laterality Date   CHOLECYSTECTOMY     OPEN REDUCTION INTERNAL FIXATION (ORIF) DISTAL PHALANX Right 07/12/2021   Procedure: OPEN REDUCTION INTERNAL FIXATION (ORIF) DISTAL RADIUS;  Surgeon: Allena Napoleon, MD;  Location: [facility] OR;  Service: Plastics;  Laterality: Right;    There were no vitals filed for this visit.   Subjective Assessment - 10/07/21 1706     Subjective  Patient had just seen Dr Arita Miss.  Patient and wife indicated that the MD told him to discontinue the brace - and that he hoped he would be further along.    Pertinent History 55 yo male with onset of fall at work off a ladder was brought to hosp on 9/23 and received ORIF for R distal radial fracture on 9/26.  Mildly displaced R ulnar styloid fracture not requiring sx.  Received TLSO for spinal injuries of T12 comp fx, T11 anterior wedging, B inferior articular process and spinous process fractures.  PMHx:  HA, colloid cyst of brain, idiopathic intracranial hypertension, LE edema, lumbar spondylosis and radiculopathy, chestpain, HLD,  seizure disorder.    Limitations cleared for ROM and strengthening per MD, back brace on, NWB through wrist    Patient Stated Goals custom splint    Currently in Pain? Yes    Pain Score 4     Pain Location Hand    Pain Orientation Right    Pain Descriptors / Indicators Aching    Pain Type Chronic pain    Pain Onset More than a month ago    Pain Frequency Intermittent    Aggravating Factors  ROM, Cold    Pain Relieving Factors rest, [place]                          OT Treatments/Exercises (OP) - 10/07/21 0001       Wrist Exercises   Other wrist exercises weighted stretch for wrist flexion and extension w/ 2# dumbbell, and active wrist flexion and extension.      Hand Exercises   Other Hand Exercises Composite flexion - passive to active - patient able to skim 3rd finger palm to palm surface following stretch.      Modalities   Modalities Ultrasound      Ultrasound   Ultrasound Location palm, digits, dorsum of hand    Ultrasound Parameters , 50%, 0.8 w/cm2 x 8 min volar palm, and 5 min dorsum hand  Ultrasound Goals Edema;Pain      Manual Therapy   Passive ROM Digits for composite flexion/extension                      OT Short Term Goals - 10/07/21 1726       OT SHORT TERM GOAL #1   Title I with splint wear, care and precautions    Time 6    Period Weeks    Status Achieved    Target Date 09/15/21      OT SHORT TERM GOAL #2   Title I with inital HEP    Time 6    Period Weeks    Status Achieved      OT SHORT TERM GOAL #3   Title Pt will demonstrate ability to perfrom at least 90% composite finger flexion in prep for functional use.    Time 6    Period Weeks    Status On-going   75-80%     OT SHORT TERM GOAL #4   Title Pt will demonstrate wrist flexion/ extension of at least 40* in prep for functional use.    Time 6    Period Weeks    Status On-going   40 for flexion, 25 for extension     OT SHORT TERM GOAL #5   Title I with  edema control.    Time 6    Period Weeks    Status Achieved               OT Long Term Goals - 10/07/21 1726       OT LONG TERM GOAL #1   Title I with updated HEP.    Time 12    Period Weeks    Status On-going    Target Date 10/27/21      OT LONG TERM GOAL #2   Title Pt will demonstrate grip strength of at least 40 lbs in prep for work activities.    Time 12    Period Weeks    Status On-going      OT LONG TERM GOAL #3   Title Pt will demonstrate wrist flexion/ extension WFLS for work activities.    Time 12    Period Weeks    Status On-going      OT LONG TERM GOAL #4   Title Pt will demonstrate forearm supination/ pronation of at least 85* in prep for work.    Time 12    Period Weeks    Status On-going      OT LONG TERM GOAL #5   Title Pt will resume use of RUE at least 90% of the time for ADLS/ IADLS with pain no greater than 3/10.    Time 12    Period Weeks    Status On-going      OT LONG TERM GOAL #6   Title Pt will perform simulated work activities modified independently    Time 12    Period Weeks    Status On-going                   Plan - 10/07/21 1724     Clinical Impression Statement Pt continues to show slow but steady improvement of range of motion in digits and wrist, although continues to experience edema    OT Occupational Profile and History Detailed Assessment- Review of Records and additional review of physical, cognitive, psychosocial history related to current functional performance    Occupational performance deficits (  Please refer to evaluation for details): ADL's;IADL's;Work;Social Participation    Body Structure / Function / Physical Skills ADL;UE functional use;Endurance;Balance;Flexibility;Pain;FMC;ROM;Coordination;GMC;Decreased knowledge of precautions;Sensation;Scar mobility;Decreased knowledge of use of DME;IADL;Skin integrity;Dexterity;Strength;Mobility;Edema    Rehab Potential Good    Clinical Decision Making Limited  treatment options, no task modification necessary    Comorbidities Affecting Occupational Performance: May have comorbidities impacting occupational performance    Modification or Assistance to Complete Evaluation  Min-Moderate modification of tasks or assist with assess necessary to complete eval    OT Frequency 2x / week    OT Duration 12 weeks   plus eval, anticipate d/c after 8-10 weeks dependent upon progress   OT Treatment/Interventions Self-care/ADL training;Ultrasound;Patient/family education;Scar mobilization;DME and/or AE instruction;Paraffin;Passive range of motion;Fluidtherapy;Cryotherapy;Functional Mobility Training;Splinting;Therapeutic activities;Manual Therapy;Moist Heat;Neuromuscular education    Plan continue pulsed [place] for edema, continue A/ROM, P/ROM, strengthening wrist and hand    Consulted and Agree with Plan of Care Patient    Family Member Consulted spouse - Aram Beecham             Patient will benefit from skilled therapeutic intervention in order to improve the following deficits and impairments:   Body Structure / Function / Physical Skills: ADL, UE functional use, Endurance, Balance, Flexibility, Pain, FMC, ROM, Coordination, GMC, Decreased knowledge of precautions, Sensation, Scar mobility, Decreased knowledge of use of DME, IADL, Skin integrity, Dexterity, Strength, Mobility, Edema       Visit Diagnosis: Stiffness of right wrist, not elsewhere classified  Pain in right wrist  Localized edema  Muscle weakness (generalized)  Other lack of coordination    Problem List Patient Active Problem List   Diagnosis Date Noted   Fall 07/09/2021    Collier Salina, OT 10/07/2021, 5:27 PM  [facility] [address] [place], [place], [address] Phone: [phone]   Fax:  [phone]  Name: Craig Church MRN: [id number] Date of Birth: Jul 13, 1966

## 2021-10-07 NOTE — Progress Notes (Signed)
° °  Referring Provider April Manson, NP [phone]  [place] [address]   CC:  Chief Complaint  Patient presents with   Follow-up      Craig Church is an 55 y.o. male.  HPI: Patient presents just shy of 3 months postop from open reduction internal fixation of right distal radius fracture.  Overall he still working with therapy.  He feels as though he has not made as much progress as he would like.  He still has stiffness in his fingers and limited function in his hand and wrist.  Review of Systems General: Denies fevers and chills  Physical Exam Vitals with BMI 07/20/2021 07/19/2021 07/19/2021  Height - - -  Weight - - -  BMI - - -  Systolic 120 124 771  Diastolic 74 74 67  Pulse 59 66 89    General:  No acute distress,  Alert and oriented, Non-Toxic, Normal speech and affect Right hand: Fingers well-perfused normal cap refill palp radial pulse.  Sensation is in tact throughout.  He has intact flexion extension of all fingers and thumb.  He is pretty stiff at his MP joints which is contributing to most of his finger functional impedance.  He does still appear to be stiff in his wrist as well.  He has intact intrinsic musculature.  Recent x-ray was reviewed.  Overall this looks to be healing nicely.  The alignment of the fracture segments is good and hardware placement seems appropriate.  Assessment/Plan Patient presents decide 3 months out from open reduction internal fixation of a right wrist fracture.  For the time being I will recommend continuing with therapy.  I do not see a good indication for any additional procedural intervention for him.  I would also hope that his function improves with continued therapy.  He is in agreement.  We will plan to see him in 6 weeks.  All of his questions were answered.  Allena Napoleon 10/07/2021, 3:30 PM

## 2021-10-12 ENCOUNTER — Ambulatory Visit: Payer: Worker's Compensation | Admitting: Occupational Therapy

## 2021-10-12 ENCOUNTER — Other Ambulatory Visit: Payer: Self-pay

## 2021-10-12 DIAGNOSIS — M25631 Stiffness of right wrist, not elsewhere classified: Secondary | ICD-10-CM

## 2021-10-12 DIAGNOSIS — R278 Other lack of coordination: Secondary | ICD-10-CM

## 2021-10-12 DIAGNOSIS — M6281 Muscle weakness (generalized): Secondary | ICD-10-CM

## 2021-10-12 DIAGNOSIS — R600 Localized edema: Secondary | ICD-10-CM

## 2021-10-12 DIAGNOSIS — M25531 Pain in right wrist: Secondary | ICD-10-CM

## 2021-10-12 NOTE — Therapy (Signed)
[facility] [address] [place], [place], [address] Phone: [phone]   Fax:  [phone]  Occupational Therapy Treatment  Patient Details  Name: Craig Church MRN: [id number] Date of Birth: [birth year] Referring Provider (OT): Altha Harm PA-C   Encounter Date: 10/12/2021   OT End of Session - 10/12/21 1419     Visit Number 13    Number of Visits 25    Date for OT Re-Evaluation 10/27/21    Authorization Type workers comp    Authorization - Visit Number 13    Authorization - Number of Visits 16    OT Start Time 1350    OT Stop Time 1444    OT Time Calculation (min) 54 min    Activity Tolerance Patient tolerated treatment well    Behavior During Therapy WFL for tasks assessed/performed             Past Medical History:  Diagnosis Date   Hypertension    Seizure disorder (HCC)     Past Surgical History:  Procedure Laterality Date   CHOLECYSTECTOMY     OPEN REDUCTION INTERNAL FIXATION (ORIF) DISTAL PHALANX Right 07/12/2021   Procedure: OPEN REDUCTION INTERNAL FIXATION (ORIF) DISTAL RADIUS;  Surgeon: Allena Napoleon, MD;  Location: [facility] OR;  Service: Plastics;  Laterality: Right;    There were no vitals filed for this visit.   Subjective Assessment - 10/12/21 1456     Subjective  Pt reports overdoing it at home adjusting a broken pipe    Pertinent History 55 yo male with onset of fall at work off a ladder was brought to hosp on 9/23 and received ORIF for R distal radial fracture on 9/26.  Mildly displaced R ulnar styloid fracture not requiring sx.  Received TLSO for spinal injuries of T12 comp fx, T11 anterior wedging, B inferior articular process and spinous process fractures.  PMHx:  HA, colloid cyst of brain, idiopathic intracranial hypertension, LE edema, lumbar spondylosis and radiculopathy, chestpain, HLD, seizure disorder.    Limitations cleared for ROM and strengthening per MD, back brace on, NWB  through wrist    Patient Stated Goals custom splint    Currently in Pain? Yes    Pain Score 6     Pain Location Hand    Pain Orientation Right    Pain Type Chronic pain    Pain Onset More than a month ago    Pain Frequency Intermittent    Aggravating Factors  overuse    Pain Relieving Factors rest, [place], paraffin                             Wrist Exercises   Other wrist exercises weighted stretch for wrist flexion and extension w/ 3# dumbbell, and active wrist flexion and extension, supination/ pronation  and then wrist flexion/ extension with 1 lbs weight- pt unable to perform weighted wrist supination/ pronation due to pain and ulnar fx     Hand Exercises   Other Hand Exercises MP flexion, reverse blocking, , Composite flexion - passive to active - patient able to touch 3rd finger palm to palm surface following stretch. Finger thumb opposition     Modalities   Modalities Ultrasound      Ultrasound   Ultrasound Location palm, digits, dorsum of hand    Ultrasound Parameters , 50%, 0.8 w/cm2 x 8 min volar palm, and 5 min dorsum hand    Ultrasound Goals Edema;Pain  Manual Therapy   Passive ROM Digits for composite flexion/extension, wrist in passive extension prayer stretch      End of session, therapist wrapped hand in coban in fisted position, paraffin x 8 mins, no adverse reactions. Increased A/ROM finer flexion and extension, pain decreased to 4/10.        OT Short Term Goals - 10/07/21 1726       OT SHORT TERM GOAL #1   Title I with splint wear, care and precautions    Time 6    Period Weeks    Status Achieved    Target Date 09/15/21      OT SHORT TERM GOAL #2   Title I with inital HEP    Time 6    Period Weeks    Status Achieved      OT SHORT TERM GOAL #3   Title Pt will demonstrate ability to perfrom at least 90% composite finger flexion in prep for functional use.    Time 6    Period Weeks    Status On-going   75-80%     OT  SHORT TERM GOAL #4   Title Pt will demonstrate wrist flexion/ extension of at least 40* in prep for functional use.    Time 6    Period Weeks    Status On-going   40 for flexion, 25 for extension     OT SHORT TERM GOAL #5   Title I with edema control.    Time 6    Period Weeks    Status Achieved               OT Long Term Goals - 10/12/21 1424       OT LONG TERM GOAL #1   Title I with updated HEP.    Time 12    Period Weeks    Status On-going    Target Date 10/27/21      OT LONG TERM GOAL #2   Title Pt will demonstrate grip strength of at least 40 lbs in prep for work activities.    Time 12    Period Weeks    Status On-going      OT LONG TERM GOAL #3   Title Pt will demonstrate wrist flexion/ extension WFLS for work activities.    Time 12    Period Weeks    Status On-going      OT LONG TERM GOAL #4   Title Pt will demonstrate forearm supination/ pronation of at least 85* in prep for work.    Time 12    Period Weeks    Status On-going      OT LONG TERM GOAL #5   Title Pt will resume use of RUE at least 90% of the time for ADLS/ IADLS with pain no greater than 3/10.    Time 12    Period Weeks    Status On-going      OT LONG TERM GOAL #6   Title Pt will perform simulated work activities modified independently    Time 12    Period Weeks    Status On-going                   Plan - 10/12/21 1422     Clinical Impression Statement Pt continues to show slow but steady improvement of range of motion in digits and wrist. Pt continues to be limited by edema.    OT Occupational Profile and History Detailed Assessment- Review of Records  and additional review of physical, cognitive, psychosocial history related to current functional performance    Occupational performance deficits (Please refer to evaluation for details): ADL's;IADL's;Work;Social Participation    Body Structure / Function / Physical Skills ADL;UE functional  use;Endurance;Balance;Flexibility;Pain;FMC;ROM;Coordination;GMC;Decreased knowledge of precautions;Sensation;Scar mobility;Decreased knowledge of use of DME;IADL;Skin integrity;Dexterity;Strength;Mobility;Edema    Rehab Potential Good    Clinical Decision Making Limited treatment options, no task modification necessary    Comorbidities Affecting Occupational Performance: May have comorbidities impacting occupational performance    Modification or Assistance to Complete Evaluation  Min-Moderate modification of tasks or assist with assess necessary to complete eval    OT Frequency 2x / week    OT Duration 12 weeks   plus eval, anticipate d/c after 8-10 weeks dependent upon progress   OT Treatment/Interventions Self-care/ADL training;Ultrasound;Patient/family education;Scar mobilization;DME and/or AE instruction;Paraffin;Passive range of motion;Fluidtherapy;Cryotherapy;Functional Mobility Training;Splinting;Therapeutic activities;Manual Therapy;Moist Heat;Neuromuscular education    Plan continue pulsed [place] for edema, continue A/ROM, P/ROM, strengthening wrist and hand, wrap hand in coban dip in paraffin    Consulted and Agree with Plan of Care Patient             Patient will benefit from skilled therapeutic intervention in order to improve the following deficits and impairments:   Body Structure / Function / Physical Skills: ADL, UE functional use, Endurance, Balance, Flexibility, Pain, FMC, ROM, Coordination, GMC, Decreased knowledge of precautions, Sensation, Scar mobility, Decreased knowledge of use of DME, IADL, Skin integrity, Dexterity, Strength, Mobility, Edema       Visit Diagnosis: Stiffness of right wrist, not elsewhere classified  Pain in right wrist  Localized edema  Muscle weakness (generalized)  Other lack of coordination    Problem List Patient Active Problem List   Diagnosis Date Noted   Fall 07/09/2021    Mikena Masoner, OT 10/12/2021, 2:57 PM  [facility] [address] [place], [place], [address] Phone: [phone]   Fax:  [phone]  Name: Craig Church MRN: [id number] Date of Birth: 1966/06/03

## 2021-10-14 ENCOUNTER — Ambulatory Visit: Payer: Worker's Compensation | Admitting: Occupational Therapy

## 2021-10-14 ENCOUNTER — Encounter: Payer: Self-pay | Admitting: Occupational Therapy

## 2021-10-14 ENCOUNTER — Other Ambulatory Visit: Payer: Self-pay

## 2021-10-14 DIAGNOSIS — M25631 Stiffness of right wrist, not elsewhere classified: Secondary | ICD-10-CM | POA: Diagnosis not present

## 2021-10-14 DIAGNOSIS — R600 Localized edema: Secondary | ICD-10-CM

## 2021-10-14 DIAGNOSIS — R278 Other lack of coordination: Secondary | ICD-10-CM

## 2021-10-14 DIAGNOSIS — M6281 Muscle weakness (generalized): Secondary | ICD-10-CM

## 2021-10-14 NOTE — Therapy (Signed)
[facility] [address] [place], [place], [address] Phone: [phone]   Fax:  [phone]  Occupational Therapy Treatment  Patient Details  Name: Craig Church MRN: [id number] Date of Birth: April 09, 1966 Referring Provider (OT): Altha Harm PA-C   Encounter Date: 10/14/2021   OT End of Session - 10/14/21 1429     Visit Number 14    Number of Visits 25    Date for OT Re-Evaluation 10/27/21    Authorization Type workers comp    Authorization - Visit Number 14    Authorization - Number of Visits 16    Activity Tolerance Patient tolerated treatment well    Behavior During Therapy WFL for tasks assessed/performed             Past Medical History:  Diagnosis Date   Hypertension    Seizure disorder (HCC)     Past Surgical History:  Procedure Laterality Date   CHOLECYSTECTOMY     OPEN REDUCTION INTERNAL FIXATION (ORIF) DISTAL PHALANX Right 07/12/2021   Procedure: OPEN REDUCTION INTERNAL FIXATION (ORIF) DISTAL RADIUS;  Surgeon: Allena Napoleon, MD;  Location: [facility] OR;  Service: Plastics;  Laterality: Right;    There were no vitals filed for this visit.   Subjective Assessment - 10/14/21 1420     Pertinent History 55 yo male with onset of fall at work off a ladder was brought to hosp on 9/23 and received ORIF for R distal radial fracture on 9/26.  Mildly displaced R ulnar styloid fracture not requiring sx.  Received TLSO for spinal injuries of T12 comp fx, T11 anterior wedging, B inferior articular process and spinous process fractures.  PMHx:  HA, colloid cyst of brain, idiopathic intracranial hypertension, LE edema, lumbar spondylosis and radiculopathy, chestpain, HLD, seizure disorder.    Limitations cleared for ROM and strengthening per MD, back brace on, NWB through wrist    Patient Stated Goals custom splint    Currently in Pain? Yes    Pain Score 3     Pain Location Hand    Pain Orientation Right    Pain  Descriptors / Indicators Aching    Pain Type Chronic pain    Pain Onset More than a month ago    Aggravating Factors  overuse    Pain Relieving Factors heat, paraffin                          Wrist Exercises   Other wrist exercises Prayer stretch, weighted stretch for wrist flexion and extension w/ 3# dumbbell, and active wrist flexion and extension, supination/ pronation  and then wrist flexion/ extension with 1 lbs weight- pt unable to perform weighted wrist supination/ pronation due to pain and ulnar fx, pt performed A/ROM supination/ pronation     Hand Exercises   Other Hand Exercises MP flexion, reverse blocking, , Composite flexion - passive to active - patient able to perform at least 90% composite finger flexion  Yellow putty exercises for composite flexion and tip pinch                 Paraffin  8 mins to RUE with hand wrapped in coban fist, no adverse reactions             Manual Therapy   Passive ROM Digits for composite flexion/extension, wrist in passive extension prayer stretch  OT Short Term Goals - 10/14/21 1438       OT SHORT TERM GOAL #1   Title I with splint wear, care and precautions    Time 6    Period Weeks    Status Achieved    Target Date 09/15/21      OT SHORT TERM GOAL #2   Title I with inital HEP    Time 6    Period Weeks    Status Achieved      OT SHORT TERM GOAL #3   Title Pt will demonstrate ability to perfrom at least 90% composite finger flexion in prep for functional use.    Time 6    Period Weeks    Status Achieved   90%     OT SHORT TERM GOAL #4   Title Pt will demonstrate wrist flexion/ extension of at least 40* in prep for functional use.    Time 6    Period Weeks    Status On-going   40 for flexion, 25 for extension     OT SHORT TERM GOAL #5   Title I with edema control.    Time 6    Period Weeks    Status Achieved               OT Long Term Goals - 10/14/21 1438        OT LONG TERM GOAL #1   Title I with updated HEP.    Time 12    Period Weeks    Status On-going    Target Date 10/27/21      OT LONG TERM GOAL #2   Title Pt will demonstrate grip strength of at least 40 lbs in prep for work activities.    Time 12    Period Weeks    Status On-going      OT LONG TERM GOAL #3   Title Pt will demonstrate wrist flexion/ extension WFLS for work activities.    Time 12    Period Weeks    Status On-going      OT LONG TERM GOAL #4   Title Pt will demonstrate forearm supination/ pronation of at least 85* in prep for work.    Time 12    Period Weeks    Status On-going      OT LONG TERM GOAL #5   Title Pt will resume use of RUE at least 90% of the time for ADLS/ IADLS with pain no greater than 3/10.    Time 12    Period Weeks    Status On-going      OT LONG TERM GOAL #6   Title Pt will perform simulated work activities modified independently    Time 12    Period Weeks    Status On-going                   Plan - 10/14/21 1436     Clinical Impression Statement Pt is now able to make at least 90% composite flexion for fist . Pt continues to demonstrate improving strength and ROM.    OT Occupational Profile and History Detailed Assessment- Review of Records and additional review of physical, cognitive, psychosocial history related to current functional performance    Occupational performance deficits (Please refer to evaluation for details): ADL's;IADL's;Work;Social Participation    Body Structure / Function / Physical Skills ADL;UE functional use;Endurance;Balance;Flexibility;Pain;FMC;ROM;Coordination;GMC;Decreased knowledge of precautions;Sensation;Scar mobility;Decreased knowledge of use of DME;IADL;Skin integrity;Dexterity;Strength;Mobility;Edema    Rehab Potential Good  Clinical Decision Making Limited treatment options, no task modification necessary    Comorbidities Affecting Occupational Performance: May have comorbidities  impacting occupational performance    Modification or Assistance to Complete Evaluation  Min-Moderate modification of tasks or assist with assess necessary to complete eval    OT Frequency 2x / week    OT Duration 12 weeks   plus eval, anticipate d/c after 8-10 weeks dependent upon progress   OT Treatment/Interventions Self-care/ADL training;Ultrasound;Patient/family education;Scar mobilization;DME and/or AE instruction;Paraffin;Passive range of motion;Fluidtherapy;Cryotherapy;Functional Mobility Training;Splinting;Therapeutic activities;Manual Therapy;Moist Heat;Neuromuscular education    Plan continue pulsed [place] for edema, continue A/ROM, P/ROM, strengthening wrist and hand, wrap hand in coban dip in paraffin    Consulted and Agree with Plan of Care Patient             Patient will benefit from skilled therapeutic intervention in order to improve the following deficits and impairments:   Body Structure / Function / Physical Skills: ADL, UE functional use, Endurance, Balance, Flexibility, Pain, FMC, ROM, Coordination, GMC, Decreased knowledge of precautions, Sensation, Scar mobility, Decreased knowledge of use of DME, IADL, Skin integrity, Dexterity, Strength, Mobility, Edema       Visit Diagnosis: Stiffness of right wrist, not elsewhere classified  Localized edema  Muscle weakness (generalized)  Other lack of coordination    Problem List Patient Active Problem List   Diagnosis Date Noted   Fall 07/09/2021    Craig Church, OT 10/14/2021, 2:43 PM  [facility] [address] [place], [place], [address] Phone: [phone]   Fax:  [phone]  Name: Craig Church MRN: [id number] Date of Birth: 1966/02/08

## 2021-10-19 ENCOUNTER — Ambulatory Visit: Payer: Worker's Compensation | Attending: Family Medicine | Admitting: Occupational Therapy

## 2021-10-19 ENCOUNTER — Other Ambulatory Visit: Payer: Self-pay

## 2021-10-19 DIAGNOSIS — M25631 Stiffness of right wrist, not elsewhere classified: Secondary | ICD-10-CM | POA: Diagnosis present

## 2021-10-19 DIAGNOSIS — M6281 Muscle weakness (generalized): Secondary | ICD-10-CM | POA: Diagnosis present

## 2021-10-19 DIAGNOSIS — R600 Localized edema: Secondary | ICD-10-CM | POA: Insufficient documentation

## 2021-10-19 DIAGNOSIS — R2689 Other abnormalities of gait and mobility: Secondary | ICD-10-CM | POA: Diagnosis present

## 2021-10-19 DIAGNOSIS — M25531 Pain in right wrist: Secondary | ICD-10-CM | POA: Diagnosis present

## 2021-10-19 DIAGNOSIS — R278 Other lack of coordination: Secondary | ICD-10-CM | POA: Diagnosis present

## 2021-10-19 NOTE — Therapy (Signed)
[facility] [address] [place], [place], [address] Phone: [phone]   Fax:  [phone]  Occupational Therapy Treatment  Patient Details  Name: Craig Church MRN: [id number] Date of Birth: [birth year] Referring Provider (OT): Altha Harm PA-C   Encounter Date: 10/19/2021   OT End of Session - 10/19/21 1441     Visit Number 15    Number of Visits 25    Date for OT Re-Evaluation 10/27/21    Authorization Type workers comp additional 8 visits approved following the inital 16 visits 16+8=24    Authorization - Visit Number 15    Authorization - Number of Visits 24    OT Start Time 1403    OT Stop Time 1445    OT Time Calculation (min) 42 min    Activity Tolerance Patient tolerated treatment well    Behavior During Therapy [facility] for tasks assessed/performed             Past Medical History:  Diagnosis Date   Hypertension    Seizure disorder (HCC)     Past Surgical History:  Procedure Laterality Date   CHOLECYSTECTOMY     OPEN REDUCTION INTERNAL FIXATION (ORIF) DISTAL PHALANX Right 07/12/2021   Procedure: OPEN REDUCTION INTERNAL FIXATION (ORIF) DISTAL RADIUS;  Surgeon: Allena Napoleon, MD;  Location: [facility] OR;  Service: Plastics;  Laterality: Right;    There were no vitals filed for this visit.   Subjective Assessment - 10/19/21 1545     Pertinent History 56 yo male with onset of fall at work off a ladder was brought to hosp on 9/23 and received ORIF for R distal radial fracture on 9/26.  Mildly displaced R ulnar styloid fracture not requiring sx.  Received TLSO for spinal injuries of T12 comp fx, T11 anterior wedging, B inferior articular process and spinous process fractures.  PMHx:  HA, colloid cyst of brain, idiopathic intracranial hypertension, LE edema, lumbar spondylosis and radiculopathy, chestpain, HLD, seizure disorder.    Limitations cleared for ROM and strengthening per MD, back brace on,    Patient  Stated Goals custom splint    Currently in Pain? Yes    Pain Score 4     Pain Location Hand    Pain Orientation Right    Pain Descriptors / Indicators Aching    Pain Type Chronic pain    Pain Onset More than a month ago    Pain Frequency Intermittent    Aggravating Factors  overuse    Pain Relieving Factors heat, paraffin                       Wrist Exercises   Other wrist exercises wrist flexion/ extension with 1 lbs weight- A/ROM supination/ pronation without weight     Hand Exercises   Other Hand Exercises MP flexion, finger abduction / adduction, Composite flexion - passive to place an hold -  Finger thumb opposition     Modalities   Modalities Ultrasound , Paraffin     Ultrasound   Ultrasound Location palm, digits, dorsum of hand    Ultrasound Parameters , 50%, 0.8 w/cm2 x 6 min volar palm, and 4 min dorsum hand    Ultrasound Goals Edema;Pain      Manual Therapy   Passive ROM Digits for composite flexion/extension, wrist in passive flexion/ extension stretch     Therapist wrapped hand in coban in fisted position, paraffin x 8 mins, no adverse reactions. Increased A/ROM finger flexion  and extension,following use of paraffin, individual passive stretch to digits in composite and MP flexion.                OT Short Term Goals - 10/14/21 1438       OT SHORT TERM GOAL #1   Title I with splint wear, care and precautions    Time 6    Period Weeks    Status Achieved    Target Date 09/15/21      OT SHORT TERM GOAL #2   Title I with inital HEP    Time 6    Period Weeks    Status Achieved      OT SHORT TERM GOAL #3   Title Pt will demonstrate ability to perfrom at least 90% composite finger flexion in prep for functional use.    Time 6    Period Weeks    Status Achieved   90%     OT SHORT TERM GOAL #4   Title Pt will demonstrate wrist flexion/ extension of at least 40* in prep for functional use.    Time 6    Period Weeks    Status  On-going   40 for flexion, 25 for extension     OT SHORT TERM GOAL #5   Title I with edema control.    Time 6    Period Weeks    Status Achieved               OT Long Term Goals - 10/14/21 1438       OT LONG TERM GOAL #1   Title I with updated HEP.    Time 12    Period Weeks    Status On-going    Target Date 10/27/21      OT LONG TERM GOAL #2   Title Pt will demonstrate grip strength of at least 40 lbs in prep for work activities.    Time 12    Period Weeks    Status On-going      OT LONG TERM GOAL #3   Title Pt will demonstrate wrist flexion/ extension WFLS for work activities.    Time 12    Period Weeks    Status On-going      OT LONG TERM GOAL #4   Title Pt will demonstrate forearm supination/ pronation of at least 85* in prep for work.    Time 12    Period Weeks    Status On-going      OT LONG TERM GOAL #5   Title Pt will resume use of RUE at least 90% of the time for ADLS/ IADLS with pain no greater than 3/10.    Time 12    Period Weeks    Status On-going      OT LONG TERM GOAL #6   Title Pt will perform simulated work activities modified independently    Time 12    Period Weeks    Status On-going                   Plan - 10/19/21 1544     Clinical Impression Statement Pt continues to demonstrate slow but steady  improving strength and ROM.    OT Occupational Profile and History Detailed Assessment- Review of Records and additional review of physical, cognitive, psychosocial history related to current functional performance    Occupational performance deficits (Please refer to evaluation for details): ADL's;IADL's;Work;Social Participation    Body Structure / Function / Physical Skills  ADL;UE functional use;Endurance;Balance;Flexibility;Pain;FMC;ROM;Coordination;GMC;Decreased knowledge of precautions;Sensation;Scar mobility;Decreased knowledge of use of DME;IADL;Skin integrity;Dexterity;Strength;Mobility;Edema    Rehab Potential Good     Clinical Decision Making Limited treatment options, no task modification necessary    Comorbidities Affecting Occupational Performance: May have comorbidities impacting occupational performance    Modification or Assistance to Complete Evaluation  Min-Moderate modification of tasks or assist with assess necessary to complete eval    OT Frequency 2x / week    OT Duration 12 weeks   plus eval, anticipate d/c after 8-10 weeks dependent upon progress   OT Treatment/Interventions Self-care/ADL training;Ultrasound;Patient/family education;Scar mobilization;DME and/or AE instruction;Paraffin;Passive range of motion;Fluidtherapy;Cryotherapy;Functional Mobility Training;Splinting;Therapeutic activities;Manual Therapy;Moist Heat;Neuromuscular education    Plan continue pulsed US for edema, continue A/ROM, P/ROM, strengthening wrist and hand, wrap hand in coban dip in paraffin    Consulted and Agree with Plan of Care Patient             Patient will benefit from skilled therapeutic intervention in order to improve the following deficits and impairments:   Body Structure / Function / Physical Skills: ADL, UE functional use, Endurance, Balance, Flexibility, Pain, FMC, ROM, Coordination, GMC, Decreased knowledge of precautions, Sensation, Scar mobility, Decreased knowledge of use of DME, IADL, Skin integrity, Dexterity, Strength, Mobility, Edema       Visit Diagnosis: Stiffness of right wrist, not elsewhere classified  Localized edema  Muscle weakness (generalized)  Other lack of coordination  Pain in right wrist    Problem List Patient Active Problem List   Diagnosis Date Noted   Fall 07/09/2021    Vittoria Noreen, OT 10/19/2021, 3:46 PM Keene BreathKathryn Theophil Thivierge, OTR/L [phone] Phone: [phone] 3:50 PM 10/19/21  [facility] [address] [place], [place], [address] Phone: [phone]   Fax:  [phone]  Name: Craig Church MRN: [id number] Date of Birth: Dec 16, 1965

## 2021-10-21 ENCOUNTER — Other Ambulatory Visit: Payer: Self-pay

## 2021-10-21 ENCOUNTER — Encounter: Payer: Self-pay | Admitting: Occupational Therapy

## 2021-10-21 ENCOUNTER — Ambulatory Visit: Payer: Worker's Compensation | Admitting: Occupational Therapy

## 2021-10-21 DIAGNOSIS — R278 Other lack of coordination: Secondary | ICD-10-CM

## 2021-10-21 DIAGNOSIS — M25631 Stiffness of right wrist, not elsewhere classified: Secondary | ICD-10-CM

## 2021-10-21 DIAGNOSIS — M25531 Pain in right wrist: Secondary | ICD-10-CM

## 2021-10-21 DIAGNOSIS — M6281 Muscle weakness (generalized): Secondary | ICD-10-CM

## 2021-10-21 DIAGNOSIS — R600 Localized edema: Secondary | ICD-10-CM

## 2021-10-21 DIAGNOSIS — R2689 Other abnormalities of gait and mobility: Secondary | ICD-10-CM

## 2021-10-21 NOTE — Patient Instructions (Signed)
Sit at a table and perform the following:  Flip playing card by turning your palm up  Hold a deck of cards and deal with your thumb  Practice flicking cards with your right hand towards the goal  Rotate a tennis ball in your fingertips  Toss a tennis ball between your hands  Make a stack of pennies, then pick up the stack and push the pennies out of your hand 1 at a time to put in a container

## 2021-10-21 NOTE — Therapy (Signed)
[facility] [address] [place], [place], [address] Phone: [phone]   Fax:  [phone]  Occupational Therapy Treatment  Patient Details  Name: Craig Church MRN: [id number] Date of Birth: [birth year] Referring Provider (OT): Sharyn Lull PA-C   Encounter Date: 10/21/2021   OT End of Session - 10/21/21 1413     Visit Number 16    Number of Visits 25    Authorization - Visit Number 16    Authorization - Number of Visits 24    OT Start Time [id number]    OT Stop Time 1445    OT Time Calculation (min) 42 min    Activity Tolerance Patient tolerated treatment well    Behavior During Therapy [facility] for tasks assessed/performed             Past Medical History:  Diagnosis Date   Hypertension    Seizure disorder ([place])     Past Surgical History:  Procedure Laterality Date   CHOLECYSTECTOMY     OPEN REDUCTION INTERNAL FIXATION (ORIF) DISTAL PHALANX Right 07/12/2021   Procedure: OPEN REDUCTION INTERNAL FIXATION (ORIF) DISTAL RADIUS;  Surgeon: Cindra Presume, MD;  Location: [facility] [facility];  Service: Plastics;  Laterality: Right;    There were no vitals filed for this visit.   Subjective Assessment - 10/21/21 1412     Subjective  Pt reports hand pain    Pertinent History 56 yo male with onset of fall at work off a ladder was brought to [place] on 9/23 and received ORIF for R distal radial fracture on 9/26.  Mildly displaced R ulnar styloid fracture not requiring sx.  Received TLSO for spinal injuries of T12 comp fx, T11 anterior wedging, B inferior articular process and spinous process fractures.  PMHx:  HA, colloid cyst of brain, idiopathic intracranial hypertension, LE edema, lumbar spondylosis and radiculopathy, chestpain, HLD, seizure disorder.    Currently in Pain? Yes    Pain Score 4     Pain Location Hand    Pain Orientation Right    Pain Descriptors / Indicators Aching    Pain Type Chronic pain    Pain Onset More than a  month ago    Pain Frequency Intermittent    Aggravating Factors  overuse    Pain Relieving Factors heat, paraffin              Treatment: Paraffin x 8 mins to R hand wrapped in coban and wrist for pain and stiffness, no adverse reactions. A/ROM composite finger flexion/ extension, finger abduction/ adduction, MP flexion, composite place and hold and reverse blocking followed by forearm gym x 3 reps  for increased A/ROM Passive stretch in flexion and extension 2 reps each direction with 3 lbs weight A/ROM wrist flexion/ extension with 1 lbs weight                      OT Education - 10/21/21 1457     Education Details coordination HEP for functional use of RUE,see pt instructions,  yellow putty exercises for grip pinch and thumb flexion, precautions due to sensory impairment in RUE and limited dexterity as pt reports burning LUE trying to cook    Person(s) Educated Patient    Methods Explanation;Demonstration;Verbal cues;Handout    Comprehension Verbalized understanding;Returned demonstration;Verbal cues required              OT Short Term Goals - 10/14/21 1438       OT SHORT TERM GOAL #  1   Title I with splint wear, care and precautions    Time 6    Period Weeks    Status Achieved    Target Date 09/15/21      OT SHORT TERM GOAL #2   Title I with inital HEP    Time 6    Period Weeks    Status Achieved      OT SHORT TERM GOAL #3   Title Pt will demonstrate ability to perfrom at least 90% composite finger flexion in prep for functional use.    Time 6    Period Weeks    Status Achieved   90%     OT SHORT TERM GOAL #4   Title Pt will demonstrate wrist flexion/ extension of at least 40* in prep for functional use.    Time 6    Period Weeks    Status On-going   40 for flexion, 25 for extension     OT SHORT TERM GOAL #5   Title I with edema control.    Time 6    Period Weeks    Status Achieved               OT Long Term Goals - 10/14/21  1438       OT LONG TERM GOAL #1   Title I with updated HEP.    Time 12    Period Weeks    Status On-going    Target Date 10/27/21      OT LONG TERM GOAL #2   Title Pt will demonstrate grip strength of at least 40 lbs in prep for work activities.    Time 12    Period Weeks    Status On-going      OT LONG TERM GOAL #3   Title Pt will demonstrate wrist flexion/ extension WFLS for work activities.    Time 12    Period Weeks    Status On-going      OT LONG TERM GOAL #4   Title Pt will demonstrate forearm supination/ pronation of at least 85* in prep for work.    Time 12    Period Weeks    Status On-going      OT LONG TERM GOAL #5   Title Pt will resume use of RUE at least 90% of the time for ADLS/ IADLS with pain no greater than 3/10.    Time 12    Period Weeks    Status On-going      OT LONG TERM GOAL #6   Title Pt will perform simulated work activities modified independently    Time 12    Period Weeks    Status On-going                   Plan - 10/21/21 1413     Clinical Impression Statement Pt continues to progress slowly towards goals. Pt reports concerns over limited ability to use RUE functionally. Pt was issued a coordination HEP to address this.    OT Occupational Profile and History Detailed Assessment- Review of Records and additional review of physical, cognitive, psychosocial history related to current functional performance    Occupational performance deficits (Please refer to evaluation for details): ADL's;IADL's;Work;Social Participation    Body Structure / Function / Physical Skills ADL;UE functional use;Endurance;Balance;Flexibility;Pain;FMC;ROM;Coordination;GMC;Decreased knowledge of precautions;Sensation;Scar mobility;Decreased knowledge of use of DME;IADL;Skin integrity;Dexterity;Strength;Mobility;Edema    Rehab Potential Good    Clinical Decision Making Limited treatment options, no task modification necessary  Comorbidities Affecting  Occupational Performance: May have comorbidities impacting occupational performance    Modification or Assistance to Complete Evaluation  Min-Moderate modification of tasks or assist with assess necessary to complete eval    OT Frequency 2x / week    OT Duration 12 weeks   plus eval, anticipate d/c after 8-10 weeks dependent upon progress   OT Treatment/Interventions Self-care/ADL training;Ultrasound;Patient/family education;Scar mobilization;DME and/or AE instruction;Paraffin;Passive range of motion;Fluidtherapy;Cryotherapy;Functional Mobility Training;Splinting;Therapeutic activities;Manual Therapy;Moist Heat;Neuromuscular education    Plan continue A/ROM, P/ROM, functional use, strengthening wrist and hand, wrap hand in coban dip in paraffin    Consulted and Agree with Plan of Care Patient             Patient will benefit from skilled therapeutic intervention in order to improve the following deficits and impairments:   Body Structure / Function / Physical Skills: ADL, UE functional use, Endurance, Balance, Flexibility, Pain, FMC, ROM, Coordination, GMC, Decreased knowledge of precautions, Sensation, Scar mobility, Decreased knowledge of use of DME, IADL, Skin integrity, Dexterity, Strength, Mobility, Edema       Visit Diagnosis: Stiffness of right wrist, not elsewhere classified  Muscle weakness (generalized)  Other lack of coordination  Pain in right wrist  Other abnormalities of gait and mobility  Localized edema    Problem List Patient Active Problem List   Diagnosis Date Noted   Fall 07/09/2021    Tionna Gigante, OT 10/21/2021, 3:06 PM  [facility] [address] [place], [place], [address] Phone: [phone]   Fax:  [phone]  Name: Craig Church MRN: [id number] Date of Birth: [birth year]

## 2021-10-26 ENCOUNTER — Ambulatory Visit: Payer: Worker's Compensation | Admitting: Occupational Therapy

## 2021-10-26 ENCOUNTER — Other Ambulatory Visit: Payer: Self-pay

## 2021-10-26 DIAGNOSIS — M25631 Stiffness of right wrist, not elsewhere classified: Secondary | ICD-10-CM | POA: Diagnosis not present

## 2021-10-26 DIAGNOSIS — M25531 Pain in right wrist: Secondary | ICD-10-CM

## 2021-10-26 DIAGNOSIS — M6281 Muscle weakness (generalized): Secondary | ICD-10-CM

## 2021-10-26 DIAGNOSIS — R2689 Other abnormalities of gait and mobility: Secondary | ICD-10-CM

## 2021-10-26 DIAGNOSIS — R278 Other lack of coordination: Secondary | ICD-10-CM

## 2021-10-26 DIAGNOSIS — R600 Localized edema: Secondary | ICD-10-CM

## 2021-10-27 NOTE — Therapy (Signed)
[facility] [address] [place], [place], [address] Phone: [phone]   Fax:  [phone]  Occupational Therapy Treatment  Patient Details  Name: Craig Church MRN: [id number] Date of Birth: 26-Nov-1965 Referring Provider (OT): Altha Harm PA-C   Encounter Date: 10/26/2021   OT End of Session - 10/26/21 1419     Visit Number 17    Number of Visits 33    Date for OT Re-Evaluation 12/22/21    Authorization Type workers comp additional 8 visits approved following the inital 16 visits 16+8=24    Authorization - Visit Number 17    Authorization - Number of Visits 24    OT Start Time 1401    OT Stop Time 1445    OT Time Calculation (min) 44 min             Past Medical History:  Diagnosis Date   Hypertension    Seizure disorder ([facility])     Past Surgical History:  Procedure Laterality Date   CHOLECYSTECTOMY     OPEN REDUCTION INTERNAL FIXATION (ORIF) DISTAL PHALANX Right 07/12/2021   Procedure: OPEN REDUCTION INTERNAL FIXATION (ORIF) DISTAL RADIUS;  Surgeon: Allena Napoleon, MD;  [facility];  Service: Plastics;  Laterality: Right;    There were no vitals filed for this visit.   Subjective Assessment - 10/26/21 1419     Pertinent History 56 yo male with onset of fall at work off a ladder was brought to hosp on 9/23 and received ORIF for R distal radial fracture on 9/26.  Mildly displaced R ulnar styloid fracture not requiring sx.  Received TLSO for spinal injuries of T12 comp fx, T11 anterior wedging, B inferior articular process and spinous process fractures.  PMHx:  HA, colloid cyst of brain, idiopathic intracranial hypertension, LE edema, lumbar spondylosis and radiculopathy, chestpain, HLD, seizure disorder.    Limitations cleared for ROM and strengthening per MD, back brace on,    Patient Stated Goals custom splint    Currently in Pain? Yes    Pain Score 5     Pain Location Hand    Pain Orientation  Right    Pain Descriptors / Indicators Aching    Pain Type Chronic pain    Pain Onset More than a month ago    Pain Frequency Intermittent    Aggravating Factors  overuse    Pain Relieving Factors heat, paraffin                 Treatment: Paraffin x 10 mins to R hand wrapped in coban and wrist for pain and stiffness, no adverse reactions. A/ROM composite finger flexion/ extension,  MP flexion, composite place and hold and reverse blocking for increased A/ROM Passive stretch in flexion and extension 2 reps each direction with 3 lbs weight A/ROM wrist flexion/ extension with 1 lbs weight Passive flexion to individual digits compositely Therapist checked progress towards goals Digi flex individually and compositely  for finger flexion yellow                     OT Short Term Goals - 10/26/21 1434       OT SHORT TERM GOAL #1   Title I with splint wear, care and precautions    Time 6    Period Weeks    Status Achieved    Target Date 09/15/21      OT SHORT TERM GOAL #2   Title I with inital HEP  Time 6    Period Weeks    Status Achieved      OT SHORT TERM GOAL #3   Title Pt will demonstrate ability to perfrom at least 90% composite finger flexion in prep for functional use.    Time 6    Period Weeks    Status Achieved   90%     OT SHORT TERM GOAL #4   Title Pt will demonstrate wrist flexion/ extension of at least 40* in prep for functional use.    Time 6    Period Weeks    Status Achieved   45*/ 40 extension     OT SHORT TERM GOAL #5   Title I with edema control.    Time 6    Period Weeks    Status Achieved      OT SHORT TERM GOAL #6   Title Pt will demonstrate 50* wrist flexion and 45* wrist extension in prep for functional use    Time 4    Period Weeks    Status New      OT SHORT TERM GOAL #7   Title Pt will demonstrate forearm supination/ pronation of 85* in prep for work activities    Time 4    Period Weeks    Status New       OT SHORT TERM GOAL #8   Title Pt will demonstrate grip strength of 23 lbs or greater in prep for functional use.    Time 4    Period Weeks    Status New      OT SHORT TERM GOAL  #9   TITLE Pt will report increased ease with the following tasks: cutting food, opening continers, and squeezing toothpaste.    Time 4    Period Weeks    Status New               OT Long Term Goals - 10/26/21 1420       OT LONG TERM GOAL #1   Title I with updated HEP.    Time 12    Period Weeks    Status On-going    Target Date 10/27/21      OT LONG TERM GOAL #2   Title Pt will demonstrate grip strength of at least 40 lbs in prep for work activities.    Time 8    Period Weeks    Status On-going   18.7     OT LONG TERM GOAL #3   Title Pt will demonstrate wrist flexion/ extension WFLS for work activities.    Time 8    Period Weeks    Status On-going      OT LONG TERM GOAL #4   Title Pt will demonstrate forearm supination/ pronation of at least 85* in prep for work.    Time 12    Period Weeks    Status Revised   80/80- now a short term goal     OT LONG TERM GOAL #5   Title Pt will resume use of RUE at least 90% of the time for ADLS/ IADLS with pain no greater than 3/10.    Time 12    Period Weeks    Status On-going   50% of the time     OT LONG TERM GOAL #6   Title Pt will perform simulated work activities modified independently    Time 12    Period Weeks    Status On-going   unable to perfrom due to  pain and back prec.                  Plan - 10/26/21 1425     Clinical Impression Statement Pt is progressing towards goals. He has achieved all inital short term goals however he has not fully achieved long term goals due to the severity of his injury.Pt can benefit from continued skilled occupational therapy to address the following deficits: deceased ROM, decreased strength, pain, decreased RUE functional use, edema, which impedes performance of ADLS and IADLs.    OT  Occupational Profile and History Detailed Assessment- Review of Records and additional review of physical, cognitive, psychosocial history related to current functional performance    Occupational performance deficits (Please refer to evaluation for details): ADL's;IADL's;Work;Social Participation;Rest and Sleep;Leisure    Body Structure / Function / Physical Skills ADL;UE functional use;Endurance;Balance;Flexibility;Pain;FMC;ROM;Coordination;GMC;Decreased knowledge of precautions;Sensation;Scar mobility;Decreased knowledge of use of DME;IADL;Skin integrity;Dexterity;Strength;Mobility;Edema    Rehab Potential Good    OT Frequency 2x / week    OT Duration 8 weeks    OT Treatment/Interventions Self-care/ADL training;Ultrasound;Patient/family education;Scar mobilization;DME and/or AE instruction;Paraffin;Passive range of motion;Fluidtherapy;Cryotherapy;Functional Mobility Training;Splinting;Therapeutic activities;Manual Therapy;Moist Heat;Neuromuscular education    Plan continue A/ROM, P/ROM, functional use, strengthening wrist and hand, wrap hand in coban dip in paraffin    Consulted and Agree with Plan of Care Patient             Patient will benefit from skilled therapeutic intervention in order to improve the following deficits and impairments:   Body Structure / Function / Physical Skills: ADL, UE functional use, Endurance, Balance, Flexibility, Pain, FMC, ROM, Coordination, GMC, Decreased knowledge of precautions, Sensation, Scar mobility, Decreased knowledge of use of DME, IADL, Skin integrity, Dexterity, Strength, Mobility, Edema       Visit Diagnosis: Stiffness of right wrist, not elsewhere classified  Muscle weakness (generalized)  Other lack of coordination  Pain in right wrist  Other abnormalities of gait and mobility  Localized edema    Problem List Patient Active Problem List   Diagnosis Date Noted   Fall 07/09/2021    Moya Duan, OT 10/27/2021, 7:35  AM Keene Breath, OTR/L [phone] Phone: [phone] 7:52 AM 10/27/21  [facility] [address] [place], [place], [address] Phone: [phone]   Fax:  [phone]  Name: Craig Church MRN: [id number] Date of Birth: Oct 19, 1965

## 2021-10-28 ENCOUNTER — Ambulatory Visit: Payer: Worker's Compensation | Admitting: Occupational Therapy

## 2021-10-28 ENCOUNTER — Other Ambulatory Visit: Payer: Self-pay

## 2021-10-28 DIAGNOSIS — R278 Other lack of coordination: Secondary | ICD-10-CM

## 2021-10-28 DIAGNOSIS — M6281 Muscle weakness (generalized): Secondary | ICD-10-CM

## 2021-10-28 DIAGNOSIS — M25531 Pain in right wrist: Secondary | ICD-10-CM

## 2021-10-28 DIAGNOSIS — M25631 Stiffness of right wrist, not elsewhere classified: Secondary | ICD-10-CM

## 2021-10-28 NOTE — Therapy (Signed)
[facility] [address] [place], [place], [address] Phone: [phone]   Fax:  [phone]  Occupational Therapy Treatment  Patient Details  Name: Craig Church MRN: [id number] Date of Birth: 1966-03-30 Referring Provider (OT): Altha HarmMathew Scheeler PA-C   Encounter Date: 10/28/2021   OT End of Session - 10/28/21 1517     Visit Number 18    Number of Visits 33    Date for OT Re-Evaluation 12/22/21    Authorization Type workers comp additional 8 visits approved following the inital 16 visits 16+8=24    Authorization - Visit Number 18    Authorization - Number of Visits 24    OT Start Time 1404    OT Stop Time 1458    OT Time Calculation (min) 54 min             Past Medical History:  Diagnosis Date   Hypertension    Seizure disorder ([facility])     Past Surgical History:  Procedure Laterality Date   CHOLECYSTECTOMY     OPEN REDUCTION INTERNAL FIXATION (ORIF) DISTAL PHALANX Right 07/12/2021   Procedure: OPEN REDUCTION INTERNAL FIXATION (ORIF) DISTAL RADIUS;  Surgeon: Allena NapoleonPace, Collier S, MD;  [facility];  Service: Plastics;  Laterality: Right;    There were no vitals filed for this visit.   Subjective Assessment - 10/28/21 1520     Subjective  Pt reports hand soreness today    Pertinent History 56 yo male with onset of fall at work off a ladder was brought to hosp on 9/23 and received ORIF for R distal radial fracture on 9/26.  Mildly displaced R ulnar styloid fracture not requiring sx.  Received TLSO for spinal injuries of T12 comp fx, T11 anterior wedging, B inferior articular process and spinous process fractures.  PMHx:  HA, colloid cyst of brain, idiopathic intracranial hypertension, LE edema, lumbar spondylosis and radiculopathy, chestpain, HLD, seizure disorder.    Limitations cleared for ROM and strengthening per MD, back brace on,    Patient Stated Goals custom splint    Currently in Pain? Yes    Pain Score 5      Pain Location Hand    Pain Orientation Right    Pain Descriptors / Indicators Aching    Pain Type Chronic pain    Pain Onset More than a month ago    Pain Frequency Intermittent    Aggravating Factors  overuse    Pain Relieving Factors heat, paraffin                Treatment: US 3mhz, 0.8 w/cm2 20% x 6 mins to palmar surface and MP's then 4 mins to dorsal hand, no adverse reactions. A/ROM composite finger flexion/ extension, Paraffin x 8 mins to R hand wrapped in coban and wrist for pain and stiffness, no adverse reactions.  MP flexion, composite place and hold and reverse blocking for increased A/ROM Passive stretch in flexion and extension 2 reps each direction, A/ROM wrist flexion/ extension and ulnar/ radial deviation A/ROM wrist flexion/ extension  and ulnar radial deviation then with 1 lbs weight ,  10 reps each direction Passive flexion to individual digits compositely, and individually Digi- flex individually and compositely  for finger flexion yellow Placing medium pegs into pegboard with RUE then removing with in hand manipulation.                    OT Short Term Goals - 10/26/21 1434  OT SHORT TERM GOAL #1   Title I with splint wear, care and precautions    Time 6    Period Weeks    Status Achieved    Target Date 09/15/21      OT SHORT TERM GOAL #2   Title I with inital HEP    Time 6    Period Weeks    Status Achieved      OT SHORT TERM GOAL #3   Title Pt will demonstrate ability to perfrom at least 90% composite finger flexion in prep for functional use.    Time 6    Period Weeks    Status Achieved   90%     OT SHORT TERM GOAL #4   Title Pt will demonstrate wrist flexion/ extension of at least 40* in prep for functional use.    Time 6    Period Weeks    Status Achieved   45*/ 40 extension     OT SHORT TERM GOAL #5   Title I with edema control.    Time 6    Period Weeks    Status Achieved      OT SHORT TERM GOAL #6    Title Pt will demonstrate 50* wrist flexion and 45* wrist extension in prep for functional use    Time 4    Period Weeks    Status New      OT SHORT TERM GOAL #7   Title Pt will demonstrate forearm supination/ pronation of 85* in prep for work activities    Time 4    Period Weeks    Status New      OT SHORT TERM GOAL #8   Title Pt will demonstrate grip strength of 23 lbs or greater in prep for functional use.    Time 4    Period Weeks    Status New      OT SHORT TERM GOAL  #9   TITLE Pt will report increased ease with the following tasks: cutting food, opening continers, and squeezing toothpaste.    Time 4    Period Weeks    Status New               OT Long Term Goals - 10/26/21 1420       OT LONG TERM GOAL #1   Title I with updated HEP.    Time 12    Period Weeks    Status On-going    Target Date 10/27/21      OT LONG TERM GOAL #2   Title Pt will demonstrate grip strength of at least 40 lbs in prep for work activities.    Time 8    Period Weeks    Status On-going   18.7     OT LONG TERM GOAL #3   Title Pt will demonstrate wrist flexion/ extension WFLS for work activities.    Time 8    Period Weeks    Status On-going      OT LONG TERM GOAL #4   Title Pt will demonstrate forearm supination/ pronation of at least 85* in prep for work.    Time 12    Period Weeks    Status Revised   80/80- now a short term goal     OT LONG TERM GOAL #5   Title Pt will resume use of RUE at least 90% of the time for ADLS/ IADLS with pain no greater than 3/10.    Time 12  Period Weeks    Status On-going   50% of the time     OT LONG TERM GOAL #6   Title Pt will perform simulated work activities modified independently    Time 12    Period Weeks    Status On-going   unable to perfrom due to pain and back prec.                  Plan - 10/28/21 1513     Clinical Impression Statement Pt is progressing towards goals. He reports soreness today and has likely  beeen overusing his putty. Pt reports using putty 3-4 x day.pt was encouraged to decrease putty use and to focus more on ROM and functional use    OT Occupational Profile and History Detailed Assessment- Review of Records and additional review of physical, cognitive, psychosocial history related to current functional performance    Occupational performance deficits (Please refer to evaluation for details): ADL's;IADL's;Work;Social Participation;Rest and Sleep;Leisure    Body Structure / Function / Physical Skills ADL;UE functional use;Endurance;Balance;Flexibility;Pain;FMC;ROM;Coordination;GMC;Decreased knowledge of precautions;Sensation;Scar mobility;Decreased knowledge of use of DME;IADL;Skin integrity;Dexterity;Strength;Mobility;Edema    Rehab Potential Good    OT Frequency 2x / week    OT Duration 8 weeks    OT Treatment/Interventions Self-care/ADL training;Ultrasound;Patient/family education;Scar mobilization;DME and/or AE instruction;Paraffin;Passive range of motion;Fluidtherapy;Cryotherapy;Functional Mobility Training;Splinting;Therapeutic activities;Manual Therapy;Moist Heat;Neuromuscular education    Plan , wrap hand in coban dip in paraffin, A/ROM, P/ROM strengthening    Consulted and Agree with Plan of Care Patient             Patient will benefit from skilled therapeutic intervention in order to improve the following deficits and impairments:   Body Structure / Function / Physical Skills: ADL, UE functional use, Endurance, Balance, Flexibility, Pain, FMC, ROM, Coordination, GMC, Decreased knowledge of precautions, Sensation, Scar mobility, Decreased knowledge of use of DME, IADL, Skin integrity, Dexterity, Strength, Mobility, Edema       Visit Diagnosis: Stiffness of right wrist, not elsewhere classified  Muscle weakness (generalized)  Other lack of coordination  Pain in right wrist    Problem List Patient Active Problem List   Diagnosis Date Noted   Fall 07/09/2021     Tashae Inda, OT 10/28/2021, 3:28 PM  [facility] [address] [place], [place], [address] Phone: [phone]   Fax:  [phone]  Name: Craig Church MRN: [id number] Date of Birth: Dec 22, 1965

## 2021-11-02 ENCOUNTER — Encounter: Payer: Self-pay | Admitting: Occupational Therapy

## 2021-11-02 ENCOUNTER — Other Ambulatory Visit: Payer: Self-pay

## 2021-11-02 ENCOUNTER — Ambulatory Visit: Payer: Worker's Compensation | Admitting: Occupational Therapy

## 2021-11-02 DIAGNOSIS — M25531 Pain in right wrist: Secondary | ICD-10-CM

## 2021-11-02 DIAGNOSIS — M25631 Stiffness of right wrist, not elsewhere classified: Secondary | ICD-10-CM | POA: Diagnosis not present

## 2021-11-02 DIAGNOSIS — R2689 Other abnormalities of gait and mobility: Secondary | ICD-10-CM

## 2021-11-02 DIAGNOSIS — M6281 Muscle weakness (generalized): Secondary | ICD-10-CM

## 2021-11-02 DIAGNOSIS — R278 Other lack of coordination: Secondary | ICD-10-CM

## 2021-11-02 NOTE — Therapy (Signed)
[facility] [address] [place], [place], [address] Phone: [phone]   Fax:  [phone]  Occupational Therapy Treatment  Patient Details  Name: Craig Lucksarl Berenguer MRN: [id number] Date of Birth: 16-Nov-1965 Referring Provider (OT): Altha HarmMathew Scheeler PA-C   Encounter Date: 11/02/2021   OT End of Session - 11/02/21 1410     Visit Number 19    Number of Visits 33    Date for OT Re-Evaluation 12/22/21    Authorization Type workers comp additional 8 visits approved following the inital 16 visits 16+8=24    Authorization - Visit Number 19    Authorization - Number of Visits 24    OT Start Time 1355    OT Stop Time 1440    OT Time Calculation (min) 45 min             Past Medical History:  Diagnosis Date   Hypertension    Seizure disorder (HCC)     Past Surgical History:  Procedure Laterality Date   CHOLECYSTECTOMY     OPEN REDUCTION INTERNAL FIXATION (ORIF) DISTAL PHALANX Right 07/12/2021   Procedure: OPEN REDUCTION INTERNAL FIXATION (ORIF) DISTAL RADIUS;  Surgeon: Allena NapoleonPace, Collier S, MD;  Location: [facility] OR;  Service: Plastics;  Laterality: Right;    There were no vitals filed for this visit.   Subjective Assessment - 11/02/21 1409     Subjective  Pt reports hand pain is better today    Pertinent History 56 yo male with onset of fall at work off a ladder was brought to hosp on 9/23 and received ORIF for R distal radial fracture on 9/26.  Mildly displaced R ulnar styloid fracture not requiring sx.  Received TLSO for spinal injuries of T12 comp fx, T11 anterior wedging, B inferior articular process and spinous process fractures.  PMHx:  HA, colloid cyst of brain, idiopathic intracranial hypertension, LE edema, lumbar spondylosis and radiculopathy, chestpain, HLD, seizure disorder.    Limitations cleared for ROM and strengthening per MD, back brace on,    Patient Stated Goals custom splint    Currently in Pain? Yes    Pain  Score 3     Pain Location Hand    Pain Orientation Right    Pain Descriptors / Indicators Aching    Pain Type Chronic pain    Pain Onset More than a month ago    Pain Frequency Intermittent    Aggravating Factors  over use    Pain Relieving Factors heat paraffin                 Treatment:Paraffin x 10 mins to R hand wrapped in coban and wrist for pain and stiffness, no adverse reactions. A/ROM IP and  MP flexion, composite place and hold for increased A/ROM Passive stretch in extension with 3lbs weight  each direction, A/ROM wrist flexion/ extension and ulnar/ radial deviation A/ROM wrist flexion/ extension and ulnar radial deviation and supination/ pronation with 1 lbs weight Digi- flex individually and compositely  for finger flexion yellow Functional grasp / release to place and remove graded clothespins for vertical antennae with RUE for sustained pich Pt was able to weight bear through RUE while retrieving a 3 lbs weight from floor. Standing at sink weightbearing through bilateral UE's for wrist extension while rocking forwards and backwards.                 OT Education - 11/02/21 1546     Education Details Pt was instructed to  perfrom self stretch for wrist extension with 3 lbs weight at home and to weightbear through bilateral UE's at sink for passive wrist extension    Person(s) Educated Patient    Methods Explanation;Demonstration;Verbal cues    Comprehension Verbalized understanding;Returned demonstration;Verbal cues required              OT Short Term Goals - 10/26/21 1434       OT SHORT TERM GOAL #1   Title I with splint wear, care and precautions    Time 6    Period Weeks    Status Achieved    Target Date 09/15/21      OT SHORT TERM GOAL #2   Title I with inital HEP    Time 6    Period Weeks    Status Achieved      OT SHORT TERM GOAL #3   Title Pt will demonstrate ability to perfrom at least 90% composite finger flexion in prep  for functional use.    Time 6    Period Weeks    Status Achieved   90%     OT SHORT TERM GOAL #4   Title Pt will demonstrate wrist flexion/ extension of at least 40* in prep for functional use.    Time 6    Period Weeks    Status Achieved   45*/ 40 extension     OT SHORT TERM GOAL #5   Title I with edema control.    Time 6    Period Weeks    Status Achieved      OT SHORT TERM GOAL #6   Title Pt will demonstrate 50* wrist flexion and 45* wrist extension in prep for functional use    Time 4    Period Weeks    Status New      OT SHORT TERM GOAL #7   Title Pt will demonstrate forearm supination/ pronation of 85* in prep for work activities    Time 4    Period Weeks    Status New      OT SHORT TERM GOAL #8   Title Pt will demonstrate grip strength of 23 lbs or greater in prep for functional use.    Time 4    Period Weeks    Status New      OT SHORT TERM GOAL  #9   TITLE Pt will report increased ease with the following tasks: cutting food, opening continers, and squeezing toothpaste.    Time 4    Period Weeks    Status New               OT Long Term Goals - 10/26/21 1420       OT LONG TERM GOAL #1   Title I with updated HEP.    Time 12    Period Weeks    Status On-going    Target Date 10/27/21      OT LONG TERM GOAL #2   Title Pt will demonstrate grip strength of at least 40 lbs in prep for work activities.    Time 8    Period Weeks    Status On-going   18.7     OT LONG TERM GOAL #3   Title Pt will demonstrate wrist flexion/ extension WFLS for work activities.    Time 8    Period Weeks    Status On-going      OT LONG TERM GOAL #4   Title Pt will demonstrate forearm supination/ pronation  of at least 85* in prep for work.    Time 12    Period Weeks    Status Revised   80/80- now a short term goal     OT LONG TERM GOAL #5   Title Pt will resume use of RUE at least 90% of the time for ADLS/ IADLS with pain no greater than 3/10.    Time 12    Period  Weeks    Status On-going   50% of the time     OT LONG TERM GOAL #6   Title Pt will perform simulated work activities modified independently    Time 12    Period Weeks    Status On-going   unable to perfrom due to pain and back prec.                  Plan - 11/02/21 1543     Clinical Impression Statement Pt is progressing towards goals. He continues to progress with improving ROM. Pt reports he cut back on putty use and his pain is better.    OT Occupational Profile and History Detailed Assessment- Review of Records and additional review of physical, cognitive, psychosocial history related to current functional performance    Occupational performance deficits (Please refer to evaluation for details): ADL's;IADL's;Work;Social Participation;Rest and Sleep;Leisure    Body Structure / Function / Physical Skills ADL;UE functional use;Endurance;Balance;Flexibility;Pain;FMC;ROM;Coordination;GMC;Decreased knowledge of precautions;Sensation;Scar mobility;Decreased knowledge of use of DME;IADL;Skin integrity;Dexterity;Strength;Mobility;Edema    Rehab Potential Good    OT Frequency 2x / week    OT Duration 8 weeks    OT Treatment/Interventions Self-care/ADL training;Ultrasound;Patient/family education;Scar mobilization;DME and/or AE instruction;Paraffin;Passive range of motion;Fluidtherapy;Cryotherapy;Functional Mobility Training;Splinting;Therapeutic activities;Manual Therapy;Moist Heat;Neuromuscular education    Plan wrap hand in coban dip in paraffin, A/ROM, P/ROM strengthening, functional use of hand    Consulted and Agree with Plan of Care Patient             Patient will benefit from skilled therapeutic intervention in order to improve the following deficits and impairments:   Body Structure / Function / Physical Skills: ADL, UE functional use, Endurance, Balance, Flexibility, Pain, FMC, ROM, Coordination, GMC, Decreased knowledge of precautions, Sensation, Scar mobility, Decreased  knowledge of use of DME, IADL, Skin integrity, Dexterity, Strength, Mobility, Edema       Visit Diagnosis: Stiffness of right wrist, not elsewhere classified  Muscle weakness (generalized)  Other lack of coordination  Pain in right wrist  Other abnormalities of gait and mobility    Problem List Patient Active Problem List   Diagnosis Date Noted   Fall 07/09/2021    Ashland Wiseman, OT 11/02/2021, 3:48 PM  [facility] [address] [place], [place], [address] Phone: [phone]   Fax:  [phone]  Name: Craig Church MRN: [id number] Date of Birth: 1966/05/10

## 2021-11-04 ENCOUNTER — Other Ambulatory Visit: Payer: Self-pay

## 2021-11-04 ENCOUNTER — Ambulatory Visit: Payer: Worker's Compensation | Admitting: Occupational Therapy

## 2021-11-04 DIAGNOSIS — M6281 Muscle weakness (generalized): Secondary | ICD-10-CM

## 2021-11-04 DIAGNOSIS — M25531 Pain in right wrist: Secondary | ICD-10-CM

## 2021-11-04 DIAGNOSIS — M25631 Stiffness of right wrist, not elsewhere classified: Secondary | ICD-10-CM

## 2021-11-04 DIAGNOSIS — R278 Other lack of coordination: Secondary | ICD-10-CM

## 2021-11-04 NOTE — Therapy (Signed)
[facility] [address] [place], [place], [address] Phone: [phone]   Fax:  [phone]  Occupational Therapy Treatment  Patient Details  Name: Syair Fricker MRN: [id number] Date of Birth: [birth year] Referring Provider (OT): Altha Harm PA-C   Encounter Date: 11/04/2021   OT End of Session - 11/04/21 1517     Visit Number 20    Number of Visits 33    Date for OT Re-Evaluation 12/22/21    Authorization Type workers comp additional 8 visits approved following the inital 16 visits 16+8=24    Authorization - Visit Number 20    Authorization - Number of Visits 24    OT Start Time 1447    OT Stop Time 1527    OT Time Calculation (min) 40 min             Past Medical History:  Diagnosis Date   Hypertension    Seizure disorder (HCC)     Past Surgical History:  Procedure Laterality Date   CHOLECYSTECTOMY     OPEN REDUCTION INTERNAL FIXATION (ORIF) DISTAL PHALANX Right 07/12/2021   Procedure: OPEN REDUCTION INTERNAL FIXATION (ORIF) DISTAL RADIUS;  Surgeon: Allena Napoleon, MD;  Location: [facility] OR;  Service: Plastics;  Laterality: Right;    There were no vitals filed for this visit.   Subjective Assessment - 11/04/21 1514     Pertinent History 56 yo male with onset of fall at work off a ladder was brought to hosp on 9/23 and received ORIF for R distal radial fracture on 9/26.  Mildly displaced R ulnar styloid fracture not requiring sx.  Received TLSO for spinal injuries of T12 comp fx, T11 anterior wedging, B inferior articular process and spinous process fractures.  PMHx:  HA, colloid cyst of brain, idiopathic intracranial hypertension, LE edema, lumbar spondylosis and radiculopathy, chestpain, HLD, seizure disorder.    Patient Stated Goals custom splint    Currently in Pain? Yes    Pain Location Hand    Pain Orientation Right    Pain Descriptors / Indicators Aching    Pain Type Chronic pain    Pain Onset More  than a month ago    Pain Frequency Intermittent    Aggravating Factors  overuse    Pain Relieving Factors heat, paraffin                            Treatment:Paraffin x 10 mins to R hand wrapped in coban and wrist for pain and stiffness, no adverse reactions. A/ROM   MP flexion, composite place and hold for increased A/ROM Passive stretch in extension with 3lbs weight  each direction,  A/ROM wrist flexion/ extension and ulnar radial deviation and supination/ pronation with 2 lbs weight Gripper set at level 2 sustained grip to pick up 1 inch blocks , min-mod difficulty     OT Education - 11/04/21 1518     Education Details upgraded wrist flexion/ extension and ulnar/ radial deviation, supination/ pronation to 2 lbs weight for HEP    Methods Explanation;Demonstration;Verbal cues    Comprehension Verbalized understanding;Returned demonstration;Verbal cues required              OT Short Term Goals - 10/26/21 1434       OT SHORT TERM GOAL #1   Title I with splint wear, care and precautions    Time 6    Period Weeks    Status Achieved  Target Date 09/15/21      OT SHORT TERM GOAL #2   Title I with inital HEP    Time 6    Period Weeks    Status Achieved      OT SHORT TERM GOAL #3   Title Pt will demonstrate ability to perfrom at least 90% composite finger flexion in prep for functional use.    Time 6    Period Weeks    Status Achieved   90%     OT SHORT TERM GOAL #4   Title Pt will demonstrate wrist flexion/ extension of at least 40* in prep for functional use.    Time 6    Period Weeks    Status Achieved   45*/ 40 extension     OT SHORT TERM GOAL #5   Title I with edema control.    Time 6    Period Weeks    Status Achieved      OT SHORT TERM GOAL #6   Title Pt will demonstrate 50* wrist flexion and 45* wrist extension in prep for functional use    Time 4    Period Weeks    Status New      OT SHORT TERM GOAL #7   Title Pt will  demonstrate forearm supination/ pronation of 85* in prep for work activities    Time 4    Period Weeks    Status New      OT SHORT TERM GOAL #8   Title Pt will demonstrate grip strength of 23 lbs or greater in prep for functional use.    Time 4    Period Weeks    Status New      OT SHORT TERM GOAL  #9   TITLE Pt will report increased ease with the following tasks: cutting food, opening continers, and squeezing toothpaste.    Time 4    Period Weeks    Status New               OT Long Term Goals - 10/26/21 1420       OT LONG TERM GOAL #1   Title I with updated HEP.    Time 12    Period Weeks    Status On-going    Target Date 10/27/21      OT LONG TERM GOAL #2   Title Pt will demonstrate grip strength of at least 40 lbs in prep for work activities.    Time 8    Period Weeks    Status On-going   18.7     OT LONG TERM GOAL #3   Title Pt will demonstrate wrist flexion/ extension WFLS for work activities.    Time 8    Period Weeks    Status On-going      OT LONG TERM GOAL #4   Title Pt will demonstrate forearm supination/ pronation of at least 85* in prep for work.    Time 12    Period Weeks    Status Revised   80/80- now a short term goal     OT LONG TERM GOAL #5   Title Pt will resume use of RUE at least 90% of the time for ADLS/ IADLS with pain no greater than 3/10.    Time 12    Period Weeks    Status On-going   50% of the time     OT LONG TERM GOAL #6   Title Pt will perform simulated work activities modified  independently    Time 12    Period Weeks    Status On-going   unable to perfrom due to pain and back prec.                   Patient will benefit from skilled therapeutic intervention in order to improve the following deficits and impairments:           Visit Diagnosis: Stiffness of right wrist, not elsewhere classified  Muscle weakness (generalized)  Other lack of coordination  Pain in right wrist    Problem  List Patient Active Problem List   Diagnosis Date Noted   Fall 07/09/2021    Xaden Kaufman, OT 11/04/2021, 3:22 PM  [facility] [address] [place], [place], [address] Phone: [phone]   Fax:  [phone]  Name: Jenel Lucksarl Colgan MRN: [id number] Date of Birth: October 19, 1965

## 2021-11-09 ENCOUNTER — Other Ambulatory Visit: Payer: Self-pay

## 2021-11-09 ENCOUNTER — Ambulatory Visit: Payer: Worker's Compensation | Attending: Family Medicine | Admitting: Occupational Therapy

## 2021-11-09 DIAGNOSIS — M6281 Muscle weakness (generalized): Secondary | ICD-10-CM | POA: Diagnosis present

## 2021-11-09 DIAGNOSIS — R600 Localized edema: Secondary | ICD-10-CM | POA: Insufficient documentation

## 2021-11-09 DIAGNOSIS — R2689 Other abnormalities of gait and mobility: Secondary | ICD-10-CM | POA: Insufficient documentation

## 2021-11-09 DIAGNOSIS — R278 Other lack of coordination: Secondary | ICD-10-CM | POA: Diagnosis present

## 2021-11-09 DIAGNOSIS — M25531 Pain in right wrist: Secondary | ICD-10-CM | POA: Diagnosis present

## 2021-11-09 DIAGNOSIS — M25631 Stiffness of right wrist, not elsewhere classified: Secondary | ICD-10-CM | POA: Diagnosis present

## 2021-11-09 NOTE — Therapy (Addendum)
[facility] [address] [place], [place], [address] Phone: [phone]   Fax:  [phone]  Occupational Therapy Treatment  Patient Details  Name: Craig Church MRN: [id number] Date of Birth: Dec 10, 1965 Referring Provider (OT): Altha HarmMathew Scheeler PA-C   Encounter Date: 11/09/2021   OT End of Session - 11/09/21 1438     Visit Number 21    Number of Visits 33    Date for OT Re-Evaluation 12/22/21    Authorization Type workers comp additional 8 visits approved following the inital 16 visits 16+8=24    Authorization - Visit Number 21    Authorization - Number of Visits 24    OT Start Time 1403    OT Stop Time 1445    OT Time Calculation (min) 42 min             Past Medical History:  Diagnosis Date   Hypertension    Seizure disorder (HCC)     Past Surgical History:  Procedure Laterality Date   CHOLECYSTECTOMY     OPEN REDUCTION INTERNAL FIXATION (ORIF) DISTAL PHALANX Right 07/12/2021   Procedure: OPEN REDUCTION INTERNAL FIXATION (ORIF) DISTAL RADIUS;  Surgeon: Allena NapoleonPace, Collier S, MD;  Location: [facility] OR;  Service: Plastics;  Laterality: Right;    There were no vitals filed for this visit.                  Treatment:Paraffin x 10 mins to R hand wrapped in coban and wrist for pain and stiffness, no adverse reactions. A/ROM and P/ROM composite place and hold for increased A/ROM, P/ROM composite flexion for individual digits Passive stretch in extension for prayer strtch then with 3lbs weight  for passive wrist stretch  A/ROM wrist flexion/ extension and ulnar/ radial deviation with 2 lbs weight Grooved pegs for fine motor coordination/ in hand manipulation, then removing with in hand manipulation, mod difficulty/ v.c, pt was easily frustrated Pt was issued cardboard rolls for use with theraband at home(PT exercises)  Therapist encouraged weaning from compression glove if possible since he is 3-4 mons post  injury and he may use RUE more if he is not wearing glove..          OT Short Term Goals - 10/26/21 1434       OT SHORT TERM GOAL #1   Title I with splint wear, care and precautions    Time 6    Period Weeks    Status Achieved    Target Date 09/15/21      OT SHORT TERM GOAL #2   Title I with inital HEP    Time 6    Period Weeks    Status Achieved      OT SHORT TERM GOAL #3   Title Pt will demonstrate ability to perfrom at least 90% composite finger flexion in prep for functional use.    Time 6    Period Weeks    Status Achieved   90%     OT SHORT TERM GOAL #4   Title Pt will demonstrate wrist flexion/ extension of at least 40* in prep for functional use.    Time 6    Period Weeks    Status Achieved   45*/ 40 extension     OT SHORT TERM GOAL #5   Title I with edema control.    Time 6    Period Weeks    Status Achieved      OT SHORT TERM GOAL #6  Title Pt will demonstrate 50* wrist flexion and 45* wrist extension in prep for functional use    Time 4    Period Weeks    Status New      OT SHORT TERM GOAL #7   Title Pt will demonstrate forearm supination/ pronation of 85* in prep for work activities    Time 4    Period Weeks    Status New      OT SHORT TERM GOAL #8   Title Pt will demonstrate grip strength of 23 lbs or greater in prep for functional use.    Time 4    Period Weeks    Status New      OT SHORT TERM GOAL  #9   TITLE Pt will report increased ease with the following tasks: cutting food, opening continers, and squeezing toothpaste.    Time 4    Period Weeks    Status New               OT Long Term Goals - 10/26/21 1420       OT LONG TERM GOAL #1   Title I with updated HEP.    Time 12    Period Weeks    Status On-going    Target Date 10/27/21      OT LONG TERM GOAL #2   Title Pt will demonstrate grip strength of at least 40 lbs in prep for work activities.    Time 8    Period Weeks    Status On-going   18.7     OT LONG  TERM GOAL #3   Title Pt will demonstrate wrist flexion/ extension WFLS for work activities.    Time 8    Period Weeks    Status On-going      OT LONG TERM GOAL #4   Title Pt will demonstrate forearm supination/ pronation of at least 85* in prep for work.    Time 12    Period Weeks    Status Revised   80/80- now a short term goal     OT LONG TERM GOAL #5   Title Pt will resume use of RUE at least 90% of the time for ADLS/ IADLS with pain no greater than 3/10.    Time 12    Period Weeks    Status On-going   50% of the time     OT LONG TERM GOAL #6   Title Pt will perform simulated work activities modified independently    Time 12    Period Weeks    Status On-going   unable to perfrom due to pain and back prec.                  Plan - 11/10/21 0726     Clinical Impression Statement Pt is progressing towards goals. He continues to progress with improving ROM and strength. He has beeen encouraged to stop wearing edema glove and to use RUE for light activities.    OT Occupational Profile and History Detailed Assessment- Review of Records and additional review of physical, cognitive, psychosocial history related to current functional performance    Occupational performance deficits (Please refer to evaluation for details): ADL's;IADL's;Work;Social Participation;Rest and Sleep;Leisure    Rehab Potential Good    OT Frequency 2x / week    OT Duration 8 weeks    OT Treatment/Interventions Self-care/ADL training;Ultrasound;Patient/family education;Scar mobilization;DME and/or AE instruction;Paraffin;Passive range of motion;Fluidtherapy;Cryotherapy;Functional Mobility Training;Splinting;Therapeutic activities;Manual Therapy;Moist Heat;Neuromuscular education    Plan  wrap hand in coban dip in paraffin, A/ROM, P/ROM strengthening, functional use of hand, putty if pt brings in    Consulted and Agree with Plan of Care Patient             Patient will benefit from skilled  therapeutic intervention in order to improve the following deficits and impairments:           Visit Diagnosis: Stiffness of right wrist, not elsewhere classified  Muscle weakness (generalized)  Other lack of coordination  Pain in right wrist  Other abnormalities of gait and mobility  Localized edema    Problem List Patient Active Problem List   Diagnosis Date Noted   Fall 07/09/2021    Talen Poser, OT 11/10/2021, 7:28 AM  [facility] [address] [place], [place], [address] Phone: [phone]   Fax:  [phone]  Name: Jaeshawn Silvio MRN: [id number] Date of Birth: 1966/01/11

## 2021-11-11 ENCOUNTER — Ambulatory Visit: Payer: Worker's Compensation | Admitting: Occupational Therapy

## 2021-11-11 ENCOUNTER — Encounter: Payer: Self-pay | Admitting: Occupational Therapy

## 2021-11-11 ENCOUNTER — Other Ambulatory Visit: Payer: Self-pay

## 2021-11-11 DIAGNOSIS — M25631 Stiffness of right wrist, not elsewhere classified: Secondary | ICD-10-CM

## 2021-11-11 DIAGNOSIS — R278 Other lack of coordination: Secondary | ICD-10-CM

## 2021-11-11 DIAGNOSIS — M25531 Pain in right wrist: Secondary | ICD-10-CM

## 2021-11-11 DIAGNOSIS — M6281 Muscle weakness (generalized): Secondary | ICD-10-CM

## 2021-11-11 DIAGNOSIS — R600 Localized edema: Secondary | ICD-10-CM

## 2021-11-11 NOTE — Therapy (Signed)
[facility] [address] [place], [place], [address] Phone: [phone]   Fax:  [phone]  Occupational Therapy Treatment  Patient Details  Name: Craig Church MRN: [id number] Date of Birth: 12-10-1965 Referring Provider (OT): Altha Harm PA-C   Encounter Date: 11/11/2021   OT End of Session - 11/11/21 1601     Visit Number 22    Number of Visits 33    Date for OT Re-Evaluation 12/22/21    Authorization Type workers comp additional 8 visits approved following the inital 16 visits 16+8=24    Authorization - Visit Number 22    Authorization - Number of Visits 24    OT Start Time 1450    OT Stop Time 1535    OT Time Calculation (min) 45 min    Activity Tolerance Patient tolerated treatment well    Behavior During Therapy WFL for tasks assessed/performed             Past Medical History:  Diagnosis Date   Hypertension    Seizure disorder (HCC)     Past Surgical History:  Procedure Laterality Date   CHOLECYSTECTOMY     OPEN REDUCTION INTERNAL FIXATION (ORIF) DISTAL PHALANX Right 07/12/2021   Procedure: OPEN REDUCTION INTERNAL FIXATION (ORIF) DISTAL RADIUS;  Surgeon: Allena Napoleon, MD;  Location: [facility] OR;  Service: Plastics;  Laterality: Right;    There were no vitals filed for this visit.   Subjective Assessment - 11/11/21 1453     Subjective  It stays stiff    Pertinent History 56 yo male with onset of fall at work off a ladder was brought to hosp on 9/23 and received ORIF for R distal radial fracture on 9/26.  Mildly displaced R ulnar styloid fracture not requiring sx.  Received TLSO for spinal injuries of T12 comp fx, T11 anterior wedging, B inferior articular process and spinous process fractures.  PMHx:  HA, colloid cyst of brain, idiopathic intracranial hypertension, LE edema, lumbar spondylosis and radiculopathy, chestpain, HLD, seizure disorder.    Limitations cleared for ROM and strengthening per  MD, back brace on,    Currently in Pain? Yes    Pain Score 2     Pain Location Hand    Pain Orientation Right    Pain Descriptors / Indicators Throbbing    Pain Type Chronic pain    Pain Onset More than a month ago    Pain Frequency Intermittent    Aggravating Factors  use, squeezing    Pain Relieving Factors Heat, paraffin                          OT Treatments/Exercises (OP) - 11/11/21 0001       ADLs   Work This session focused on increasing functional use of right hand.  Talked at length about starting to use hand tools for light tasks at home, e.g. changing out the wall air filters, using spray bottle and cleaning mirrors, windows, use of scissors, cutting with knife.  Patient reports inability to do several of these tasks, but when actually had the opportunity did better than he expected.  Discussed importance of finding ways to work within limitations of hand to offer more movement opportunities throughout the day.  This can build strength and range of motion.  Patient reported being able to open medicine bottle for the first time this morning!    Worked on turning screws, cutting with knife, and use of  scissors.      RUE Paraffin   Number Minutes Paraffin 10 Minutes    RUE Paraffin Location Hand   wrist, distal forearm   Comments Wrapped in composite flexion - prior to exercise.  Reviewed functional tasks that patient is involved in at home, and tasks that are challenging.                      OT Short Term Goals - 11/11/21 1605       OT SHORT TERM GOAL #1   Title I with splint wear, care and precautions    Time 6    Period Weeks    Status Achieved    Target Date 09/15/21      OT SHORT TERM GOAL #2   Title I with inital HEP    Time 6    Period Weeks    Status Achieved      OT SHORT TERM GOAL #3   Title Pt will demonstrate ability to perfrom at least 90% composite finger flexion in prep for functional use.    Time 6    Period Weeks     Status Achieved   90%     OT SHORT TERM GOAL #4   Title Pt will demonstrate wrist flexion/ extension of at least 40* in prep for functional use.    Time 6    Period Weeks    Status Achieved   45*/ 40 extension     OT SHORT TERM GOAL #5   Title I with edema control.    Time 6    Period Weeks    Status Achieved      OT SHORT TERM GOAL #6   Title Pt will demonstrate 50* wrist flexion and 45* wrist extension in prep for functional use    Time 4    Period Weeks    Status On-going      OT SHORT TERM GOAL #7   Title Pt will demonstrate forearm supination/ pronation of 85* in prep for work activities    Time 4    Period Weeks    Status On-going      OT SHORT TERM GOAL #8   Title Pt will demonstrate grip strength of 23 lbs or greater in prep for functional use.    Time 4    Period Weeks    Status On-going      OT SHORT TERM GOAL  #9   TITLE Pt will report increased ease with the following tasks: cutting food, opening continers, and squeezing toothpaste.    Time 4    Period Weeks    Status On-going               OT Long Term Goals - 11/11/21 1606       OT LONG TERM GOAL #1   Title I with updated HEP.    Time 12    Period Weeks    Status On-going    Target Date 10/27/21      OT LONG TERM GOAL #2   Title Pt will demonstrate grip strength of at least 40 lbs in prep for work activities.    Time 8    Period Weeks    Status On-going   18.7     OT LONG TERM GOAL #3   Title Pt will demonstrate wrist flexion/ extension WFLS for work activities.    Time 8    Period Weeks    Status On-going  OT LONG TERM GOAL #4   Title Pt will demonstrate forearm supination/ pronation of at least 85* in prep for work.    Time 12    Period Weeks    Status Revised   80/80- now a short term goal     OT LONG TERM GOAL #5   Title Pt will resume use of RUE at least 90% of the time for ADLS/ IADLS with pain no greater than 3/10.    Time 12    Period Weeks    Status On-going   50%  of the time     OT LONG TERM GOAL #6   Title Pt will perform simulated work activities modified independently    Time 12    Period Weeks    Status On-going   unable to perfrom due to pain and back prec.                  Plan - 11/11/21 1604     Clinical Impression Statement Pt is still experiencing stiffness and pain in right hand, yet is showing improvement in his attemots to use hand functionally.  Patient continues to benefit from OP OT.    OT Occupational Profile and History Detailed Assessment- Review of Records and additional review of physical, cognitive, psychosocial history related to current functional performance    Occupational performance deficits (Please refer to evaluation for details): ADL's;IADL's;Work;Social Participation;Rest and Sleep;Leisure    Rehab Potential Good    Clinical Decision Making Limited treatment options, no task modification necessary    Comorbidities Affecting Occupational Performance: May have comorbidities impacting occupational performance    Modification or Assistance to Complete Evaluation  Min-Moderate modification of tasks or assist with assess necessary to complete eval    OT Frequency 2x / week    OT Duration 8 weeks    OT Treatment/Interventions Self-care/ADL training;Ultrasound;Patient/family education;Scar mobilization;DME and/or AE instruction;Paraffin;Passive range of motion;Fluidtherapy;Cryotherapy;Functional Mobility Training;Splinting;Therapeutic activities;Manual Therapy;Moist Heat;Neuromuscular education    Plan wrap hand in coban dip in paraffin, A/ROM, P/ROM strengthening, functional use of hand, putty if pt brings in    Consulted and Agree with Plan of Care Patient             Patient will benefit from skilled therapeutic intervention in order to improve the following deficits and impairments:           Visit Diagnosis: Stiffness of right wrist, not elsewhere classified  Muscle weakness (generalized)  Other  lack of coordination  Pain in right wrist  Localized edema    Problem List Patient Active Problem List   Diagnosis Date Noted   Fall 07/09/2021    Collier Salina, OT 11/11/2021, 4:07 PM  [facility] [address] [place], [place], [address] Phone: [phone]   Fax:  [phone]  Name: Ferrel Simington MRN: [id number] Date of Birth: Jun 10, 1966

## 2021-11-16 ENCOUNTER — Ambulatory Visit: Payer: Worker's Compensation | Admitting: Occupational Therapy

## 2021-11-16 ENCOUNTER — Other Ambulatory Visit: Payer: Self-pay

## 2021-11-16 DIAGNOSIS — M6281 Muscle weakness (generalized): Secondary | ICD-10-CM

## 2021-11-16 DIAGNOSIS — M25631 Stiffness of right wrist, not elsewhere classified: Secondary | ICD-10-CM

## 2021-11-16 DIAGNOSIS — R278 Other lack of coordination: Secondary | ICD-10-CM

## 2021-11-16 DIAGNOSIS — M25531 Pain in right wrist: Secondary | ICD-10-CM

## 2021-11-16 DIAGNOSIS — R600 Localized edema: Secondary | ICD-10-CM

## 2021-11-16 NOTE — Patient Instructions (Signed)
Weight Bearing (Standing)   Rest palms comfortably on table, gently lean to the side and forward over hand. Hold _10___ seconds. Repeat __10__ times. Do __1-2__ sessions per day.

## 2021-11-16 NOTE — Therapy (Signed)
[facility] [address] [place], [place], [address] Phone: [phone]   Fax:  [phone]  Occupational Therapy Treatment  Patient Details  Name: Craig Church MRN: [id number] Date of Birth: [birth year] Referring Provider (OT): Altha Harm PA-C   Encounter Date: 11/16/2021   OT End of Session - 11/16/21 1619     Visit Number 23    Number of Visits 33    Date for OT Re-Evaluation 12/22/21    Authorization Type workers comp additional 8 visits approved following the inital 16 visits 16+8=24, 8 additional visits approved 11/11/21    Authorization - Visit Number 23    Authorization - Number of Visits 32   8 additional visits approved 11/11/21   OT Start Time 1405    OT Stop Time 1445    OT Time Calculation (min) 40 min    Activity Tolerance Patient tolerated treatment well    Behavior During Therapy [facility] for tasks assessed/performed             Past Medical History:  Diagnosis Date   Hypertension    Seizure disorder (HCC)     Past Surgical History:  Procedure Laterality Date   CHOLECYSTECTOMY     OPEN REDUCTION INTERNAL FIXATION (ORIF) DISTAL PHALANX Right 07/12/2021   Procedure: OPEN REDUCTION INTERNAL FIXATION (ORIF) DISTAL RADIUS;  Surgeon: Allena Napoleon, MD;  Location: [facility] OR;  Service: Plastics;  Laterality: Right;    There were no vitals filed for this visit.   Subjective Assessment - 11/16/21 1634     Subjective  Pt reports he sees MD on Thurs.    Pertinent History 56 yo male with onset of fall at work off a ladder was brought to hosp on 9/23 and received ORIF for R distal radial fracture on 9/26.  Mildly displaced R ulnar styloid fracture not requiring sx.  Received TLSO for spinal injuries of T12 comp fx, T11 anterior wedging, B inferior articular process and spinous process fractures.  PMHx:  HA, colloid cyst of brain, idiopathic intracranial hypertension, LE edema, lumbar spondylosis and  radiculopathy, chestpain, HLD, seizure disorder.    Limitations cleared for ROM and strengthening per MD, back brace on,    Patient Stated Goals custom splint    Currently in Pain? Yes    Pain Score 3     Pain Location Hand    Pain Orientation Right    Pain Descriptors / Indicators Aching    Pain Type Chronic pain    Pain Onset More than a month ago    Pain Frequency Intermittent    Aggravating Factors  use    Pain Relieving Factors heat, paraffin                    A/ROM :Wrist flexion/ extension : 55/ 50 Supination/ pronation:85/80 Grip strength: 30.7 lbs   Treatment:Paraffin x 10 mins to R hand wrapped in coban and wrist for pain and stiffness, no adverse reactions. A/ROM and P/ROM composite place and hold for increased A/ROM, P/ROM composite flexion  and A/ROM tendon gliding exercises Passive stretch in extension for prayer stretch then with 3lbs weight  for passive wrist stretch  A/ROM wrist flexion/ extension and ulnar/ radial deviation with 2 lbs weight Therapist encouraged weaning from compression glove if possible since he is 3-4 mons post injury and he may use RUE more if he is not wearing glove. Therapist recommends pt wears edema glove only at night.  OT Treatment/Education - 11/16/21 1428     Education Details upgraded putty exercise to red, weightbearing through tabletop for wrist extension    Person(s) Educated Patient    Methods Explanation;Demonstration;Verbal cues;Handout    Comprehension Verbalized understanding;Returned demonstration;Verbal cues required              OT Short Term Goals - 11/16/21 1427       OT SHORT TERM GOAL #1   Title I with splint wear, care and precautions    Time 6    Period Weeks    Status Achieved    Target Date 09/15/21      OT SHORT TERM GOAL #2   Title I with inital HEP    Time 6    Period Weeks    Status Achieved      OT SHORT TERM GOAL #3   Title Pt will demonstrate ability to  perfrom at least 90% composite finger flexion in prep for functional use.    Time 6    Period Weeks    Status Achieved   90%     OT SHORT TERM GOAL #4   Title Pt will demonstrate wrist flexion/ extension of at least 40* in prep for functional use.    Time 6    Period Weeks    Status Achieved   45*/ 40 extension     OT SHORT TERM GOAL #5   Title I with edema control.    Time 6    Period Weeks    Status Achieved      OT SHORT TERM GOAL #6   Title Pt will demonstrate 50* wrist flexion and 45* wrist extension in prep for functional use    Time 4    Period Weeks    Status On-going      OT SHORT TERM GOAL #7   Title Pt will demonstrate forearm supination/ pronation of 85* in prep for work activities    Time 4    Period Weeks    Status On-going      OT SHORT TERM GOAL #8   Title Pt will demonstrate grip strength of 23 lbs or greater in prep for functional use.    Time 4    Period Weeks    Status Achieved   11/16/21-30.7 lbs     OT SHORT TERM GOAL  #9   TITLE Pt will report increased ease with the following tasks: cutting food, opening containers, and squeezing toothpaste.    Time 4    Period Weeks    Status On-going   Pt reports increased ease with opening continers and squeezing toothpaste              OT Long Term Goals - 11/16/21 1426       OT LONG TERM GOAL #1   Title I with updated HEP.    Time 12    Period Weeks    Status On-going    Target Date 10/27/21      OT LONG TERM GOAL #2   Title Pt will demonstrate grip strength of at least 40 lbs in prep for work activities.    Time 8    Period Weeks    Status On-going   30.7 lbs-11/16/21     OT LONG TERM GOAL #3   Title Pt will demonstrate wrist flexion/ extension WFLS for work activities.    Time 8    Period Weeks    Status On-going      OT  LONG TERM GOAL #4   Title Pt will demonstrate forearm supination/ pronation of at least 85* in prep for work.    Time 12    Period Weeks    Status Revised   80/80-  now a short term goal     OT LONG TERM GOAL #5   Title Pt will resume use of RUE at least 90% of the time for ADLS/ IADLS with pain no greater than 3/10.    Time 12    Period Weeks    Status On-going   50% of the time     OT LONG TERM GOAL #6   Title Pt will perform simulated work activities modified independently    Time 12    Period Weeks    Status On-going   unable to perfrom due to pain and back prec.                  Plan - 11/16/21 1622     Clinical Impression Statement Pt is progressing towards goals slowly as he is  experiencing stiffness and pain in right hand.    OT Occupational Profile and History Detailed Assessment- Review of Records and additional review of physical, cognitive, psychosocial history related to current functional performance    Occupational performance deficits (Please refer to evaluation for details): ADL's;IADL's;Work;Social Participation;Rest and Sleep;Leisure    Body Structure / Function / Physical Skills ADL;UE functional use;Endurance;Balance;Flexibility;Pain;FMC;ROM;Coordination;GMC;Decreased knowledge of precautions;Sensation;Scar mobility;Decreased knowledge of use of DME;IADL;Skin integrity;Dexterity;Strength;Mobility;Edema    Rehab Potential Good    Clinical Decision Making Limited treatment options, no task modification necessary    Comorbidities Affecting Occupational Performance: May have comorbidities impacting occupational performance    Modification or Assistance to Complete Evaluation  Min-Moderate modification of tasks or assist with assess necessary to complete eval    OT Frequency 2x / week    OT Duration 8 weeks    OT Treatment/Interventions Self-care/ADL training;Ultrasound;Patient/family education;Scar mobilization;DME and/or AE instruction;Paraffin;Passive range of motion;Fluidtherapy;Cryotherapy;Functional Mobility Training;Splinting;Therapeutic activities;Manual Therapy;Moist Heat;Neuromuscular education    Plan wrap hand  in coban dip in paraffin, A/ROM, P/ROM strengthening, continue light strengthening    Consulted and Agree with Plan of Care Patient             Patient will benefit from skilled therapeutic intervention in order to improve the following deficits and impairments:   Body Structure / Function / Physical Skills: ADL, UE functional use, Endurance, Balance, Flexibility, Pain, FMC, ROM, Coordination, GMC, Decreased knowledge of precautions, Sensation, Scar mobility, Decreased knowledge of use of DME, IADL, Skin integrity, Dexterity, Strength, Mobility, Edema       Visit Diagnosis: Stiffness of right wrist, not elsewhere classified  Muscle weakness (generalized)  Other lack of coordination  Pain in right wrist  Localized edema    Problem List Patient Active Problem List   Diagnosis Date Noted   Fall 07/09/2021    Kenyata Napier, OT 11/16/2021, 4:37 PM  [facility] [address] [place], [place], [address] Phone: [phone]   Fax:  [phone]  Name: Craig Church MRN: [id number] Date of Birth: 1966-09-01

## 2021-11-17 ENCOUNTER — Other Ambulatory Visit: Payer: Self-pay | Admitting: Plastic Surgery

## 2021-11-17 DIAGNOSIS — S52501A Unspecified fracture of the lower end of right radius, initial encounter for closed fracture: Secondary | ICD-10-CM

## 2021-11-18 ENCOUNTER — Encounter: Payer: Self-pay | Admitting: Occupational Therapy

## 2021-11-18 ENCOUNTER — Ambulatory Visit: Payer: Worker's Compensation | Attending: Family Medicine | Admitting: Occupational Therapy

## 2021-11-18 ENCOUNTER — Ambulatory Visit (INDEPENDENT_AMBULATORY_CARE_PROVIDER_SITE_OTHER): Payer: Worker's Compensation | Admitting: Plastic Surgery

## 2021-11-18 ENCOUNTER — Other Ambulatory Visit: Payer: Self-pay

## 2021-11-18 DIAGNOSIS — M25631 Stiffness of right wrist, not elsewhere classified: Secondary | ICD-10-CM | POA: Diagnosis present

## 2021-11-18 DIAGNOSIS — R278 Other lack of coordination: Secondary | ICD-10-CM | POA: Insufficient documentation

## 2021-11-18 DIAGNOSIS — M6281 Muscle weakness (generalized): Secondary | ICD-10-CM | POA: Insufficient documentation

## 2021-11-18 DIAGNOSIS — M25531 Pain in right wrist: Secondary | ICD-10-CM | POA: Diagnosis present

## 2021-11-18 DIAGNOSIS — R600 Localized edema: Secondary | ICD-10-CM | POA: Insufficient documentation

## 2021-11-18 DIAGNOSIS — S52501A Unspecified fracture of the lower end of right radius, initial encounter for closed fracture: Secondary | ICD-10-CM | POA: Diagnosis not present

## 2021-11-18 NOTE — Progress Notes (Signed)
° °  Referring Provider Jettie Booze, NP [place] [address],  [place] [address]   CC:  Chief Complaint  Patient presents with   Follow-up      Craig Church is an 56 y.o. male.  HPI: Patient presents about 4 months out from open reduction internal fixation of right distal radius fracture.  This is a heavily comminuted fracture with intra-articular involvement.  He has been bothered by persistent stiffness primarily at the MP joints.  Otherwise he feels like he is making progress in therapy.  Review of Systems General: Denies fevers and chills  Physical Exam Vitals with BMI 07/20/2021 07/19/2021 07/19/2021  Height - - -  Weight - - -  BMI - - -  Systolic [id number]  Diastolic 74 74 67  Pulse 59 66 89    General:  No acute distress,  Alert and oriented, Non-Toxic, Normal speech and affect Right hand: Fingers well perfused normal cap refill popliteal pulse.  Sensation is intact throughout.  Intrinsic musculature is intact.  He can flex all fingers but MP joint passive and active motion is limited to about 60 degrees all the way across.  IP joint motion looks to be pretty good.  He is improving his wrist range of motion with at least 20 degrees of flexion and extension if not more.  Pronation and supination seem intact.  Scar is well-healed.  Assessment/Plan Patient is making slow progress after open reduction internal fixation.  Previous x-rays of showed good alignment of the hardware and bony fragments with appropriate healing.  I will plan to get another x-ray of the hand and wrist today.  He is content to continue with therapy and feels like it is making a difference and I think that is a great option for him.  We will plan to see him again in 2 months.  All his questions were answered.  Cindra Presume 11/18/2021, 2:15 PM

## 2021-11-18 NOTE — Therapy (Signed)
[facility] [address] [place], [place], [address] Phone: [phone]   Fax:  [phone]  Occupational Therapy Treatment  Patient Details  Name: Craig Church MRN: [id number] Date of Birth: 1966-09-22 Referring Provider (OT): Altha Harm PA-C   Encounter Date: 11/18/2021   OT End of Session - 11/18/21 1628     Visit Number 24    Number of Visits 33    Date for OT Re-Evaluation 12/22/21    Authorization Type workers comp additional 8 visits approved following the inital 16 visits 16+8=24, 8 additional visits approved 11/11/21    Authorization - Visit Number 24    Authorization - Number of Visits 32    OT Start Time 1530    OT Stop Time 1615    OT Time Calculation (min) 45 min    Equipment Utilized During Treatment Gripper    Activity Tolerance Patient tolerated treatment well    Behavior During Therapy [facility] for tasks assessed/performed             Past Medical History:  Diagnosis Date   Hypertension    Seizure disorder (HCC)     Past Surgical History:  Procedure Laterality Date   CHOLECYSTECTOMY     OPEN REDUCTION INTERNAL FIXATION (ORIF) DISTAL PHALANX Right 07/12/2021   Procedure: OPEN REDUCTION INTERNAL FIXATION (ORIF) DISTAL RADIUS;  Surgeon: Allena Napoleon, MD;  Location: [facility] OR;  Service: Plastics;  Laterality: Right;    There were no vitals filed for this visit.   Subjective Assessment - 11/18/21 1549     Subjective  Patient reports MD said therapy is helping    Pertinent History 56 yo male with onset of fall at work off a ladder was brought to hosp on 9/23 and received ORIF for R distal radial fracture on 9/26.  Mildly displaced R ulnar styloid fracture not requiring sx.  Received TLSO for spinal injuries of T12 comp fx, T11 anterior wedging, B inferior articular process and spinous process fractures.  PMHx:  HA, colloid cyst of brain, idiopathic intracranial hypertension, LE edema, lumbar  spondylosis and radiculopathy, chestpain, HLD, seizure disorder.    Limitations cleared for ROM and strengthening per MD, back brace on,    Currently in Pain? 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      Exercises   Exercises Wrist;Hand      Hand Exercises   Other Hand Exercises Passive and active range of motion right hand with emphasis on MCP/PIP joints for flexion and extension following paraffin.  Patient with overall less pain    Other Hand Exercises Worked on light strengthening following active range of motion intrinsic plus to hook fist - then with resistance.  Also gripper on second setting x 10 blocks x 2.  Then on  3rd setting x 3.      Manual Therapy   Manual Therapy Joint mobilization    Joint Mobilization Digits at MCP/PIP joints to improve range of motion                    OT Education - 11/18/21 1627     Education Details patient instructed to call his workers comp Sports coach to discuss plan to have lower back assessed.  Patient with pain in low back, burning in right thigh, and cold numbness down to foot of right leg since injury.    Person(s) Educated Patient    Methods Explanation    Comprehension Verbalized understanding              OT Short Term Goals - 11/18/21 1631       OT SHORT TERM GOAL #1   Title I with splint wear, care and precautions    Time 6    Period Weeks    Status Achieved    Target Date 09/15/21      OT SHORT TERM GOAL #2   Title I with inital HEP     Time 6    Period Weeks    Status Achieved      OT SHORT TERM GOAL #3   Title Pt will demonstrate ability to perfrom at least 90% composite finger flexion in prep for functional use.    Time 6    Period Weeks    Status Achieved   90%     OT SHORT TERM GOAL #4   Title Pt will demonstrate wrist flexion/ extension of at least 40* in prep for functional use.    Time 6    Period Weeks    Status Achieved   45*/ 40 extension     OT SHORT TERM GOAL #5   Title I with edema control.    Time 6    Period Weeks    Status Achieved      OT SHORT TERM GOAL #6   Title Pt will demonstrate 50* wrist flexion and 45* wrist extension in prep for functional use    Time 4    Period Weeks    Status On-going      OT SHORT TERM GOAL #7   Title Pt will demonstrate forearm supination/ pronation of 85* in prep for work activities    Time 4    Period Weeks    Status On-going      OT SHORT TERM GOAL #8   Title Pt will demonstrate grip strength of 23 lbs or greater in prep for functional use.    Time 4    Period Weeks    Status Achieved   11/16/21-30.7 lbs     OT SHORT TERM GOAL  #9   TITLE Pt will report increased ease with the following tasks: cutting food, opening containers, and squeezing toothpaste.    Time 4    Period Weeks    Status On-going   Pt reports increased ease with opening continers and squeezing toothpaste              OT Long Term Goals - 11/18/21 1632       OT LONG TERM GOAL #1   Title I with updated HEP.    Time 12    Period Weeks    Status On-going    Target Date 10/27/21      OT LONG TERM GOAL #2   Title Pt will demonstrate  grip strength of at least 40 lbs in prep for work activities.    Time 8    Period Weeks    Status On-going   30.7 lbs-11/16/21     OT LONG TERM GOAL #3   Title Pt will demonstrate wrist flexion/ extension WFLS for work activities.    Time 8    Period Weeks    Status On-going      OT LONG TERM GOAL #4   Title Pt will demonstrate forearm  supination/ pronation of at least 85* in prep for work.    Time 12    Period Weeks    Status Revised   80/80- now a short term goal     OT LONG TERM GOAL #5   Title Pt will resume use of RUE at least 90% of the time for ADLS/ IADLS with pain no greater than 3/10.    Time 12    Period Weeks    Status On-going   50% of the time     OT LONG TERM GOAL #6   Title Pt will perform simulated work activities modified independently    Time 12    Period Weeks    Status On-going   unable to perfrom due to pain and back prec.                  Plan - 11/18/21 1629     Clinical Impression Statement Pt is showing steady progress yet is limited by stiffness in right hand.  Patient saw Dr Arita MissPace today, and he acknowledged overall improvement in range o fmotion but has concerns for hand stiffness - is ordering additional XRays for right wrist and hand.    OT Occupational Profile and History Detailed Assessment- Review of Records and additional review of physical, cognitive, psychosocial history related to current functional performance    Occupational performance deficits (Please refer to evaluation for details): ADL's;IADL's;Work;Social Participation;Rest and Sleep;Leisure    Body Structure / Function / Physical Skills ADL;UE functional use;Endurance;Balance;Flexibility;Pain;FMC;ROM;Coordination;GMC;Decreased knowledge of precautions;Sensation;Scar mobility;Decreased knowledge of use of DME;IADL;Skin integrity;Dexterity;Strength;Mobility;Edema    Rehab Potential Good    Clinical Decision Making Limited treatment options, no task modification necessary    Comorbidities Affecting Occupational Performance: May have comorbidities impacting occupational performance    Modification or Assistance to Complete Evaluation  Min-Moderate modification of tasks or assist with assess necessary to complete eval    OT Frequency 2x / week    OT Duration 8 weeks    OT Treatment/Interventions Self-care/ADL  training;Ultrasound;Patient/family education;Scar mobilization;DME and/or AE instruction;Paraffin;Passive range of motion;Fluidtherapy;Cryotherapy;Functional Mobility Training;Splinting;Therapeutic activities;Manual Therapy;Moist Heat;Neuromuscular education    Plan wrap hand in coban dip in paraffin, A/ROM, P/ROM strengthening, continue light strengthening    Consulted and Agree with Plan of Care Patient             Patient will benefit from skilled therapeutic intervention in order to improve the following deficits and impairments:   Body Structure / Function / Physical Skills: ADL, UE functional use, Endurance, Balance, Flexibility, Pain, FMC, ROM, Coordination, GMC, Decreased knowledge of precautions, Sensation, Scar mobility, Decreased knowledge of use of DME, IADL, Skin integrity, Dexterity, Strength, Mobility, Edema       Visit Diagnosis: Stiffness of right wrist, not elsewhere classified  Muscle weakness (generalized)  Other lack of coordination  Pain in right wrist  Localized edema    Problem List Patient Active Problem List   Diagnosis Date Noted   Fall 07/09/2021    Collier SalinaGellert, Shaletha Humble M,  OT 11/18/2021, 4:33 PM  [facility] [address] [place], [place], [address] Phone: [phone]   Fax:  [phone]  Name: Craig Church MRN: [id number] Date of Birth: 1966-02-06

## 2021-11-19 DIAGNOSIS — M25531 Pain in right wrist: Secondary | ICD-10-CM | POA: Diagnosis present

## 2021-11-19 DIAGNOSIS — M6281 Muscle weakness (generalized): Secondary | ICD-10-CM | POA: Diagnosis present

## 2021-11-19 DIAGNOSIS — R600 Localized edema: Secondary | ICD-10-CM | POA: Diagnosis present

## 2021-11-19 DIAGNOSIS — M25631 Stiffness of right wrist, not elsewhere classified: Secondary | ICD-10-CM | POA: Diagnosis present

## 2021-11-19 DIAGNOSIS — R278 Other lack of coordination: Secondary | ICD-10-CM | POA: Diagnosis present

## 2021-11-22 ENCOUNTER — Encounter: Payer: Self-pay | Admitting: Occupational Therapy

## 2021-11-23 ENCOUNTER — Other Ambulatory Visit: Payer: Self-pay

## 2021-11-23 ENCOUNTER — Ambulatory Visit: Payer: Worker's Compensation | Attending: Family Medicine | Admitting: Occupational Therapy

## 2021-11-23 DIAGNOSIS — M25531 Pain in right wrist: Secondary | ICD-10-CM | POA: Diagnosis present

## 2021-11-23 DIAGNOSIS — M6281 Muscle weakness (generalized): Secondary | ICD-10-CM | POA: Diagnosis present

## 2021-11-23 DIAGNOSIS — M25631 Stiffness of right wrist, not elsewhere classified: Secondary | ICD-10-CM | POA: Insufficient documentation

## 2021-11-23 DIAGNOSIS — R2689 Other abnormalities of gait and mobility: Secondary | ICD-10-CM | POA: Insufficient documentation

## 2021-11-23 DIAGNOSIS — R600 Localized edema: Secondary | ICD-10-CM | POA: Diagnosis present

## 2021-11-23 DIAGNOSIS — R278 Other lack of coordination: Secondary | ICD-10-CM | POA: Insufficient documentation

## 2021-11-23 NOTE — Therapy (Signed)
[facility] [address] [place], [place], [address] Phone: [phone]   Fax:  [phone]  Occupational Therapy Treatment  Patient Details  Name: Craig Church MRN: [id number] Date of Birth: 06-16-1966 Referring Provider (OT): Altha Harm PA-C   Encounter Date: 11/23/2021   OT End of Session - 11/23/21 1356     Visit Number 25    Number of Visits 33    Date for OT Re-Evaluation 12/22/21    Authorization Type workers comp additional 8 visits approved following the inital 16 visits 16+8=24, 8 additional visits approved 11/11/21    Authorization - Visit Number 25    Authorization - Number of Visits 32    OT Start Time 1300    OT Stop Time 1355    OT Time Calculation (min) 55 min    Equipment Utilized During Treatment Gripper    Activity Tolerance Patient tolerated treatment well    Behavior During Therapy WFL for tasks assessed/performed             Past Medical History:  Diagnosis Date   Hypertension    Seizure disorder (HCC)     Past Surgical History:  Procedure Laterality Date   CHOLECYSTECTOMY     OPEN REDUCTION INTERNAL FIXATION (ORIF) DISTAL PHALANX Right 07/12/2021   Procedure: OPEN REDUCTION INTERNAL FIXATION (ORIF) DISTAL RADIUS;  Surgeon: Allena Napoleon, MD;  Location: [facility] OR;  Service: Plastics;  Laterality: Right;    There were no vitals filed for this visit.   Subjective Assessment - 11/23/21 1308     Subjective  Dr. Franky Macho told me I will have to get surgery on my back (lumbar fusion) soon. Dr. Arita Miss has ordered an xray for the hand and wrist    Pertinent History 56 yo male with onset of fall at work off a ladder was brought to hosp on 9/23 and received ORIF for R distal radial fracture on 9/26.  Mildly displaced R ulnar styloid fracture not requiring sx.  Received TLSO for spinal injuries of T12 comp fx, T11 anterior wedging, B inferior articular process and spinous process fractures.  PMHx:   HA, colloid cyst of brain, idiopathic intracranial hypertension, LE edema, lumbar spondylosis and radiculopathy, chestpain, HLD, seizure disorder.    Limitations cleared for ROM and strengthening per MD, back brace on,    Currently in Pain? Yes    Pain Score 3     Pain Location Hand   at MP joints and wrist   Pain Orientation Right    Pain Descriptors / Indicators Aching    Pain Type Chronic pain    Pain Onset More than a month ago    Pain Frequency Intermittent    Aggravating Factors  use    Pain Relieving Factors heat, paraffin              Wrapped hand in composite flexion w/ coban prior to paraffin bath x 15 min.   Discussed use of kinesiotape for edema management - pt reports coming tomorrow, so will try then. Pt also reports that he needs back surgery soon per Dr. Franky Macho.   P/ROM in full composite finger flexion with active and passive wrist flexion/extension. Place and hold ex's in composite flexion w/ emphasis on maintaining MP flexion with IP flexion. Light joint mobs at metacarpals.   Weighted stretch in forearm sup/pronation with hammer.   Gripper set at level 2 resistance to pick up blocks Rt hand for sustained grip strength  OT Short Term Goals - 11/18/21 1631       OT SHORT TERM GOAL #1   Title I with splint wear, care and precautions    Time 6    Period Weeks    Status Achieved    Target Date 09/15/21      OT SHORT TERM GOAL #2   Title I with inital HEP    Time 6    Period Weeks    Status Achieved      OT SHORT TERM GOAL #3   Title Pt will demonstrate ability to perfrom at least 90% composite finger flexion in prep for functional use.    Time 6    Period Weeks    Status Achieved   90%     OT SHORT TERM GOAL #4   Title Pt will demonstrate wrist flexion/ extension of at least 40* in prep for functional use.    Time 6    Period Weeks    Status Achieved   45*/ 40 extension     OT SHORT TERM GOAL #5   Title I  with edema control.    Time 6    Period Weeks    Status Achieved      OT SHORT TERM GOAL #6   Title Pt will demonstrate 50* wrist flexion and 45* wrist extension in prep for functional use    Time 4    Period Weeks    Status On-going      OT SHORT TERM GOAL #7   Title Pt will demonstrate forearm supination/ pronation of 85* in prep for work activities    Time 4    Period Weeks    Status On-going      OT SHORT TERM GOAL #8   Title Pt will demonstrate grip strength of 23 lbs or greater in prep for functional use.    Time 4    Period Weeks    Status Achieved   11/16/21-30.7 lbs     OT SHORT TERM GOAL  #9   TITLE Pt will report increased ease with the following tasks: cutting food, opening containers, and squeezing toothpaste.    Time 4    Period Weeks    Status On-going   Pt reports increased ease with opening continers and squeezing toothpaste              OT Long Term Goals - 11/18/21 1632       OT LONG TERM GOAL #1   Title I with updated HEP.    Time 12    Period Weeks    Status On-going    Target Date 10/27/21      OT LONG TERM GOAL #2   Title Pt will demonstrate grip strength of at least 40 lbs in prep for work activities.    Time 8    Period Weeks    Status On-going   30.7 lbs-11/16/21     OT LONG TERM GOAL #3   Title Pt will demonstrate wrist flexion/ extension WFLS for work activities.    Time 8    Period Weeks    Status On-going      OT LONG TERM GOAL #4   Title Pt will demonstrate forearm supination/ pronation of at least 85* in prep for work.    Time 12    Period Weeks    Status Revised   80/80- now a short term goal     OT LONG TERM GOAL #5   Title  Pt will resume use of RUE at least 90% of the time for ADLS/ IADLS with pain no greater than 3/10.    Time 12    Period Weeks    Status On-going   50% of the time     OT LONG TERM GOAL #6   Title Pt will perform simulated work activities modified independently    Time 12    Period Weeks     Status On-going   unable to perfrom due to pain and back prec.                  Plan - 11/23/21 1357     Clinical Impression Statement Pt progressing with hand ROM following paraffin. Pt also with decreased edema overall. Pt reports he has to have lumbar fusion soon    OT Occupational Profile and History Detailed Assessment- Review of Records and additional review of physical, cognitive, psychosocial history related to current functional performance    Occupational performance deficits (Please refer to evaluation for details): ADL's;IADL's;Work;Social Participation;Rest and Sleep;Leisure    Body Structure / Function / Physical Skills ADL;UE functional use;Endurance;Balance;Flexibility;Pain;FMC;ROM;Coordination;GMC;Decreased knowledge of precautions;Sensation;Scar mobility;Decreased knowledge of use of DME;IADL;Skin integrity;Dexterity;Strength;Mobility;Edema    Rehab Potential Good    Clinical Decision Making Limited treatment options, no task modification necessary    Comorbidities Affecting Occupational Performance: May have comorbidities impacting occupational performance    Modification or Assistance to Complete Evaluation  Min-Moderate modification of tasks or assist with assess necessary to complete eval    OT Frequency 2x / week    OT Duration 8 weeks    OT Treatment/Interventions Self-care/ADL training;Ultrasound;Patient/family education;Scar mobilization;DME and/or AE instruction;Paraffin;Passive range of motion;Fluidtherapy;Cryotherapy;Functional Mobility Training;Splinting;Therapeutic activities;Manual Therapy;Moist Heat;Neuromuscular education    Plan continue paraffin w/ hand wrapped, work on fine motor skills, taping for edema management    Consulted and Agree with Plan of Care Patient             Patient will benefit from skilled therapeutic intervention in order to improve the following deficits and impairments:   Body Structure / Function / Physical Skills: ADL,  UE functional use, Endurance, Balance, Flexibility, Pain, FMC, ROM, Coordination, GMC, Decreased knowledge of precautions, Sensation, Scar mobility, Decreased knowledge of use of DME, IADL, Skin integrity, Dexterity, Strength, Mobility, Edema       Visit Diagnosis: Stiffness of right wrist, not elsewhere classified  Pain in right wrist  Muscle weakness (generalized)  Localized edema    Problem List Patient Active Problem List   Diagnosis Date Noted   Fall 07/09/2021    Kelli Churn, OTR/L 11/23/2021, 1:59 PM  [facility] [address] [place], [place], [address] Phone: [phone]   Fax:  [phone]  Name: Craig Church MRN: [id number] Date of Birth: July 24, 1966

## 2021-11-24 ENCOUNTER — Ambulatory Visit: Payer: Worker's Compensation | Admitting: Occupational Therapy

## 2021-11-24 DIAGNOSIS — R278 Other lack of coordination: Secondary | ICD-10-CM

## 2021-11-24 DIAGNOSIS — M25631 Stiffness of right wrist, not elsewhere classified: Secondary | ICD-10-CM

## 2021-11-24 DIAGNOSIS — R600 Localized edema: Secondary | ICD-10-CM

## 2021-11-24 DIAGNOSIS — M25531 Pain in right wrist: Secondary | ICD-10-CM

## 2021-11-24 NOTE — Therapy (Signed)
[facility] [address] [place], [place], [address] Phone: [phone]   Fax:  [phone]  Occupational Therapy Treatment  Patient Details  Name: Mumin Denomme MRN: [id number] Date of Birth: 1966/05/29 Referring Provider (OT): Altha Harm PA-C   Encounter Date: 11/24/2021   OT End of Session - 11/24/21 1150     Visit Number 26    Number of Visits 33    Date for OT Re-Evaluation 12/22/21    Authorization Type workers comp additional 8 visits approved following the inital 16 visits 16+8=24, 8 additional visits approved 11/11/21    Authorization - Visit Number 26    Authorization - Number of Visits 32    OT Start Time 1102    OT Stop Time 1147    OT Time Calculation (min) 45 min    Activity Tolerance Patient tolerated treatment well    Behavior During Therapy [facility] for tasks assessed/performed             Past Medical History:  Diagnosis Date   Hypertension    Seizure disorder (HCC)     Past Surgical History:  Procedure Laterality Date   CHOLECYSTECTOMY     OPEN REDUCTION INTERNAL FIXATION (ORIF) DISTAL PHALANX Right 07/12/2021   Procedure: OPEN REDUCTION INTERNAL FIXATION (ORIF) DISTAL RADIUS;  Surgeon: Allena Napoleon, MD;  Location: [facility] OR;  Service: Plastics;  Laterality: Right;    There were no vitals filed for this visit.   Subjective Assessment - 11/24/21 1108     Subjective  Dr. Franky Macho told me I will have to get surgery on my back (lumbar fusion) soon. Dr. Arita Miss has ordered an xray for the hand and wrist    Pertinent History 56 yo male with onset of fall at work off a ladder was brought to hosp on 9/23 and received ORIF for R distal radial fracture on 9/26.  Mildly displaced R ulnar styloid fracture not requiring sx.  Received TLSO for spinal injuries of T12 comp fx, T11 anterior wedging, B inferior articular process and spinous process fractures.  PMHx:  HA, colloid cyst of brain, idiopathic  intracranial hypertension, LE edema, lumbar spondylosis and radiculopathy, chestpain, HLD, seizure disorder.    Limitations cleared for ROM and strengthening per MD, back brace on,    Currently in Pain? Yes    Pain Score 2     Pain Location Hand    Pain Orientation Right    Pain Descriptors / Indicators Aching   stiffness   Pain Type Chronic pain    Pain Onset More than a month ago    Pain Frequency Intermittent    Aggravating Factors  use, cold/rainy weather    Pain Relieving Factors heat, paraffin             Wrapped hand in composite flexion w/ coban prior to paraffin bath x 15 min.    P/ROM in full composite finger flexion with active and passive wrist flexion/extension, emphasis on maintaining MP flexion with IP flexion. Light joint mobs at metacarpals.   Pt placing small pegs in pegboard Rt hand while copying design for coordination with min difficulty (pt had previously reported difficulty picking up smaller items/assembling small lego pieces)   Kinesiotaping Rt hand (fingertips down to wrist) dorsally using Y shape for edema management (2 pieces of tape)                          OT Short Term  Goals - 11/18/21 1631       OT SHORT TERM GOAL #1   Title I with splint wear, care and precautions    Time 6    Period Weeks    Status Achieved    Target Date 09/15/21      OT SHORT TERM GOAL #2   Title I with inital HEP    Time 6    Period Weeks    Status Achieved      OT SHORT TERM GOAL #3   Title Pt will demonstrate ability to perfrom at least 90% composite finger flexion in prep for functional use.    Time 6    Period Weeks    Status Achieved   90%     OT SHORT TERM GOAL #4   Title Pt will demonstrate wrist flexion/ extension of at least 40* in prep for functional use.    Time 6    Period Weeks    Status Achieved   45*/ 40 extension     OT SHORT TERM GOAL #5   Title I with edema control.    Time 6    Period Weeks    Status Achieved       OT SHORT TERM GOAL #6   Title Pt will demonstrate 50* wrist flexion and 45* wrist extension in prep for functional use    Time 4    Period Weeks    Status On-going      OT SHORT TERM GOAL #7   Title Pt will demonstrate forearm supination/ pronation of 85* in prep for work activities    Time 4    Period Weeks    Status On-going      OT SHORT TERM GOAL #8   Title Pt will demonstrate grip strength of 23 lbs or greater in prep for functional use.    Time 4    Period Weeks    Status Achieved   11/16/21-30.7 lbs     OT SHORT TERM GOAL  #9   TITLE Pt will report increased ease with the following tasks: cutting food, opening containers, and squeezing toothpaste.    Time 4    Period Weeks    Status On-going   Pt reports increased ease with opening continers and squeezing toothpaste              OT Long Term Goals - 11/18/21 1632       OT LONG TERM GOAL #1   Title I with updated HEP.    Time 12    Period Weeks    Status On-going    Target Date 10/27/21      OT LONG TERM GOAL #2   Title Pt will demonstrate grip strength of at least 40 lbs in prep for work activities.    Time 8    Period Weeks    Status On-going   30.7 lbs-11/16/21     OT LONG TERM GOAL #3   Title Pt will demonstrate wrist flexion/ extension WFLS for work activities.    Time 8    Period Weeks    Status On-going      OT LONG TERM GOAL #4   Title Pt will demonstrate forearm supination/ pronation of at least 85* in prep for work.    Time 12    Period Weeks    Status Revised   80/80- now a short term goal     OT LONG TERM GOAL #5   Title Pt will resume  use of RUE at least 90% of the time for ADLS/ IADLS with pain no greater than 3/10.    Time 12    Period Weeks    Status On-going   50% of the time     OT LONG TERM GOAL #6   Title Pt will perform simulated work activities modified independently    Time 12    Period Weeks    Status On-going   unable to perfrom due to pain and back prec.                   Plan - 11/24/21 1150     Clinical Impression Statement Pt progressing with hand ROM following paraffin. Pt also with decreased edema overall. Pt reports he has to have lumbar fusion soon    OT Occupational Profile and History Detailed Assessment- Review of Records and additional review of physical, cognitive, psychosocial history related to current functional performance    Occupational performance deficits (Please refer to evaluation for details): ADL's;IADL's;Work;Social Participation;Rest and Sleep;Leisure    Body Structure / Function / Physical Skills ADL;UE functional use;Endurance;Balance;Flexibility;Pain;FMC;ROM;Coordination;GMC;Decreased knowledge of precautions;Sensation;Scar mobility;Decreased knowledge of use of DME;IADL;Skin integrity;Dexterity;Strength;Mobility;Edema    Rehab Potential Good    Clinical Decision Making Limited treatment options, no task modification necessary    Comorbidities Affecting Occupational Performance: May have comorbidities impacting occupational performance    Modification or Assistance to Complete Evaluation  Min-Moderate modification of tasks or assist with assess necessary to complete eval    OT Frequency 2x / week    OT Duration 8 weeks    OT Treatment/Interventions Self-care/ADL training;Ultrasound;Patient/family education;Scar mobilization;DME and/or AE instruction;Paraffin;Passive range of motion;Fluidtherapy;Cryotherapy;Functional Mobility Training;Splinting;Therapeutic activities;Manual Therapy;Moist Heat;Neuromuscular education    Plan continue paraffin w/ hand wrapped, assess how taping helped w/ swelling/pain, continue ROM and strengthening    Consulted and Agree with Plan of Care Patient             Patient will benefit from skilled therapeutic intervention in order to improve the following deficits and impairments:   Body Structure / Function / Physical Skills: ADL, UE functional use, Endurance, Balance, Flexibility,  Pain, FMC, ROM, Coordination, GMC, Decreased knowledge of precautions, Sensation, Scar mobility, Decreased knowledge of use of DME, IADL, Skin integrity, Dexterity, Strength, Mobility, Edema       Visit Diagnosis: Stiffness of right wrist, not elsewhere classified  Localized edema  Pain in right wrist  Other lack of coordination    Problem List Patient Active Problem List   Diagnosis Date Noted   Fall 07/09/2021    Kelli Churn, OTR/L 11/24/2021, 11:52 AM  [facility] [address] [place], [place], [address] Phone: [phone]   Fax:  [phone]  Name: Pranay Hilbun MRN: [id number] Date of Birth: 02/06/1966

## 2021-11-30 ENCOUNTER — Other Ambulatory Visit: Payer: Self-pay

## 2021-11-30 ENCOUNTER — Ambulatory Visit: Payer: Worker's Compensation | Admitting: Occupational Therapy

## 2021-11-30 ENCOUNTER — Encounter: Payer: Self-pay | Admitting: Orthopaedic Surgery

## 2021-11-30 ENCOUNTER — Encounter: Payer: Self-pay | Admitting: Occupational Therapy

## 2021-11-30 ENCOUNTER — Ambulatory Visit (INDEPENDENT_AMBULATORY_CARE_PROVIDER_SITE_OTHER): Payer: Worker's Compensation

## 2021-11-30 ENCOUNTER — Ambulatory Visit (INDEPENDENT_AMBULATORY_CARE_PROVIDER_SITE_OTHER): Payer: Worker's Compensation | Admitting: Orthopaedic Surgery

## 2021-11-30 DIAGNOSIS — M25511 Pain in right shoulder: Secondary | ICD-10-CM

## 2021-11-30 DIAGNOSIS — G8929 Other chronic pain: Secondary | ICD-10-CM

## 2021-11-30 DIAGNOSIS — R600 Localized edema: Secondary | ICD-10-CM

## 2021-11-30 DIAGNOSIS — M6281 Muscle weakness (generalized): Secondary | ICD-10-CM

## 2021-11-30 DIAGNOSIS — M25631 Stiffness of right wrist, not elsewhere classified: Secondary | ICD-10-CM

## 2021-11-30 DIAGNOSIS — R2689 Other abnormalities of gait and mobility: Secondary | ICD-10-CM

## 2021-11-30 DIAGNOSIS — R278 Other lack of coordination: Secondary | ICD-10-CM

## 2021-11-30 DIAGNOSIS — M25531 Pain in right wrist: Secondary | ICD-10-CM

## 2021-11-30 NOTE — Progress Notes (Signed)
Office Visit Note   Patient: Craig Church           Date of Birth: [birth year]           MRN: [id number] Visit Date: 11/30/2021              Requested by: Craig Napoleon, MD [address] [place],  [place] [address] PCP: Craig Manson, NP   Assessment & Plan: Visit Diagnoses:  1. Chronic right shoulder pain     Plan: Recommend physical therapy for his right shoulder to work on range of motion, strengthening include home exercise program and modalities.  See him back in follow-up 6 weeks to see how he is doing overall.  Questions were encouraged and answered by Dr. Magnus Ivan and myself.  He may benefit from a subacromial injection in the future however due to the fact that he has upcoming surgery this Friday for what sounds like kyphoplasty would not recommend cortisone injection at this time.  Follow-Up Instructions: Return in about 6 weeks (around 01/11/2022).   Orders:  Orders Placed This Encounter  Procedures   XR Shoulder Right   No orders of the defined types were placed in this encounter.     Procedures: No procedures performed   Clinical Data: No additional findings.   Subjective: Chief Complaint  Patient presents with   Right Shoulder - Pain    HPI History she is a 56 year old male comes in today for right shoulder pain.  He is involved in an accident where he fell back in September approximately 23 feet and sustained a right wrist fracture, T12 compression fracture T11 anterior wedging and T11 spinous process fracture.  He has had right shoulder pain since the injury.  He notes some pain in his shoulder with range of motion.  No prior surgery on the right shoulder.  Review of Systems  Constitutional:  Negative for chills and fever.    Objective: Vital Signs: There were no vitals taken for this visit.  Physical Exam Constitutional:      Appearance: He is not ill-appearing or diaphoretic.  Pulmonary:     Effort: Pulmonary effort is  normal.  Neurological:     Mental Status: He is alert and oriented to person, place, and time.    Ortho Exam Bilateral shoulders 5 / 5 strength external and internal rotation is resistance.  Empty can test is negative bilaterally.  Liftoff test negative bilaterally.  Right shoulder positive impingement.  Overhead activity is full bilaterally actively but causes discomfort in the right shoulder.  Passive range of motion right shoulder reveals no significant crepitus.   Specialty Comments:  No specialty comments available.  Imaging: XR Shoulder Right  Result Date: 11/30/2021 Right shoulder 3 views: No acute fracture.  Shoulders well located.  Glenohumeral joints well-maintained.  Subacromial space is well-maintained.  No significant arthritic changes    PMFS History: Patient Active Problem List   Diagnosis Date Noted   Fall 07/09/2021   Arthritis, multiple joint involvement 09/24/2019   Class 1 obesity due to excess calories with serious comorbidity and body mass index (BMI) of 32.0 to 32.9 in adult 09/24/2019   Essential hypertension 09/24/2019   Mixed hyperlipidemia 09/24/2019   Restless legs syndrome 09/24/2019   Allergic rhinitis 11/25/2013   FHx: heart disease 11/25/2013   Past Medical History:  Diagnosis Date   Hypertension    Seizure disorder (HCC)     History reviewed. No pertinent  family history.  Past Surgical History:  Procedure Laterality Date   CHOLECYSTECTOMY     OPEN REDUCTION INTERNAL FIXATION (ORIF) DISTAL PHALANX Right 07/12/2021   Procedure: OPEN REDUCTION INTERNAL FIXATION (ORIF) DISTAL RADIUS;  Surgeon: Craig Napoleon, MD;  Location: [facility] OR;  Service: Plastics;  Laterality: Right;   Social History   Occupational History   Not on file  Tobacco Use   Smoking status: Never   Smokeless tobacco: Current    Types: Chew  Substance and Sexual Activity   Alcohol use: Never   Drug use: Never   Sexual activity: Not on file

## 2021-11-30 NOTE — Therapy (Signed)
[facility] [address] [place], [place], [address] Phone: [phone]   Fax:  [phone]  Occupational Therapy Treatment  Patient Details  Name: Craig Church MRN: [id number] Date of Birth: 1966-08-24 Referring Provider (OT): Altha Harm PA-C   Encounter Date: 11/30/2021   OT End of Session - 11/30/21 1409     Visit Number 27    Number of Visits 33    Date for OT Re-Evaluation 12/22/21    Authorization Type workers comp additional 8 visits approved following the inital 16 visits 16+8=24, 8 additional visits approved 11/11/21    Authorization - Visit Number 27    Authorization - Number of Visits 32    OT Start Time 1351    OT Stop Time 1440    OT Time Calculation (min) 49 min    Activity Tolerance Patient tolerated treatment well    Behavior During Therapy WFL for tasks assessed/performed             Past Medical History:  Diagnosis Date   Hypertension    Seizure disorder (HCC)     Past Surgical History:  Procedure Laterality Date   CHOLECYSTECTOMY     OPEN REDUCTION INTERNAL FIXATION (ORIF) DISTAL PHALANX Right 07/12/2021   Procedure: OPEN REDUCTION INTERNAL FIXATION (ORIF) DISTAL RADIUS;  Surgeon: Allena Napoleon, MD;  Location: [facility] OR;  Service: Plastics;  Laterality: Right;    There were no vitals filed for this visit.   Subjective Assessment - 11/30/21 1605     Subjective  pt reports he is having surgery on Friday    Pertinent History 56 yo male with onset of fall at work off a ladder was brought to hosp on 9/23 and received ORIF for R distal radial fracture on 9/26.  Mildly displaced R ulnar styloid fracture not requiring sx.  Received TLSO for spinal injuries of T12 comp fx, T11 anterior wedging, B inferior articular process and spinous process fractures.  PMHx:  HA, colloid cyst of brain, idiopathic intracranial hypertension, LE edema, lumbar spondylosis and radiculopathy, chestpain, HLD, seizure  disorder.    Limitations cleared for ROM and strengthening per MD, back brace on,    Patient Stated Goals custom splint    Currently in Pain? Yes    Pain Score 2     Pain Location Hand    Pain Orientation Right    Pain Descriptors / Indicators Aching    Pain Type Chronic pain    Pain Onset More than a month ago    Pain Frequency Intermittent    Aggravating Factors  use, cold and rainy weather    Pain Relieving Factors heat, parraffin                   Wrapped hand in composite flexion w/ coban prior to paraffin bath x 10 min.   A/ROM composite flexion and extension  Prayer position fro wrist extension then stretch at wall(pt was unable to tolerate stretch over tabletop due to pain).   P/ROM in full composite finger flexion with active wrist flexion/extension, emphasis on maintaining MP flexion with IP flexion.   Wrist flexion/ extension and ulnar deviation with 2 lbs weight 10-15 reps each  Digi flex for composite flexion and isolated individual flexion  Kinesiotaping Rt hand (fingertips down to wrist) dorsally using Y shape for edema management (2 pieces of tape) for edema management  OT Short Term Goals - 11/18/21 1631       OT SHORT TERM GOAL #1   Title I with splint wear, care and precautions    Time 6    Period Weeks    Status Achieved    Target Date 09/15/21      OT SHORT TERM GOAL #2   Title I with inital HEP    Time 6    Period Weeks    Status Achieved      OT SHORT TERM GOAL #3   Title Pt will demonstrate ability to perfrom at least 90% composite finger flexion in prep for functional use.    Time 6    Period Weeks    Status Achieved   90%     OT SHORT TERM GOAL #4   Title Pt will demonstrate wrist flexion/ extension of at least 40* in prep for functional use.    Time 6    Period Weeks    Status Achieved   45*/ 40 extension     OT SHORT TERM GOAL #5   Title I with edema control.    Time 6    Period Weeks     Status Achieved      OT SHORT TERM GOAL #6   Title Pt will demonstrate 50* wrist flexion and 45* wrist extension in prep for functional use    Time 4    Period Weeks    Status On-going      OT SHORT TERM GOAL #7   Title Pt will demonstrate forearm supination/ pronation of 85* in prep for work activities    Time 4    Period Weeks    Status On-going      OT SHORT TERM GOAL #8   Title Pt will demonstrate grip strength of 23 lbs or greater in prep for functional use.    Time 4    Period Weeks    Status Achieved   11/16/21-30.7 lbs     OT SHORT TERM GOAL  #9   TITLE Pt will report increased ease with the following tasks: cutting food, opening containers, and squeezing toothpaste.    Time 4    Period Weeks    Status On-going   Pt reports increased ease with opening continers and squeezing toothpaste              OT Long Term Goals - 11/18/21 1632       OT LONG TERM GOAL #1   Title I with updated HEP.    Time 12    Period Weeks    Status On-going    Target Date 10/27/21      OT LONG TERM GOAL #2   Title Pt will demonstrate grip strength of at least 40 lbs in prep for work activities.    Time 8    Period Weeks    Status On-going   30.7 lbs-11/16/21     OT LONG TERM GOAL #3   Title Pt will demonstrate wrist flexion/ extension WFLS for work activities.    Time 8    Period Weeks    Status On-going      OT LONG TERM GOAL #4   Title Pt will demonstrate forearm supination/ pronation of at least 85* in prep for work.    Time 12    Period Weeks    Status Revised   80/80- now a short term goal     OT LONG TERM GOAL #5   Title  Pt will resume use of RUE at least 90% of the time for ADLS/ IADLS with pain no greater than 3/10.    Time 12    Period Weeks    Status On-going   50% of the time     OT LONG TERM GOAL #6   Title Pt will perform simulated work activities modified independently    Time 12    Period Weeks    Status On-going   unable to perfrom due to pain and  back prec.                  Plan - 11/30/21 1425     Clinical Impression Statement Pt progressing with hand ROM following paraffin. Pt also with decreased edema overall. Pt reports that the kinesiotape worked really well and his mobility was increased this weekend.    OT Occupational Profile and History Detailed Assessment- Review of Records and additional review of physical, cognitive, psychosocial history related to current functional performance    Occupational performance deficits (Please refer to evaluation for details): ADL's;IADL's;Work;Social Participation;Rest and Sleep;Leisure    Body Structure / Function / Physical Skills ADL;UE functional use;Endurance;Balance;Flexibility;Pain;FMC;ROM;Coordination;GMC;Decreased knowledge of precautions;Sensation;Scar mobility;Decreased knowledge of use of DME;IADL;Skin integrity;Dexterity;Strength;Mobility;Edema    Rehab Potential Good    Clinical Decision Making Limited treatment options, no task modification necessary    Comorbidities Affecting Occupational Performance: May have comorbidities impacting occupational performance    Modification or Assistance to Complete Evaluation  Min-Moderate modification of tasks or assist with assess necessary to complete eval    OT Frequency 2x / week    OT Duration 8 weeks    OT Treatment/Interventions Self-care/ADL training;Ultrasound;Patient/family education;Scar mobilization;DME and/or AE instruction;Paraffin;Passive range of motion;Fluidtherapy;Cryotherapy;Functional Mobility Training;Splinting;Therapeutic activities;Manual Therapy;Moist Heat;Neuromuscular education    Plan , continue ROM and strengthening, pt is having surgery on Friday    Consulted and Agree with Plan of Care Patient             Patient will benefit from skilled therapeutic intervention in order to improve the following deficits and impairments:   Body Structure / Function / Physical Skills: ADL, UE functional use,  Endurance, Balance, Flexibility, Pain, FMC, ROM, Coordination, GMC, Decreased knowledge of precautions, Sensation, Scar mobility, Decreased knowledge of use of DME, IADL, Skin integrity, Dexterity, Strength, Mobility, Edema       Visit Diagnosis: Stiffness of right wrist, not elsewhere classified  Localized edema  Pain in right wrist  Other lack of coordination  Muscle weakness (generalized)  Other abnormalities of gait and mobility    Problem List Patient Active Problem List   Diagnosis Date Noted   Fall 07/09/2021   Arthritis, multiple joint involvement 09/24/2019   Class 1 obesity due to excess calories with serious comorbidity and body mass index (BMI) of 32.0 to 32.9 in adult 09/24/2019   Essential hypertension 09/24/2019   Mixed hyperlipidemia 09/24/2019   Restless legs syndrome 09/24/2019   Allergic rhinitis 11/25/2013   FHx: heart disease 11/25/2013    Joyce Heitman, OT 11/30/2021, 4:07 PM  [facility] [address] [place], [place], [address] Phone: [phone]   Fax:  [phone]  Name: Craig Church MRN: [id number] Date of Birth: Apr 21, 1966

## 2021-12-02 ENCOUNTER — Other Ambulatory Visit: Payer: Self-pay

## 2021-12-02 ENCOUNTER — Ambulatory Visit: Payer: Worker's Compensation | Attending: Family Medicine | Admitting: Occupational Therapy

## 2021-12-02 ENCOUNTER — Encounter: Payer: Self-pay | Admitting: Occupational Therapy

## 2021-12-02 DIAGNOSIS — R600 Localized edema: Secondary | ICD-10-CM | POA: Diagnosis present

## 2021-12-02 DIAGNOSIS — M25531 Pain in right wrist: Secondary | ICD-10-CM | POA: Diagnosis present

## 2021-12-02 DIAGNOSIS — R2689 Other abnormalities of gait and mobility: Secondary | ICD-10-CM | POA: Insufficient documentation

## 2021-12-02 DIAGNOSIS — R278 Other lack of coordination: Secondary | ICD-10-CM | POA: Diagnosis present

## 2021-12-02 DIAGNOSIS — M25631 Stiffness of right wrist, not elsewhere classified: Secondary | ICD-10-CM | POA: Diagnosis present

## 2021-12-02 DIAGNOSIS — M6281 Muscle weakness (generalized): Secondary | ICD-10-CM | POA: Insufficient documentation

## 2021-12-02 NOTE — Patient Instructions (Signed)
Dr. Mikal Plane,   Craig Church has been receiving OT at our site to address his right wrist fracture for hand and wrist ROM and strengthening. He is also going to receive therapy for his right shoulder.  After surgery he will need written resume therapy orders for occupational therapy We will also need any activity or lifting restrictions or precautions.  Thanks for your assistance so that he can continue with OT.  Sincerely,   Keene Breath, OTR/L

## 2021-12-02 NOTE — Therapy (Signed)
[facility] [address] [place], [place], [address] Phone: [phone]   Fax:  [phone]  Occupational Therapy Treatment  Patient Details  Name: Craig Church MRN: [id number] Date of Birth: 10-09-1966 Referring Provider (OT): Altha Harm PA-C   Encounter Date: 12/02/2021   OT End of Session - 12/02/21 1420     Visit Number 28    Number of Visits 33    Date for OT Re-Evaluation 12/22/21    Authorization Type workers comp additional 8 visits approved following the inital 16 visits 16+8=24, 8 additional visits approved 11/11/21    Authorization - Visit Number 28    Authorization - Number of Visits 32    OT Start Time 1405    OT Stop Time 1445    OT Time Calculation (min) 40 min             Past Medical History:  Diagnosis Date   Hypertension    Seizure disorder (HCC)     Past Surgical History:  Procedure Laterality Date   CHOLECYSTECTOMY     OPEN REDUCTION INTERNAL FIXATION (ORIF) DISTAL PHALANX Right 07/12/2021   Procedure: OPEN REDUCTION INTERNAL FIXATION (ORIF) DISTAL RADIUS;  Surgeon: Allena Napoleon, MD;  Location: [facility] OR;  Service: Plastics;  Laterality: Right;    There were no vitals filed for this visit.   Subjective Assessment - 12/02/21 1508     Pertinent History 56 yo male with onset of fall at work off a ladder was brought to hosp on 9/23 and received ORIF for R distal radial fracture on 9/26.  Mildly displaced R ulnar styloid fracture not requiring sx.  Received TLSO for spinal injuries of T12 comp fx, T11 anterior wedging, B inferior articular process and spinous process fractures.  PMHx:  HA, colloid cyst of brain, idiopathic intracranial hypertension, LE edema, lumbar spondylosis and radiculopathy, chestpain, HLD, seizure disorder.    Limitations cleared for ROM and strengthening per MD, back brace on,    Patient Stated Goals custom splint    Currently in Pain? Yes    Pain Score 2     Pain  Location Hand    Pain Orientation Right    Pain Descriptors / Indicators Aching    Pain Type Chronic pain    Pain Onset More than a month ago    Pain Frequency Intermittent    Aggravating Factors  use, cold and rainy    Pain Relieving Factors heat, paraffin, kinesiotape    Multiple Pain Sites Yes              Treatment: Pt reports decreased pain and edema following kinesiotape.  A/ROM composite flexion and extension.  Joint mobs to individual digits at MP joints along with passive composite flexion   Prayer position for wrist extension    A/ROM in full composite finger flexion with active wrist flexion/extension, emphasis on maintaining MP flexion with IP flexion.   Wrist winder with 1 lbs weight x 2 reps up and down  Forearm gym  x 4 reps for increased A/ROM wrist and digits  Fine motor coordination placing and removing grooved pegs from pegboard with RUE, min difficulty/ drops, removing with in hand manipulation  Placing grooved pegs on vertical antennae with RUE for sustained pinch 1-8 #, min difficulty  Note sent with pt. to take to Dr. Mikal Plane requesting resume therapy orders after pt's surgery  OT Short Term Goals - 11/18/21 1631       OT SHORT TERM GOAL #1   Title I with splint wear, care and precautions    Time 6    Period Weeks    Status Achieved    Target Date 09/15/21      OT SHORT TERM GOAL #2   Title I with inital HEP    Time 6    Period Weeks    Status Achieved      OT SHORT TERM GOAL #3   Title Pt will demonstrate ability to perfrom at least 90% composite finger flexion in prep for functional use.    Time 6    Period Weeks    Status Achieved   90%     OT SHORT TERM GOAL #4   Title Pt will demonstrate wrist flexion/ extension of at least 40* in prep for functional use.    Time 6    Period Weeks    Status Achieved   45*/ 40 extension     OT SHORT TERM GOAL #5   Title I with edema control.    Time 6     Period Weeks    Status Achieved      OT SHORT TERM GOAL #6   Title Pt will demonstrate 50* wrist flexion and 45* wrist extension in prep for functional use    Time 4    Period Weeks    Status On-going      OT SHORT TERM GOAL #7   Title Pt will demonstrate forearm supination/ pronation of 85* in prep for work activities    Time 4    Period Weeks    Status On-going      OT SHORT TERM GOAL #8   Title Pt will demonstrate grip strength of 23 lbs or greater in prep for functional use.    Time 4    Period Weeks    Status Achieved   11/16/21-30.7 lbs     OT SHORT TERM GOAL  #9   TITLE Pt will report increased ease with the following tasks: cutting food, opening containers, and squeezing toothpaste.    Time 4    Period Weeks    Status On-going   Pt reports increased ease with opening continers and squeezing toothpaste              OT Long Term Goals - 11/18/21 1632       OT LONG TERM GOAL #1   Title I with updated HEP.    Time 12    Period Weeks    Status On-going    Target Date 10/27/21      OT LONG TERM GOAL #2   Title Pt will demonstrate grip strength of at least 40 lbs in prep for work activities.    Time 8    Period Weeks    Status On-going   30.7 lbs-11/16/21     OT LONG TERM GOAL #3   Title Pt will demonstrate wrist flexion/ extension WFLS for work activities.    Time 8    Period Weeks    Status On-going      OT LONG TERM GOAL #4   Title Pt will demonstrate forearm supination/ pronation of at least 85* in prep for work.    Time 12    Period Weeks    Status Revised   80/80- now a short term goal     OT LONG TERM GOAL #5   Title  Pt will resume use of RUE at least 90% of the time for ADLS/ IADLS with pain no greater than 3/10.    Time 12    Period Weeks    Status On-going   50% of the time     OT LONG TERM GOAL #6   Title Pt will perform simulated work activities modified independently    Time 12    Period Weeks    Status On-going   unable to  perfrom due to pain and back prec.                  Plan - 12/02/21 1514     Clinical Impression Statement Pt progressing with hand ROM following kinesiotape. Pt demonstrates significant improvements in edema and pain today.    OT Occupational Profile and History Detailed Assessment- Review of Records and additional review of physical, cognitive, psychosocial history related to current functional performance    Occupational performance deficits (Please refer to evaluation for details): ADL's;IADL's;Work;Social Participation;Rest and Sleep;Leisure    Body Structure / Function / Physical Skills ADL;UE functional use;Endurance;Balance;Flexibility;Pain;FMC;ROM;Coordination;GMC;Decreased knowledge of precautions;Sensation;Scar mobility;Decreased knowledge of use of DME;IADL;Skin integrity;Dexterity;Strength;Mobility;Edema    Rehab Potential Good    Clinical Decision Making Limited treatment options, no task modification necessary    Comorbidities Affecting Occupational Performance: May have comorbidities impacting occupational performance    Modification or Assistance to Complete Evaluation  Min-Moderate modification of tasks or assist with assess necessary to complete eval    OT Frequency 2x / week    OT Duration 8 weeks    OT Treatment/Interventions Self-care/ADL training;Ultrasound;Patient/family education;Scar mobilization;DME and/or AE instruction;Paraffin;Passive range of motion;Fluidtherapy;Cryotherapy;Functional Mobility Training;Splinting;Therapeutic activities;Manual Therapy;Moist Heat;Neuromuscular education    Plan Pt is placed on hold until after back surgery, resume therapy once clearance received.    Consulted and Agree with Plan of Care Patient             Patient will benefit from skilled therapeutic intervention in order to improve the following deficits and impairments:   Body Structure / Function / Physical Skills: ADL, UE functional use, Endurance, Balance,  Flexibility, Pain, FMC, ROM, Coordination, GMC, Decreased knowledge of precautions, Sensation, Scar mobility, Decreased knowledge of use of DME, IADL, Skin integrity, Dexterity, Strength, Mobility, Edema       Visit Diagnosis: Stiffness of right wrist, not elsewhere classified  Localized edema  Pain in right wrist  Other lack of coordination  Muscle weakness (generalized)  Other abnormalities of gait and mobility    Problem List Patient Active Problem List   Diagnosis Date Noted   Fall 07/09/2021   Arthritis, multiple joint involvement 09/24/2019   Class 1 obesity due to excess calories with serious comorbidity and body mass index (BMI) of 32.0 to 32.9 in adult 09/24/2019   Essential hypertension 09/24/2019   Mixed hyperlipidemia 09/24/2019   Restless legs syndrome 09/24/2019   Allergic rhinitis 11/25/2013   FHx: heart disease 11/25/2013    Kimbley Sprague, OT 12/02/2021, 3:19 PM  [facility] [address] [place], [place], [address] Phone: [phone]   Fax:  [phone]  Name: Khameron Craine MRN: [id number] Date of Birth: [birth year]

## 2021-12-07 ENCOUNTER — Ambulatory Visit: Payer: Worker's Compensation | Admitting: Occupational Therapy

## 2021-12-09 ENCOUNTER — Ambulatory Visit: Payer: Worker's Compensation | Admitting: Occupational Therapy

## 2021-12-14 ENCOUNTER — Ambulatory Visit: Payer: Worker's Compensation | Admitting: Occupational Therapy

## 2021-12-15 ENCOUNTER — Other Ambulatory Visit: Payer: Self-pay

## 2021-12-15 ENCOUNTER — Telehealth: Payer: Self-pay | Admitting: Orthopaedic Surgery

## 2021-12-15 DIAGNOSIS — M25511 Pain in right shoulder: Secondary | ICD-10-CM

## 2021-12-15 DIAGNOSIS — G8929 Other chronic pain: Secondary | ICD-10-CM

## 2021-12-15 NOTE — Telephone Encounter (Signed)
The OT referral is in. Can you please call to get pt scheduled? ?

## 2021-12-15 NOTE — Telephone Encounter (Signed)
Pt called to verify orders for P.T went through. Pt will call facility referral was sent to but asked that we also send another order if possible so he can get P.T started. The best call back number is [phone] ?

## 2021-12-16 ENCOUNTER — Ambulatory Visit: Payer: Worker's Compensation | Admitting: Occupational Therapy

## 2021-12-21 ENCOUNTER — Ambulatory Visit: Payer: Worker's Compensation | Admitting: Occupational Therapy

## 2021-12-21 ENCOUNTER — Ambulatory Visit (HOSPITAL_COMMUNITY)
Admission: RE | Admit: 2021-12-21 | Discharge: 2021-12-21 | Disposition: A | Discharge status: Home / Self Care | Payer: Worker's Compensation | Source: Ambulatory Visit | Attending: Plastic Surgery | Admitting: Plastic Surgery

## 2021-12-21 ENCOUNTER — Other Ambulatory Visit: Payer: Self-pay

## 2021-12-21 DIAGNOSIS — S52501A Unspecified fracture of the lower end of right radius, initial encounter for closed fracture: Secondary | ICD-10-CM | POA: Diagnosis present

## 2021-12-22 ENCOUNTER — Ambulatory Visit: Payer: Worker's Compensation | Attending: Family Medicine | Admitting: Occupational Therapy

## 2021-12-22 DIAGNOSIS — M25611 Stiffness of right shoulder, not elsewhere classified: Secondary | ICD-10-CM | POA: Diagnosis present

## 2021-12-22 DIAGNOSIS — M25511 Pain in right shoulder: Secondary | ICD-10-CM | POA: Diagnosis not present

## 2021-12-22 DIAGNOSIS — R600 Localized edema: Secondary | ICD-10-CM | POA: Insufficient documentation

## 2021-12-22 DIAGNOSIS — M25531 Pain in right wrist: Secondary | ICD-10-CM | POA: Diagnosis present

## 2021-12-22 DIAGNOSIS — M25631 Stiffness of right wrist, not elsewhere classified: Secondary | ICD-10-CM | POA: Insufficient documentation

## 2021-12-22 DIAGNOSIS — G8929 Other chronic pain: Secondary | ICD-10-CM | POA: Diagnosis present

## 2021-12-22 DIAGNOSIS — M6281 Muscle weakness (generalized): Secondary | ICD-10-CM | POA: Diagnosis present

## 2021-12-22 DIAGNOSIS — R2689 Other abnormalities of gait and mobility: Secondary | ICD-10-CM | POA: Insufficient documentation

## 2021-12-22 DIAGNOSIS — R278 Other lack of coordination: Secondary | ICD-10-CM

## 2021-12-23 ENCOUNTER — Other Ambulatory Visit: Payer: Self-pay

## 2021-12-23 ENCOUNTER — Ambulatory Visit: Payer: Worker's Compensation | Admitting: Occupational Therapy

## 2021-12-23 ENCOUNTER — Encounter: Payer: Self-pay | Admitting: Occupational Therapy

## 2021-12-23 DIAGNOSIS — M25611 Stiffness of right shoulder, not elsewhere classified: Secondary | ICD-10-CM

## 2021-12-23 DIAGNOSIS — M6281 Muscle weakness (generalized): Secondary | ICD-10-CM

## 2021-12-23 DIAGNOSIS — R278 Other lack of coordination: Secondary | ICD-10-CM

## 2021-12-23 DIAGNOSIS — R600 Localized edema: Secondary | ICD-10-CM

## 2021-12-23 DIAGNOSIS — M25631 Stiffness of right wrist, not elsewhere classified: Secondary | ICD-10-CM

## 2021-12-23 DIAGNOSIS — G8929 Other chronic pain: Secondary | ICD-10-CM

## 2021-12-23 DIAGNOSIS — R2689 Other abnormalities of gait and mobility: Secondary | ICD-10-CM

## 2021-12-23 DIAGNOSIS — M25531 Pain in right wrist: Secondary | ICD-10-CM

## 2021-12-23 DIAGNOSIS — M25511 Pain in right shoulder: Secondary | ICD-10-CM | POA: Diagnosis not present

## 2021-12-23 NOTE — Patient Instructions (Signed)
? ? ? ? ?  Shoulder: Flexion (Supine) ? ? ? ?With hands shoulder width apart, slowly lower dowel to floor behind head. Do not let elbows bend. Keep back flat. Stop at 90* do not go above eye level Hold _5___ seconds. Repeat __10__ times. Do _1-2___ sessions per day. ?CAUTION: Stretch slowly and gently. ? ?Copyright ? VHI. All rights reserved.  ?SCAPULA: Retraction ? ? ? ?Hold cane with both hands. Pinch shoulder blades together. Do not shrug shoulders. Hold _5__ seconds. Do not arch back__10_ reps per set, __1-2_ sets per day, __7_ days per week ? ?Copyright ? VHI. All rights reserved.  ? ? ? ? ?Kinesiotape wear  ? ?You can wear the kinesiotape for 3-5 days. ?If you have increased pain or an allergic reaction, wet the tape in the shower and remove very slowly. ?Do not scrub over tape and just pat dry after a shower ? ? ?

## 2021-12-24 NOTE — Therapy (Signed)
[facility] [address] [place], [place], [address] Phone: [phone]   Fax:  [phone]  Occupational Therapy Treatment  Patient Details  Name: Craig Church MRN: [id number] Date of Birth: 12/30/1965 Referring Provider (OT): Dr. Maryan Puls. [name] (Dr. Rush Farmer for shoulder, Dr. [name] for wrist)   Encounter Date: 12/23/2021   OT End of Session - 12/24/21 1317     Visit Number 30    Number of Visits 45    Date for OT Re-Evaluation 02/17/22    Authorization Type workers comp additional 8 visits approved following the inital 16 visits 16+8=24, 8 additional visits approved 11/11/21    Authorization Time Period awaiting for additional visits auth 12/22/21, verbal consent received to proceed with tx of shoulder    Authorization - Visit Number 30   corrected count   Authorization - Number of Visits 32    OT Start Time 1450    OT Stop Time 1603    OT Time Calculation (min) 73 min    Activity Tolerance Patient tolerated treatment well    Behavior During Therapy [facility] for tasks assessed/performed             Past Medical History:  Diagnosis Date   Hypertension    Seizure disorder ([facility])     Past Surgical History:  Procedure Laterality Date   CHOLECYSTECTOMY     OPEN REDUCTION INTERNAL FIXATION (ORIF) DISTAL PHALANX Right 07/12/2021   Procedure: OPEN REDUCTION INTERNAL FIXATION (ORIF) DISTAL RADIUS;  Surgeon: Cindra Presume, MD;  Location: [facility];  Service: Plastics;  Laterality: Right;    There were no vitals filed for this visit.   Subjective Assessment - 12/24/21 1321     Subjective  Pt reports back pain following recent surgery    Pertinent History 56 yo male with onset of fall at work off a ladder was brought to [facility] on 9/23 and received ORIF for R distal radial fracture on 9/26.  Mildly displaced R ulnar styloid fracture not requiring sx.  Received TLSO for spinal injuries of T12 comp fx, T11 anterior wedging, B  inferior articular process and spinous process fractures.  PMHx:  HA, colloid cyst of brain, idiopathic intracranial hypertension, LE edema, lumbar spondylosis and radiculopathy, chestpain, HLD, seizure disorder.    Limitations cleared for ROM and strengthening per MD, back brace on,    Patient Stated Goals custom splint    Currently in Pain? Yes    Pain Score 2     Pain Location Shoulder   hand   Pain Orientation Right    Pain Descriptors / Indicators Aching    Pain Type Chronic pain    Pain Onset More than a month ago    Pain Frequency Intermittent    Aggravating Factors  malpositioning, reaching    Pain Relieving Factors heat, [place]    Pain Score 6    Pain Location Back    Pain Orientation Right    Pain Descriptors / Indicators Aching    Pain Onset 1 to 4 weeks ago    Pain Frequency Constant    Aggravating Factors  recent surgery    Pain Relieving Factors rest    Pain Onset 1 to 4 weeks ago                     Treatment: Paraffin to right hand x 10 mins, for stiffness while [place] to right anterior and posterior shoulder x 10 mins 3 mhz, 8.0 w/cm 2,  20%, no adverse reactions. A/ROM Composite flexion, and IP flexion. Passive MP flexion and joint mobs then place and holds to right hand. A/ROM wrist flexion/ extension, then with 2 lbs weight, P/ROM wrist extension. Education regarding shoulder positioning to minimize pain. Kinesiotape applied to relax pects, and facilitate shoulder and scapular retraction.              OT Education - 12/24/21 1320     Education Details Kinesiotape wear, care and precautions, HEP for shoulder see pt instructions.    Person(s) Educated Patient    Methods Explanation;Demonstration;Verbal cues;Handout    Comprehension Verbalized understanding;Returned demonstration              OT Short Term Goals - 12/24/21 0750       OT SHORT TERM GOAL #1   Title I with splint wear, care and precautions    Status Achieved      OT SHORT  TERM GOAL #2   Title I with inital HEP    Status Achieved      OT SHORT TERM GOAL #3   Title Pt will demonstrate ability to perfrom at least 90% composite finger flexion in prep for functional use.    Status Achieved   90%     OT SHORT TERM GOAL #4   Title Pt will demonstrate wrist flexion/ extension of at least 40* in prep for functional use.    Period Weeks    Status Achieved   45*/ 40 extension     OT SHORT TERM GOAL #5   Title I with edema control.    Status Achieved      Additional Short Term Goals   Additional Short Term Goals Yes      OT SHORT TERM GOAL #6   Title Pt will demonstrate 50* wrist flexion and 45* wrist extension in prep for functional use    Status Achieved   met 55 for flex, 50 for ext     OT SHORT TERM GOAL #7   Title Pt will demonstrate forearm supination/ pronation of 85* in prep for work activities    Status Achieved   met 85     OT SHORT TERM GOAL #8   Title Pt will demonstrate grip strength of 23 lbs or greater in prep for functional use.    Status Achieved   11/16/21-30.7 lbs     OT SHORT TERM GOAL  #9   TITLE Pt will report increased ease with the following tasks: cutting food, opening containers, and squeezing toothpaste. Revised goal Pt will report increased ease with cutting food and opening continers using RUE.    Time 4    Period Weeks    Status Revised   still difficulty cutting food and opening contianers, met for squeeze toothpaste   Target Date 01/21/22      OT SHORT TERM GOAL  #10   TITLE Pt will be I with updated HEP to address right shoulder ROM    Time 4    Period Weeks    Status New    Target Date 01/21/22      OT SHORT TERM GOAL  #11   TITLE Pt will demonstrate ability to retrieve a lightweight object at 110 shoulder flexion with pain no greater than 3/10    Time 4    Period Weeks    Status New    Target Date 01/21/22      OT SHORT TERM GOAL  #12   TITLE Pt will demonstrate  55* wrist extension for increased ease with  ADLS.    Time 4    Period Weeks    Status New    Target Date 01/21/22      OT SHORT TERM GOAL  #13   TITLE Pt will verbalize understanding of activity modification, and positioning for sleep to minimize shoulder pain    Time 4    Period Weeks    Status New    Target Date 01/21/22               OT Long Term Goals - 12/24/21 0756       OT LONG TERM GOAL #1   Title I with updated HEP.    Status On-going   Pt met updated HEP prior to surgery, however pt will benefit from continued updates   Target Date 02/18/22      OT LONG TERM GOAL #2   Title Pt will demonstrate grip strength of at least 40 lbs in prep for work activities. Revised goal-Pt will demonstrate RUE grip strength of 45 lbs or greater for increased RUE functional use.    Time 8    Period Weeks    Status Revised   37.2   Target Date 02/18/22      OT LONG TERM GOAL #3   Title Pt will demonstrate wrist flexion/ extension WFLS for work activities.    Time 8    Period Weeks    Status On-going      OT LONG TERM GOAL #4   Title Pt will demonstrate forearm supination/ pronation of at least 85* in prep for work.    Status Achieved   85     OT LONG TERM GOAL #5   Title Pt will resume use of RUE at least 90% of the time for ADLS/ IADLS with pain no greater than 3/10.    Time 8    Period Weeks    Status On-going   uses 75%, with pain 5-6/10 at worst   Target Date 02/18/22      OT LONG TERM GOAL #6   Title Pt will perform simulated work activities modified independently    Period Weeks    Status Deferred   unable to perfrom due to pain and back prec.   Target Date 02/18/22      OT LONG TERM GOAL #7   Title Pt will demonstrate ability to retrieve a lightweight object at 120 shoulder flexion with pain less than or equal to 3/10.    Time 8    Period Weeks    Status New    Target Date 02/18/22      OT LONG TERM GOAL #8   Title Pt will increase RUE tip, and 3 pt pinch to 8 lbs or greater for increased functional  use of RUE.    Baseline Tip 4 lbs, 3pt 4 lbs    Time 8    Period Weeks    Status New    Target Date 02/18/22                   Plan - 12/24/21 1319     Clinical Impression Statement Pt is progressing towards goals. He reports no shoulder pain following tx today.    OT Occupational Profile and History Detailed Assessment- Review of Records and additional review of physical, cognitive, psychosocial history related to current functional performance    Occupational performance deficits (Please refer to evaluation for details): ADL's;IADL's;Work;Social Participation;Rest and Sleep;Leisure  Body Structure / Function / Physical Skills ADL;UE functional use;Endurance;Balance;Flexibility;Pain;FMC;ROM;Coordination;GMC;Decreased knowledge of precautions;Sensation;Scar mobility;Decreased knowledge of use of DME;IADL;Skin integrity;Dexterity;Strength;Mobility;Edema    Rehab Potential Good    Clinical Decision Making Limited treatment options, no task modification necessary    Comorbidities Affecting Occupational Performance: May have comorbidities impacting occupational performance    Modification or Assistance to Complete Evaluation  Min-Moderate modification of tasks or assist with assess necessary to complete eval    OT Frequency 2x / week   or 16 visits   OT Duration 8 weeks    OT Treatment/Interventions Self-care/ADL training;Ultrasound;Patient/family education;Scar mobilization;DME and/or AE instruction;Paraffin;Passive range of motion;Fluidtherapy;Cryotherapy;Functional Mobility Training;Splinting;Therapeutic activities;Manual Therapy;Moist Heat;Neuromuscular education    Plan [place], taping to right hsoulder, paraffin to right hand continue to progress HEP's    Consulted and Agree with Plan of Care Patient             Patient will benefit from skilled therapeutic intervention in order to improve the following deficits and impairments:   Body Structure / Function / Physical Skills:  ADL, UE functional use, Endurance, Balance, Flexibility, Pain, FMC, ROM, Coordination, GMC, Decreased knowledge of precautions, Sensation, Scar mobility, Decreased knowledge of use of DME, IADL, Skin integrity, Dexterity, Strength, Mobility, Edema       Visit Diagnosis: Stiffness of right wrist, not elsewhere classified  Pain in right wrist  Other lack of coordination  Muscle weakness (generalized)  Chronic right shoulder pain  Localized edema  Stiffness of right shoulder, not elsewhere classified  Other abnormalities of gait and mobility    Problem List Patient Active Problem List   Diagnosis Date Noted   Fall 07/09/2021   Arthritis, multiple joint involvement 09/24/2019   Class 1 obesity due to excess calories with serious comorbidity and body mass index (BMI) of 32.0 to 32.9 in adult 09/24/2019   Essential hypertension 09/24/2019   Mixed hyperlipidemia 09/24/2019   Restless legs syndrome 09/24/2019   Allergic rhinitis 11/25/2013   FHx: heart disease 11/25/2013    Genelle Economou, OT 12/24/2021, 1:23 PM  [facility] [address] [place], [place], [address] Phone: [phone]   Fax:  [phone]  Name: Craig Church MRN: [id number] Date of Birth: [birth year]

## 2021-12-24 NOTE — Therapy (Signed)
[facility] [address] [place], [place], [address] Phone: [phone]   Fax:  [phone]  Occupational Therapy Re-Evaluation  Patient Details  Name: Craig Church MRN: [id number] Date of Birth: [birth year] Referring Provider (OT): Dr. Maryan Puls. [name] (Dr. Rush Farmer for shoulder, Dr. [name] for wrist)   Encounter Date: 12/22/2021   OT End of Session - 12/23/21 0900     Visit Number 29    Number of Visits 45    Date for OT Re-Evaluation 02/17/22    Authorization Type workers comp additional 8 visits approved following the inital 16 visits 16+8=24, 8 additional visits approved 11/11/21    Authorization Time Period awaiting for additional visits auth 12/22/21, verbal consent received to proceed with tx of shoulder    Authorization - Visit Number 28    Authorization - Number of Visits 32    OT Start Time 1405    OT Stop Time 1520    OT Time Calculation (min) 75 min    Activity Tolerance Patient tolerated treatment well    Behavior During Therapy [facility] for tasks assessed/performed             Past Medical History:  Diagnosis Date   Hypertension    Seizure disorder ([place])     Past Surgical History:  Procedure Laterality Date   CHOLECYSTECTOMY     OPEN REDUCTION INTERNAL FIXATION (ORIF) DISTAL PHALANX Right 07/12/2021   Procedure: OPEN REDUCTION INTERNAL FIXATION (ORIF) DISTAL RADIUS;  Surgeon: Cindra Presume, MD;  Location: [facility];  Service: Plastics;  Laterality: Right;    There were no vitals filed for this visit.   Subjective Assessment - 12/23/21 0926     Subjective  Pt reports back pain following recent surgery    Pertinent History 56 yo male with onset of fall at work off a ladder was brought to [place] on 9/23 and received ORIF for R distal radial fracture on 9/26.  Mildly displaced R ulnar styloid fracture not requiring sx.  Received TLSO for spinal injuries of T12 comp fx, T11 anterior wedging, B inferior articular  process and spinous process fractures.  PMHx:  HA, colloid cyst of brain, idiopathic intracranial hypertension, LE edema, lumbar spondylosis and radiculopathy, chestpain, HLD, seizure disorder.    Limitations cleared for ROM and strengthening per MD, back brace on,    Patient Stated Goals custom splint    Currently in Pain? Yes    Pain Score 2     Pain Location Shoulder   hand   Pain Orientation Right    Pain Descriptors / Indicators Aching    Pain Type Chronic pain    Pain Onset More than a month ago    Pain Frequency Intermittent    Aggravating Factors  malpositioning, use    Pain Relieving Factors heat, repositioning    Pain Score 2    Pain Location Hand    Pain Orientation Right    Pain Descriptors / Indicators Aching    Pain Type Chronic pain    Pain Onset More than a month ago    Pain Frequency Intermittent    Aggravating Factors  use    Pain Relieving Factors haet, paraffin    Pain Score 6    Pain Location Back    Pain Orientation Lower    Pain Descriptors / Indicators Sore    Pain Type Acute pain    Pain Onset 1 to 4 weeks ago    Pain Frequency Constant  Aggravating Factors  s/p recent back surgery approx 2 weeks ago    Pain Relieving Factors repositioning               [facility] OT Assessment - 12/24/21 0001       Assessment   Medical Diagnosis R shoulder pain, impingement   hx of R distal radius fx, s/p ORIF 9/26,s/p  recent back surgery 12/03/21   Referring Provider (OT) Dr. Maryan Puls. [name]   Dr. Rush Farmer for shoulder, Dr. [name] for wrist   Onset Date/Surgical Date 12/15/21    Hand Dominance Right      Precautions   Precautions Back    Precaution Comments no lifting over 15 lbs due to back surgery      Home  Environment   Family/patient expects to be discharged to: Private residence    Lives With Spouse      Prior Function   Level of Independence Independent      ADL   ADL comments Pt reports difficulty with the following activities due to shoulder  pain: difficulty sleeping, difficulty reaching into overhead cabinets. Pt reports difficulty opeing containers due to RUE hand  weakness and tingling      Cognition   Overall Cognitive Status Within Functional Limits for tasks assessed      Coordination   9 Hole Peg Test Right;Left    Right 9 Hole Peg Test 31.06    Left 9 Hole Peg Test 32.97      AROM   Overall AROM  Deficits    Overall AROM Comments Pt demonstrates grossly 90 % composite finger flexion R hand following stretching    Right Shoulder Extension 45 Degrees    Right Shoulder Flexion 100 Degrees   prior to significant pain   Right Shoulder ABduction 85 Degrees   prior to significant pain   Right Shoulder External Rotation 55 Degrees    Right Forearm Pronation --   WFL   Right Forearm Supination 85 Degrees    Right Wrist Extension 50 Degrees    Right Wrist Flexion 55 Degrees      Hand Function   Right Hand Grip (lbs) 37.2    Right Hand Lateral Pinch 16 lbs    Right Hand 3 Point Pinch 4 lbs   Tip pinch 4   Left Hand Grip (lbs) 106    Left Hand Lateral Pinch 24 lbs    Left 3 point pinch 24 lbs   Tip 20             Treatment:Paraffin to right hand x 10 mins, for stiffness while [place] to right anterior and posterior shoulder x 10 mins 1 mhz, 1.0 w/cm 2, 20%, no adverse reactions. A/ROM Composite flexion, and IP flexion. Passive MP flexion and joint mobs then place and holds to right hand. Therapist checked progress towards existing goals and evaluated RUE shoulder. See updated POC                  OT Short Term Goals - 12/24/21 0750       OT SHORT TERM GOAL #1   Title I with splint wear, care and precautions    Status Achieved      OT SHORT TERM GOAL #2   Title I with inital HEP    Status Achieved      OT SHORT TERM GOAL #3   Title Pt will demonstrate ability to perfrom at least 90% composite finger flexion in prep for functional use.  Status Achieved   90%     OT SHORT TERM GOAL #4   Title  Pt will demonstrate wrist flexion/ extension of at least 40* in prep for functional use.    Period Weeks    Status Achieved   45*/ 40 extension     OT SHORT TERM GOAL #5   Title I with edema control.    Status Achieved      Additional Short Term Goals   Additional Short Term Goals Yes      OT SHORT TERM GOAL #6   Title Pt will demonstrate 50* wrist flexion and 45* wrist extension in prep for functional use    Status Achieved   met 55 for flex, 50 for ext     OT SHORT TERM GOAL #7   Title Pt will demonstrate forearm supination/ pronation of 85* in prep for work activities    Status Achieved   met 85     OT SHORT TERM GOAL #8   Title Pt will demonstrate grip strength of 23 lbs or greater in prep for functional use.    Status Achieved   11/16/21-30.7 lbs     OT SHORT TERM GOAL  #9   TITLE Pt will report increased ease with the following tasks: cutting food, opening containers, and squeezing toothpaste. Revised goal Pt will report increased ease with cutting food and opening continers using RUE.    Time 4    Period Weeks    Status Revised   still difficulty cutting food and opening contianers, met for squeeze toothpaste   Target Date 01/21/22      OT SHORT TERM GOAL  #10   TITLE Pt will be I with updated HEP to address right shoulder ROM    Time 4    Period Weeks    Status New    Target Date 01/21/22      OT SHORT TERM GOAL  #11   TITLE Pt will demonstrate ability to retrieve a lightweight object at 110 shoulder flexion with pain no greater than 3/10    Time 4    Period Weeks    Status New    Target Date 01/21/22      OT SHORT TERM GOAL  #12   TITLE Pt will demonstrate 55* wrist extension for increased ease with ADLS.    Time 4    Period Weeks    Status New    Target Date 01/21/22      OT SHORT TERM GOAL  #13   TITLE Pt will verbalize understanding of activity modification, and positioning for sleep to minimize shoulder pain    Time 4    Period Weeks    Status New     Target Date 01/21/22               OT Long Term Goals - 12/24/21 0756       OT LONG TERM GOAL #1   Title I with updated HEP.    Status On-going   Pt met updated HEP prior to surgery, however pt will benefit from continued updates   Target Date 02/18/22      OT LONG TERM GOAL #2   Title Pt will demonstrate grip strength of at least 40 lbs in prep for work activities. Revised goal-Pt will demonstrate RUE grip strength of 45 lbs or greater for increased RUE functional use.    Time 8    Period Weeks    Status Revised   37.2  Target Date 02/18/22      OT LONG TERM GOAL #3   Title Pt will demonstrate wrist flexion/ extension WFLS for work activities.    Time 8    Period Weeks    Status On-going      OT LONG TERM GOAL #4   Title Pt will demonstrate forearm supination/ pronation of at least 85* in prep for work.    Status Achieved   85     OT LONG TERM GOAL #5   Title Pt will resume use of RUE at least 90% of the time for ADLS/ IADLS with pain no greater than 3/10.    Time 8    Period Weeks    Status On-going   uses 75%, with pain 5-6/10 at worst   Target Date 02/18/22      OT LONG TERM GOAL #6   Title Pt will perform simulated work activities modified independently    Period Weeks    Status Deferred   unable to perfrom due to pain and back prec.   Target Date 02/18/22      OT LONG TERM GOAL #7   Title Pt will demonstrate ability to retrieve a lightweight object at 120 shoulder flexion with pain less than or equal to 3/10.    Time 8    Period Weeks    Status New    Target Date 02/18/22      OT LONG TERM GOAL #8   Title Pt will increase RUE tip, and 3 pt pinch to 8 lbs or greater for increased functional use of RUE.    Baseline Tip 4 lbs, 3pt 4 lbs    Time 8    Period Weeks    Status New    Target Date 02/18/22                   Plan - 12/23/21 0905     Clinical Impression Statement Pt returns to occupational therapy s/p recent back surgery  12/03/21. Pt has new referral for R shoulder pain. Pt continues to demonstrate R hand and wrist deficits from R distal radius fx s/p ORIF. Overall pt presents with the following deficits: R shoulder pain, decreased ROM, decreased RUE functional use, R hand pain, decreased RUE strength,  which impedes perfromance of ADLS/IADLS.( Pt was evaluated for the right shoulder pain and it is being incorporated into his existing plan of care.)    OT Occupational Profile and History Detailed Assessment- Review of Records and additional review of physical, cognitive, psychosocial history related to current functional performance    Occupational performance deficits (Please refer to evaluation for details): ADL's;IADL's;Work;Social Participation;Rest and Sleep;Leisure    Body Structure / Function / Physical Skills ADL;UE functional use;Endurance;Balance;Flexibility;Pain;FMC;ROM;Coordination;GMC;Decreased knowledge of precautions;Sensation;Scar mobility;Decreased knowledge of use of DME;IADL;Skin integrity;Dexterity;Strength;Mobility;Edema    Rehab Potential Good    Clinical Decision Making Limited treatment options, no task modification necessary    Comorbidities Affecting Occupational Performance: May have comorbidities impacting occupational performance    Modification or Assistance to Complete Evaluation  Min-Moderate modification of tasks or assist with assess necessary to complete eval    OT Frequency 2x / week   or 16 visits   OT Duration 8 weeks    OT Treatment/Interventions Self-care/ADL training;Ultrasound;Patient/family education;Scar mobilization;DME and/or AE instruction;Paraffin;Passive range of motion;Fluidtherapy;Cryotherapy;Functional Mobility Training;Splinting;Therapeutic activities;Manual Therapy;Moist Heat;Neuromuscular education    Plan [place], taping to right shoulder, paraffin to right hand continue to progress HEP's    Consulted and Agree with Plan of  Care Patient             Patient will  benefit from skilled therapeutic intervention in order to improve the following deficits and impairments:   Body Structure / Function / Physical Skills: ADL, UE functional use, Endurance, Balance, Flexibility, Pain, FMC, ROM, Coordination, GMC, Decreased knowledge of precautions, Sensation, Scar mobility, Decreased knowledge of use of DME, IADL, Skin integrity, Dexterity, Strength, Mobility, Edema       Visit Diagnosis: Chronic right shoulder pain  Stiffness of right wrist, not elsewhere classified  Localized edema  Pain in right wrist  Other lack of coordination  Muscle weakness (generalized)  Stiffness of right shoulder, not elsewhere classified    Problem List Patient Active Problem List   Diagnosis Date Noted   Fall 07/09/2021   Arthritis, multiple joint involvement 09/24/2019   Class 1 obesity due to excess calories with serious comorbidity and body mass index (BMI) of 32.0 to 32.9 in adult 09/24/2019   Essential hypertension 09/24/2019   Mixed hyperlipidemia 09/24/2019   Restless legs syndrome 09/24/2019   Allergic rhinitis 11/25/2013   FHx: heart disease 11/25/2013    Korrey Schleicher, OT 12/24/2021, 8:15 AM Theone Murdoch, OTR/L [phone] Phone: [phone] 8:15 AM 12/24/21  [facility] [address] [place], [place], [address] Phone: [phone]   Fax:  [phone]  Name: Rayshon Albaugh MRN: [id number] Date of Birth: [birth year]

## 2021-12-28 ENCOUNTER — Ambulatory Visit: Payer: Worker's Compensation | Admitting: Occupational Therapy

## 2021-12-28 ENCOUNTER — Other Ambulatory Visit: Payer: Self-pay

## 2021-12-28 DIAGNOSIS — R278 Other lack of coordination: Secondary | ICD-10-CM

## 2021-12-28 DIAGNOSIS — R600 Localized edema: Secondary | ICD-10-CM

## 2021-12-28 DIAGNOSIS — G8929 Other chronic pain: Secondary | ICD-10-CM

## 2021-12-28 DIAGNOSIS — M25611 Stiffness of right shoulder, not elsewhere classified: Secondary | ICD-10-CM

## 2021-12-28 DIAGNOSIS — M25631 Stiffness of right wrist, not elsewhere classified: Secondary | ICD-10-CM

## 2021-12-28 DIAGNOSIS — R2689 Other abnormalities of gait and mobility: Secondary | ICD-10-CM

## 2021-12-28 DIAGNOSIS — M25511 Pain in right shoulder: Secondary | ICD-10-CM | POA: Diagnosis not present

## 2021-12-28 DIAGNOSIS — M25531 Pain in right wrist: Secondary | ICD-10-CM

## 2021-12-28 DIAGNOSIS — M6281 Muscle weakness (generalized): Secondary | ICD-10-CM

## 2021-12-28 NOTE — Therapy (Signed)
[facility], [place], [address] ?Phone: [phone]   Fax:  [phone] ? ?Occupational Therapy Treatment ? ?Patient Details  ?Name: Craig Church ?MRN: [id number] ?Date of Birth: August 04, 1966 ?Referring Provider (OT): Dr. Maryan Puls. [name] (Dr. Rush Farmer for shoulder, Dr. [name] for wrist) ? ? ?Encounter Date: 12/28/2021 ? ? OT End of Session - 12/28/21 1514   ? ? Visit Number 31   ? Number of Visits 45   ? Date for OT Re-Evaluation 02/17/22   ? Authorization Type workers comp additional 8 visits approved following the inital 16 visits 16+8=24, 8 additional visits approved 11/11/21   ? Authorization Time Period 8 additional visits approved on 12/22/21   ? Authorization - Visit Number 30   corrected count  ? Authorization - Number of Visits 40   inital visits approved 32, plus 8 additional=40  ? OT Start Time 1403   ? OT Stop Time 1525   ? OT Time Calculation (min) 82 min   ? Activity Tolerance Patient tolerated treatment well   ? Behavior During Therapy [facility] for tasks assessed/performed   ? ?  ?  ? ?  ? ? ?Past Medical History:  ?Diagnosis Date  ? Hypertension   ? Seizure disorder ([place])   ? ? ?Past Surgical History:  ?Procedure Laterality Date  ? CHOLECYSTECTOMY    ? OPEN REDUCTION INTERNAL FIXATION (ORIF) DISTAL PHALANX Right 07/12/2021  ? Procedure: OPEN REDUCTION INTERNAL FIXATION (ORIF) DISTAL RADIUS;  Surgeon: Cindra Presume, MD;  Location: [facility];  Service: Plastics;  Laterality: Right;  ? ? ?There were no vitals filed for this visit. ? ? Subjective Assessment - 12/28/21 1432   ? ? Subjective  Pt reports tape aggravated shoulder   ? Pertinent History 56 yo male with onset of fall at work off a ladder was brought to [facility] on 9/23 and received ORIF for R distal radial fracture on 9/26.  Mildly displaced R ulnar styloid fracture not requiring sx.  Received TLSO for spinal injuries of T12 comp fx, T11 anterior wedging, B inferior articular process  and spinous process fractures.  PMHx:  HA, colloid cyst of brain, idiopathic intracranial hypertension, LE edema, lumbar spondylosis and radiculopathy, chestpain, HLD, seizure disorder.   ? Limitations Back precuations from recent back surgery 12/03/21, no lifting over 15 lbs, continues tx for R shoulder and hand pain   ? Patient Stated Goals custom splint   ? Currently in Pain? Yes   ? Pain Score 2    ? Pain Location Shoulder   hand  ? Pain Orientation Right   ? Pain Descriptors / Indicators Aching   ? Pain Type Chronic pain   ? Pain Onset More than a month ago   ? Pain Frequency Intermittent   ? Aggravating Factors  malpositioning, hand swelling   ? Pain Relieving Factors heat, [place], paraffin   ? Pain Score 6   ? Pain Location Back   ? Pain Orientation Right   ? Pain Descriptors / Indicators Aching   ? Pain Type Acute pain   ? Pain Onset 1 to 4 weeks ago   ? Pain Frequency Constant   ? Aggravating Factors  recent surgery   ? Pain Relieving Factors rest   ? ?  ?  ? ?  ? ? ? ? ? ? ? ? ? ? ? ? ? ? ? ?Treatment: Paraffin to right hand x 10 mins, for stiffness while [place] to right anterior  and posterior shoulder x 10 mins 3 mhz, 0.8 w/cm 2, 20%, no adverse reactions. ?A/ROM Composite flexion, and IP flexion. Passive MP flexion and joint mobs and massage to plam,  then place and holds to right hand, self massage to palm with massage ball  ?A/ROM wrist flexion/ extension, then with 2 lbs weight,  ?UE ranger for midrange shoulder flexion and circumduction, with therapist facilitating posture and scapular activation/ positioning ?Wall slides for RUE, with therapist facilitating scapular depression and activation ?External rotation in seated with RUE by side, pt is pain free, min v.c for posture. ?Kinesiotape applied to dorsal hand for edema control, pt verbalizes understanding of precautions ?Pen rolling exercise, then rolling ball in fingertips, min v.c and facilitation ?Grooved pegs for increased fine motor coordination with R  hand.  ?Pt reports hand feels better with kinesiotape. ?Education regarding shoulder positioning to minimize pain. ? ? ? ? ? ? ? ? OT Education - 12/28/21 1516   ? ? Education Details Kinesiotape wear, care and precautions,   ? Person(s) Educated Patient   ? Methods Explanation;Demonstration;Verbal cues;Handout   ? Comprehension Verbalized understanding;Returned demonstration   ? ?  ?  ? ?  ? ? ? OT Short Term Goals - 12/24/21 0750   ? ?  ? OT SHORT TERM GOAL #1  ? Title I with splint wear, care and precautions   ? Status Achieved   ?  ? OT SHORT TERM GOAL #2  ? Title I with inital HEP   ? Status Achieved   ?  ? OT SHORT TERM GOAL #3  ? Title Pt will demonstrate ability to perfrom at least 90% composite finger flexion in prep for functional use.   ? Status Achieved   90%  ?  ? OT SHORT TERM GOAL #4  ? Title Pt will demonstrate wrist flexion/ extension of at least 40* in prep for functional use.   ? Period Weeks   ? Status Achieved   45*/ 40 extension  ?  ? OT SHORT TERM GOAL #5  ? Title I with edema control.   ? Status Achieved   ?  ? Additional Short Term Goals  ? Additional Short Term Goals Yes   ?  ? OT SHORT TERM GOAL #6  ? Title Pt will demonstrate 50* wrist flexion and 45* wrist extension in prep for functional use   ? Status Achieved   met 55 for flex, 50 for ext  ?  ? OT SHORT TERM GOAL #7  ? Title Pt will demonstrate forearm supination/ pronation of 85* in prep for work activities   ? Status Achieved   met 85  ?  ? OT SHORT TERM GOAL #8  ? Title Pt will demonstrate grip strength of 23 lbs or greater in prep for functional use.   ? Status Achieved   11/16/21-30.7 lbs  ?  ? OT SHORT TERM GOAL  #9  ? TITLE Pt will report increased ease with the following tasks: cutting food, opening containers, and squeezing toothpaste. Revised goal Pt will report increased ease with cutting food and opening continers using RUE.   ? Time 4   ? Period Weeks   ? Status Revised   still difficulty cutting food and opening  contianers, met for squeeze toothpaste  ? Target Date 01/21/22   ?  ? OT SHORT TERM GOAL  #10  ? TITLE Pt will be I with updated HEP to address right shoulder ROM   ? Time 4   ?  Period Weeks   ? Status New   ? Target Date 01/21/22   ?  ? OT SHORT TERM GOAL  #11  ? TITLE Pt will demonstrate ability to retrieve a lightweight object at 110 shoulder flexion with pain no greater than 3/10   ? Time 4   ? Period Weeks   ? Status New   ? Target Date 01/21/22   ?  ? OT SHORT TERM GOAL  #12  ? TITLE Pt will demonstrate 55* wrist extension for increased ease with ADLS.   ? Time 4   ? Period Weeks   ? Status New   ? Target Date 01/21/22   ?  ? OT SHORT TERM GOAL  #13  ? TITLE Pt will verbalize understanding of activity modification, and positioning for sleep to minimize shoulder pain   ? Time 4   ? Period Weeks   ? Status New   ? Target Date 01/21/22   ? ?  ?  ? ?  ? ? ? ? OT Long Term Goals - 12/24/21 0756   ? ?  ? OT LONG TERM GOAL #1  ? Title I with updated HEP.   ? Status On-going   Pt met updated HEP prior to surgery, however pt will benefit from continued updates  ? Target Date 02/18/22   ?  ? OT LONG TERM GOAL #2  ? Title Pt will demonstrate grip strength of at least 40 lbs in prep for work activities. Revised goal-Pt will demonstrate RUE grip strength of 45 lbs or greater for increased RUE functional use.   ? Time 8   ? Period Weeks   ? Status Revised   37.2  ? Target Date 02/18/22   ?  ? OT LONG TERM GOAL #3  ? Title Pt will demonstrate wrist flexion/ extension WFLS for work activities.   ? Time 8   ? Period Weeks   ? Status On-going   ?  ? OT LONG TERM GOAL #4  ? Title Pt will demonstrate forearm supination/ pronation of at least 85* in prep for work.   ? Status Achieved   85  ?  ? OT LONG TERM GOAL #5  ? Title Pt will resume use of RUE at least 90% of the time for ADLS/ IADLS with pain no greater than 3/10.   ? Time 8   ? Period Weeks   ? Status On-going   uses 75%, with pain 5-6/10 at worst  ? Target Date 02/18/22    ?  ? OT LONG TERM GOAL #6  ? Title Pt will perform simulated work activities modified independently   ? Period Weeks   ? Status Deferred   unable to perfrom due to pain and back prec.  ? Target Date 05/05/2

## 2021-12-30 ENCOUNTER — Encounter: Payer: Self-pay | Admitting: Occupational Therapy

## 2021-12-30 ENCOUNTER — Ambulatory Visit: Payer: Worker's Compensation | Admitting: Occupational Therapy

## 2021-12-30 ENCOUNTER — Other Ambulatory Visit: Payer: Self-pay

## 2021-12-30 DIAGNOSIS — M25611 Stiffness of right shoulder, not elsewhere classified: Secondary | ICD-10-CM

## 2021-12-30 DIAGNOSIS — M25631 Stiffness of right wrist, not elsewhere classified: Secondary | ICD-10-CM

## 2021-12-30 DIAGNOSIS — R278 Other lack of coordination: Secondary | ICD-10-CM

## 2021-12-30 DIAGNOSIS — M6281 Muscle weakness (generalized): Secondary | ICD-10-CM

## 2021-12-30 DIAGNOSIS — M25511 Pain in right shoulder: Secondary | ICD-10-CM | POA: Diagnosis not present

## 2021-12-30 DIAGNOSIS — G8929 Other chronic pain: Secondary | ICD-10-CM

## 2021-12-30 DIAGNOSIS — R600 Localized edema: Secondary | ICD-10-CM

## 2021-12-30 DIAGNOSIS — M25531 Pain in right wrist: Secondary | ICD-10-CM

## 2021-12-30 NOTE — Therapy (Signed)
[facility] ?[place], [place], [address] ?Phone: [phone]   Fax:  [phone] ? ?Occupational Therapy Treatment ? ?Patient Details  ?Name: Craig Church ?MRN: [id number] ?Date of Birth: [birth year] ?Referring Provider (OT): Dr. Maryan Puls. [name] (Dr. Rush Farmer for shoulder, Dr. [name] for wrist) ? ? ?Encounter Date: 12/30/2021 ? ? OT End of Session - 12/30/21 1700   ? ? Visit Number 32   ? Number of Visits 45   ? Date for OT Re-Evaluation 02/17/22   ? Authorization Type workers comp additional 8 visits approved following the inital 16 visits 16+8=24, 8 additional visits approved 11/11/21   ? Authorization Time Period 8 additional visits approved on 12/22/21   ? Authorization - Visit Number 31   ? Authorization - Number of Visits 40   ? OT Start Time 6010   ? OT Stop Time 9323   ? OT Time Calculation (min) 88 min   ? Equipment Utilized During Adult nurse, [place], Paraffin   ? Activity Tolerance Patient tolerated treatment well   ? Behavior During Therapy [facility] for tasks assessed/performed   ? ?  ?  ? ?  ? ? ?Past Medical History:  ?Diagnosis Date  ? Hypertension   ? Seizure disorder ([place])   ? ? ?Past Surgical History:  ?Procedure Laterality Date  ? CHOLECYSTECTOMY    ? OPEN REDUCTION INTERNAL FIXATION (ORIF) DISTAL PHALANX Right 07/12/2021  ? Procedure: OPEN REDUCTION INTERNAL FIXATION (ORIF) DISTAL RADIUS;  Surgeon: Cindra Presume, MD;  Location: [facility];  Service: Plastics;  Laterality: Right;  ? ? ?There were no vitals filed for this visit. ? ? Subjective Assessment - 12/30/21 1454   ? ? Subjective  Just waiting on the MRI (For lower back)   ? Pertinent History 56 yo male with onset of fall at work off a ladder was brought to [facility] on 9/23 and received ORIF for R distal radial fracture on 9/26.  Mildly displaced R ulnar styloid fracture not requiring sx.  Received TLSO for spinal injuries of T12 comp fx, T11 anterior wedging, B inferior articular  process and spinous process fractures.  PMHx:  HA, colloid cyst of brain, idiopathic intracranial hypertension, LE edema, lumbar spondylosis and radiculopathy, chestpain, HLD, seizure disorder.   ? Limitations Back precuations from recent back surgery 12/03/21, no lifting over 15 lbs, continues tx for R shoulder and hand pain   ? Currently in Pain? Yes   ? Pain Score 2    ? Pain Location Wrist   ? Pain Orientation Right   ? Pain Descriptors / Indicators Aching   ? Pain Type Chronic pain   ? Pain Onset More than a month ago   ? Pain Frequency Intermittent   ? Aggravating Factors  swelling   ? Pain Relieving Factors heat, [place], Paraffin   ? Multiple Pain Sites Yes   ? Pain Score 2   ? Pain Location Shoulder   ? Pain Orientation Right   ? Pain Descriptors / Indicators Aching   ? Pain Type Acute pain   ? Pain Onset 1 to 4 weeks ago   ? Pain Frequency Intermittent   ? Aggravating Factors  movement   ? Pain Relieving Factors alignment   ? ?  ?  ? ?  ? ? ? ? ? ? ? ? ? ? ? ? ? ? ? OT Treatments/Exercises (OP) - 12/30/21 0001   ? ?  ? Hand Exercises  ? Other Hand  Exercises Passive and active range of motion right hand with emphasis on MCP/PIP joints for flexion and extension following paraffin.  Patient with overall less pain   ?  ? Neurological Re-education Exercises  ? Other Exercises 1 Patient with tight joint capsule right shoulder - did well with active relaxation to allow [place] joint motion first passive then active.   ?  ? Ultrasound  ? Ultrasound Location anterior / posterior shoulder   ? Ultrasound Parameters 66mz, 20%, 0.8 w/cm2, x 12 min (6 posterior, 6 min anterior/ superior/lateral)   ? Ultrasound Goals Pain;Edema   ?  ? RUE Paraffin  ? Number Minutes Paraffin 12 Minutes   ? RUE Paraffin Location Hand   ? Comments wrapped in composite flexion with coban.  Heated hand while [place] shoulder   ?  ? Manual Therapy  ? Manual Therapy Scapular mobilization;Joint mobilization;Taping   ? Joint Mobilization Worked on humeral  movement stabilizing proximal shoulder.  Worked in supported supine (on wedge) initially - then upright to use UE ranger on wall at low position.  Working on flexion and scaption with emphasis on body position and scapular depression.   ? Scapular Mobilization Worked on isolating scapular motion - elevation and depression.  Patient had difficulty grading scapular depression, and had strong anterior pull of shoulder musculature.  Needed mod facilitation to maintain alignment of scapula on thorax during motion.  Worked on gentle glenohumeral joint motion with scapular alignment.   ? Kinesiotex Edema   right hand dorsum  ? ?  ?  ? ?  ? ? ? ? ? ? ? ? ? OT Education - 12/30/21 1700   ? ? Education Details Kinesiotape wear, care and precautions,   ? Person(s) Educated Patient   ? Methods Explanation;Demonstration   ? Comprehension Verbalized understanding;Returned demonstration   ? ?  ?  ? ?  ? ? ? OT Short Term Goals - 12/30/21 1706   ? ?  ? OT SHORT TERM GOAL #1  ? Title I with splint wear, care and precautions   ? Status Achieved   ?  ? OT SHORT TERM GOAL #2  ? Title I with inital HEP   ? Status Achieved   ?  ? OT SHORT TERM GOAL #3  ? Title Pt will demonstrate ability to perfrom at least 90% composite finger flexion in prep for functional use.   ? Status Achieved   90%  ?  ? OT SHORT TERM GOAL #4  ? Title Pt will demonstrate wrist flexion/ extension of at least 40* in prep for functional use.   ? Period Weeks   ? Status Achieved   45*/ 40 extension  ?  ? OT SHORT TERM GOAL #5  ? Title I with edema control.   ? Status Achieved   ?  ? OT SHORT TERM GOAL #6  ? Title Pt will demonstrate 50* wrist flexion and 45* wrist extension in prep for functional use   ? Status Achieved   met 55 for flex, 50 for ext  ?  ? OT SHORT TERM GOAL #7  ? Title Pt will demonstrate forearm supination/ pronation of 85* in prep for work activities   ? Status Achieved   met 85  ?  ? OT SHORT TERM GOAL #8  ? Title Pt will demonstrate grip strength  of 23 lbs or greater in prep for functional use.   ? Status Achieved   11/16/21-30.7 lbs  ?  ? OT SHORT  TERM GOAL  #9  ? TITLE Pt will report increased ease with the following tasks: cutting food, opening containers, and squeezing toothpaste. Revised goal Pt will report increased ease with cutting food and opening continers using RUE.   ? Time 4   ? Period Weeks   ? Status Revised   still difficulty cutting food and opening contianers, met for squeeze toothpaste  ? Target Date 01/21/22   ?  ? OT SHORT TERM GOAL  #10  ? TITLE Pt will be I with updated HEP to address right shoulder ROM   ? Time 4   ? Period Weeks   ? Status On-going   ? Target Date 01/21/22   ?  ? OT SHORT TERM GOAL  #11  ? TITLE Pt will demonstrate ability to retrieve a lightweight object at 110 shoulder flexion with pain no greater than 3/10   ? Time 4   ? Period Weeks   ? Status On-going   ? Target Date 01/21/22   ?  ? OT SHORT TERM GOAL  #12  ? TITLE Pt will demonstrate 55* wrist extension for increased ease with ADLS.   ? Time 4   ? Period Weeks   ? Status On-going   ? Target Date 01/21/22   ?  ? OT SHORT TERM GOAL  #13  ? TITLE Pt will verbalize understanding of activity modification, and positioning for sleep to minimize shoulder pain   ? Time 4   ? Period Weeks   ? Status On-going   ? Target Date 01/21/22   ? ?  ?  ? ?  ? ? ? ? OT Long Term Goals - 12/30/21 1707   ? ?  ? OT LONG TERM GOAL #1  ? Title I with updated HEP.   ? Status On-going   Pt met updated HEP prior to surgery, however pt will benefit from continued updates  ? Target Date 02/18/22   ?  ? OT LONG TERM GOAL #2  ? Title Pt will demonstrate grip strength of at least 40 lbs in prep for work activities. Revised goal-Pt will demonstrate RUE grip strength of 45 lbs or greater for increased RUE functional use.   ? Time 8   ? Period Weeks   ? Status Revised   37.2  ? Target Date 02/18/22   ?  ? OT LONG TERM GOAL #3  ? Title Pt will demonstrate wrist flexion/ extension WFLS for work  activities.   ? Time 8   ? Period Weeks   ? Status On-going   ?  ? OT LONG TERM GOAL #4  ? Title Pt will demonstrate forearm supination/ pronation of at least 85* in prep for work.   ? Status Achieved   85  ?  ?

## 2022-01-04 ENCOUNTER — Ambulatory Visit: Payer: Worker's Compensation | Attending: Family Medicine | Admitting: Occupational Therapy

## 2022-01-04 ENCOUNTER — Other Ambulatory Visit: Payer: Self-pay

## 2022-01-04 DIAGNOSIS — G8929 Other chronic pain: Secondary | ICD-10-CM | POA: Insufficient documentation

## 2022-01-04 DIAGNOSIS — M25631 Stiffness of right wrist, not elsewhere classified: Secondary | ICD-10-CM | POA: Diagnosis present

## 2022-01-04 DIAGNOSIS — M25511 Pain in right shoulder: Secondary | ICD-10-CM | POA: Diagnosis present

## 2022-01-04 DIAGNOSIS — M6281 Muscle weakness (generalized): Secondary | ICD-10-CM | POA: Insufficient documentation

## 2022-01-04 DIAGNOSIS — M25611 Stiffness of right shoulder, not elsewhere classified: Secondary | ICD-10-CM | POA: Insufficient documentation

## 2022-01-04 DIAGNOSIS — R278 Other lack of coordination: Secondary | ICD-10-CM | POA: Insufficient documentation

## 2022-01-04 DIAGNOSIS — R600 Localized edema: Secondary | ICD-10-CM | POA: Diagnosis present

## 2022-01-04 DIAGNOSIS — M25531 Pain in right wrist: Secondary | ICD-10-CM | POA: Insufficient documentation

## 2022-01-04 NOTE — Therapy (Signed)
[facility] ?[place], [place], [address] ?Phone: [phone]   Fax:  [phone] ? ?Occupational Therapy Treatment ? ?Patient Details  ?Name: Craig Church ?MRN: [id number] ?Date of Birth: [birth year] ?Referring Provider (OT): Dr. Maryan Puls. [name] (Dr. Rush Farmer for shoulder, Dr. [name] for wrist) ? ? ?Encounter Date: 01/04/2022 ? ? ? ?Past Medical History:  ?Diagnosis Date  ? Hypertension   ? Seizure disorder ([place])   ? ? ?Past Surgical History:  ?Procedure Laterality Date  ? CHOLECYSTECTOMY    ? OPEN REDUCTION INTERNAL FIXATION (ORIF) DISTAL PHALANX Right 07/12/2021  ? Procedure: OPEN REDUCTION INTERNAL FIXATION (ORIF) DISTAL RADIUS;  Surgeon: Cindra Presume, MD;  Location: [facility];  Service: Plastics;  Laterality: Right;  ? ? ?There were no vitals filed for this visit. ? ? Subjective Assessment - 01/04/22 1525   ? ? Subjective  Pt reports he sees shoulder MD next week   ? Pertinent History 56 yo male with onset of fall at work off a ladder was brought to [facility] on 9/23 and received ORIF for R distal radial fracture on 9/26.  Mildly displaced R ulnar styloid fracture not requiring sx.  Received TLSO for spinal injuries of T12 comp fx, T11 anterior wedging, B inferior articular process and spinous process fractures.  PMHx:  HA, colloid cyst of brain, idiopathic intracranial hypertension, LE edema, lumbar spondylosis and radiculopathy, chestpain, HLD, seizure disorder.   ? Limitations Back precuations from recent back surgery 12/03/21, no lifting over 15 lbs, continues tx for R shoulder and hand pain   ? Patient Stated Goals custom splint   ? Currently in Pain? Yes   ? Pain Score 3    ? Pain Location Shoulder   hand  ? Pain Orientation Right   ? Pain Descriptors / Indicators Aching   ? Pain Type Chronic pain   ? Pain Onset More than a month ago   ? Pain Frequency Intermittent   ? Aggravating Factors  swelling, malpositioning   ? Pain Relieving Factors heat,  [place], Paraffin   ? ?  ?  ? ?  ? ? ? ? ? ? ? ?Treatment: Paraffin to right hand x 10 mins, for stiffness while [place] to right posterior shoulder x 8 mins 1 mhz, 0.8 w/cm 2, 20%, no adverse reactions. ?A/ROM Composite flexion, and IP flexion. Passive MP flexion and joint mobs, composite finger flexion  ?A/ROM wrist flexion/ extension and supination/ pronation  with 2 lbs weight, Copying small peg design for increased fine motor coordination with R hand.  ? ?Supine on wedge joint mobs and gentle distraction to right shoulder, followed by AA/ROM shoulder flexion and shoulder circles overhead with therapist facilitating shoulder positioning, AA/ROM internal/ external rotation ?UE ranger for midrange shoulder flexion, circumduction, internal/ external rotation with therapist facilitating posture and scapular activation/ positioning ?Arm bike x 5 mins level 1 for reciprocal movements ?Functional reaching with RUE, min v.c for positioning to remove graded clothespins 1-8# for functional reach and sustained pinch ? ? ? ? ? ? ? ? ? ? ? ? ? ? ? ? ? ? OT Short Term Goals - 12/30/21 1706   ? ?  ? OT SHORT TERM GOAL #1  ? Title I with splint wear, care and precautions   ? Status Achieved   ?  ? OT SHORT TERM GOAL #2  ? Title I with inital HEP   ? Status Achieved   ?  ? OT SHORT TERM GOAL #3  ?  Title Pt will demonstrate ability to perfrom at least 90% composite finger flexion in prep for functional use.   ? Status Achieved   90%  ?  ? OT SHORT TERM GOAL #4  ? Title Pt will demonstrate wrist flexion/ extension of at least 40* in prep for functional use.   ? Period Weeks   ? Status Achieved   45*/ 40 extension  ?  ? OT SHORT TERM GOAL #5  ? Title I with edema control.   ? Status Achieved   ?  ? OT SHORT TERM GOAL #6  ? Title Pt will demonstrate 50* wrist flexion and 45* wrist extension in prep for functional use   ? Status Achieved   met 55 for flex, 50 for ext  ?  ? OT SHORT TERM GOAL #7  ? Title Pt will demonstrate forearm supination/  pronation of 85* in prep for work activities   ? Status Achieved   met 85  ?  ? OT SHORT TERM GOAL #8  ? Title Pt will demonstrate grip strength of 23 lbs or greater in prep for functional use.   ? Status Achieved   11/16/21-30.7 lbs  ?  ? OT SHORT TERM GOAL  #9  ? TITLE Pt will report increased ease with the following tasks: cutting food, opening containers, and squeezing toothpaste. Revised goal Pt will report increased ease with cutting food and opening continers using RUE.   ? Time 4   ? Period Weeks   ? Status Revised   still difficulty cutting food and opening contianers, met for squeeze toothpaste  ? Target Date 01/21/22   ?  ? OT SHORT TERM GOAL  #10  ? TITLE Pt will be I with updated HEP to address right shoulder ROM   ? Time 4   ? Period Weeks   ? Status On-going   ? Target Date 01/21/22   ?  ? OT SHORT TERM GOAL  #11  ? TITLE Pt will demonstrate ability to retrieve a lightweight object at 110 shoulder flexion with pain no greater than 3/10   ? Time 4   ? Period Weeks   ? Status On-going   ? Target Date 01/21/22   ?  ? OT SHORT TERM GOAL  #12  ? TITLE Pt will demonstrate 55* wrist extension for increased ease with ADLS.   ? Time 4   ? Period Weeks   ? Status On-going   ? Target Date 01/21/22   ?  ? OT SHORT TERM GOAL  #13  ? TITLE Pt will verbalize understanding of activity modification, and positioning for sleep to minimize shoulder pain   ? Time 4   ? Period Weeks   ? Status On-going   ? Target Date 01/21/22   ? ?  ?  ? ?  ? ? ? ? OT Long Term Goals - 12/30/21 1707   ? ?  ? OT LONG TERM GOAL #1  ? Title I with updated HEP.   ? Status On-going   Pt met updated HEP prior to surgery, however pt will benefit from continued updates  ? Target Date 02/18/22   ?  ? OT LONG TERM GOAL #2  ? Title Pt will demonstrate grip strength of at least 40 lbs in prep for work activities. Revised goal-Pt will demonstrate RUE grip strength of 45 lbs or greater for increased RUE functional use.   ? Time 8   ? Period Weeks   ?  Status Revised   37.2  ? Target Date 02/18/22   ?  ? OT LONG TERM GOAL #3  ? Title Pt will demonstrate wrist flexion/ extension WFLS for work activities.   ? Time 8   ? Period Weeks   ? Status On-going   ?  ? OT LONG TERM GOAL #4  ? Title Pt will demonstrate forearm supination/ pronation of at least 85* in prep for work.   ? Status Achieved   85  ?  ? OT LONG TERM GOAL #5  ? Title Pt will resume use of RUE at least 90% of the time for ADLS/ IADLS with pain no greater than 3/10.   ? Time 8   ? Period Weeks   ? Status On-going   uses 75%, with pain 5-6/10 at worst  ? Target Date 02/18/22   ?  ? OT LONG TERM GOAL #6  ? Title Pt will perform simulated work activities modified independently   ? Period Weeks   ? Status Deferred   unable to perfrom due to pain and back prec.  ? Target Date 02/18/22   ?  ? OT LONG TERM GOAL #7  ? Title Pt will demonstrate ability to retrieve a lightweight object at 120 shoulder flexion with pain less than or equal to 3/10.   ? Time 8   ? Period Weeks   ? Status On-going   ? Target Date 02/18/22   ?  ? OT LONG TERM GOAL #8  ? Title Pt will increase RUE tip, and 3 pt pinch to 8 lbs or greater for increased functional use of RUE.   ? Baseline Tip 4 lbs, 3pt 4 lbs   ? Time 8   ? Period Weeks   ? Status On-going   ? Target Date 02/18/22   ? ?  ?  ? ?  ? ? ? ? ? ? ? ? ? ?Patient will benefit from skilled therapeutic intervention in order to improve the following deficits and impairments:   ?  ?  ?  ? ? ?Visit Diagnosis: ?Stiffness of right wrist, not elsewhere classified ? ?Pain in right wrist ? ?Other lack of coordination ? ?Muscle weakness (generalized) ? ?Chronic right shoulder pain ? ?Localized edema ? ?Stiffness of right shoulder, not elsewhere classified ? ? ? ?Problem List ?Patient Active Problem List  ? Diagnosis Date Noted  ? Fall 07/09/2021  ? Arthritis, multiple joint involvement 09/24/2019  ? Class 1 obesity due to excess calories with serious comorbidity and body mass index (BMI) of  32.0 to 32.9 in adult 09/24/2019  ? Essential hypertension 09/24/2019  ? Mixed hyperlipidemia 09/24/2019  ? Restless legs syndrome 09/24/2019  ? Allergic rhinitis 11/25/2013  ? FHx: heart disease 02/09

## 2022-01-06 ENCOUNTER — Ambulatory Visit: Payer: Worker's Compensation | Admitting: Occupational Therapy

## 2022-01-06 ENCOUNTER — Other Ambulatory Visit: Payer: Self-pay

## 2022-01-06 DIAGNOSIS — R600 Localized edema: Secondary | ICD-10-CM

## 2022-01-06 DIAGNOSIS — M25611 Stiffness of right shoulder, not elsewhere classified: Secondary | ICD-10-CM

## 2022-01-06 DIAGNOSIS — G8929 Other chronic pain: Secondary | ICD-10-CM

## 2022-01-06 DIAGNOSIS — M25631 Stiffness of right wrist, not elsewhere classified: Secondary | ICD-10-CM | POA: Diagnosis not present

## 2022-01-06 DIAGNOSIS — R278 Other lack of coordination: Secondary | ICD-10-CM

## 2022-01-06 DIAGNOSIS — M25531 Pain in right wrist: Secondary | ICD-10-CM

## 2022-01-06 DIAGNOSIS — M6281 Muscle weakness (generalized): Secondary | ICD-10-CM

## 2022-01-06 NOTE — Patient Instructions (Addendum)
? ? ?  Lay on your back in recliner-Active Assistive Shoulder External Rotation ? ? ? ?Right palm up and left palm down, push forearm out from body with  left  down, and keep elbows bent. Hold. Perform 10 reps. 2X DAY Stop if increased pain ? ? ?

## 2022-01-07 NOTE — Therapy (Addendum)
[facility] ?[place] ?[place], [place], [address] ?Phone: [phone]   Fax:  [phone] ? ?Occupational Therapy Treatment ? ?Patient Details  ?Name: Craig Church ?MRN: [id number] ?Date of Birth: 1966/08/14 ?Referring Provider (OT): Dr. Jerl Lagos. [name] (Dr. Ninfa Linden for shoulder, Dr. [name] for wrist) ? ? ?Encounter Date: 01/06/2022 ? ? OT End of Session - 01/07/22 0744   ? ? Visit Number 34   ? Number of Visits 45   ? Date for OT Re-Evaluation 02/17/22   ? Authorization Type workers comp additional 8 visits approved following the inital 16 visits 16+8=24, 8 additional visits approved 11/11/21   ? Authorization Time Period 8 additional visits approved on 12/22/21   ? Authorization - Visit Number 33   ? Authorization - Number of Visits 40   ? OT Start Time 1450   ? OT Stop Time 7782   ? OT Time Calculation (min) 77 min   ? ?  ?  ? ?  ? ? ?Past Medical History:  ?Diagnosis Date  ? Hypertension   ? Seizure disorder ([place])   ? ? ?Past Surgical History:  ?Procedure Laterality Date  ? CHOLECYSTECTOMY    ? OPEN REDUCTION INTERNAL FIXATION (ORIF) DISTAL PHALANX Right 07/12/2021  ? Procedure: OPEN REDUCTION INTERNAL FIXATION (ORIF) DISTAL RADIUS;  Surgeon: Cindra Presume, MD;  Location: [facility];  Service: Plastics;  Laterality: Right;  ? ? ?There were no vitals filed for this visit. ? ? Subjective Assessment - 01/07/22 0741   ? ? Subjective  Pt reports increased shoulder pain today, pt reports that his PT has had him doing resistive bands yesterday   ? Pertinent History 56 yo male with onset of fall at work off a ladder was brought to [facility] on 9/23 and received ORIF for R distal radial fracture on 9/26.  Mildly displaced R ulnar styloid fracture not requiring sx.  Received TLSO for spinal injuries of T12 comp fx, T11 anterior wedging, B inferior articular process and spinous process fractures.  PMHx:  HA, colloid cyst of brain, idiopathic intracranial hypertension, LE  edema, lumbar spondylosis and radiculopathy, chestpain, HLD, seizure disorder.   ? Limitations Back precuations from recent back surgery 12/03/21, no lifting over 15 lbs, continues tx for R shoulder and hand pain   ? Patient Stated Goals custom splint   ? Currently in Pain? Yes   ? Pain Score 6    ? Pain Location Shoulder   ? Pain Orientation Right   ? Pain Descriptors / Indicators Aching   ? Pain Type Acute pain   ? Pain Onset Yesterday   ? Pain Frequency Intermittent   ? Aggravating Factors  malpositioning, overuse   ? Pain Relieving Factors [place]   ? Multiple Pain Sites Yes   ? Pain Score 3   ? Pain Location Hand   ? Pain Orientation Right   ? Pain Descriptors / Indicators Aching   ? Pain Type Chronic pain   ? Pain Onset 1 to 4 weeks ago   ? Pain Frequency Intermittent   ? Aggravating Factors  movment, swelling   ? Pain Relieving Factors rest   ? ?  ?  ? ?  ? ? ? ? ? ? ? ? ? ? ? ? ?Treatment: Paraffin to right hand x 10 mins, for stiffness while [place] to right shoulder x 8 mins 1 mhz, 0.8 w/cm 2, continuous, no adverse reactions. ? ?Pt reports increased shoulder pain today. He was pain  free after leaving OT on Tuesday. Pt reports his Back PT(at a separate location) is having him perform resistive theraband.  ?Pt was encouraged to discuss with his PT and to stop using resistance until shoulder pain and positioning is improved ?Supine on wedge joint mobs and gentle distraction to right shoulder, AA/ROM / P/ROM shoulder and scapular retraction followed by AA/ROM  internal and external rotation with cane  ?UE ranger for midrange AA/ROM shoulder flexion, circumduction, internal/ external rotation with therapist facilitating posture and scapular activation/ positioning ?Therapist issued AA/ROM internal/ external rotation with cane for HEP and pt continues with seated chest press for scapular and shoulder retraction ?Discussion with pt regarding proper shoulder postioning to minimize pain. Pt maintains shoulders rounded at  rest, he is encouraged  to maintain shoulder and scapular retraction as able,  ? and to avoid keeping arm in internally rotated position. ? ? ?A/ROM Composite flexion, and IP flexion. Passive composite finger flexion  ?Gripper set at level 2 for sustained grip to pinch up 1 inch blocks, min difficulty ?Digi flex yellow for individual and composite finger flexion. ?Kinesiotape applied to dorsal fingers, hand and wrist to decrease edema and pain. ? ? ? ? ? ? ? ? ? ? ? OT Short Term Goals - 01/07/22 0749   ? ?  ? OT SHORT TERM GOAL  #9  ? TITLE Pt will report increased ease with the following tasks: cutting food, opening containers, and squeezing toothpaste. Revised goal Pt will report increased ease with cutting food and opening continers using RUE.   ? Time 4   ? Period Weeks   ? Status Revised   still difficulty cutting food and opening contianers, met for squeeze toothpaste  ? Target Date 01/21/22   ?  ? OT SHORT TERM GOAL  #10  ? TITLE Pt will be I with updated HEP to address right shoulder ROM   ? Time 4   ? Period Weeks   ? Status On-going   ? Target Date 01/21/22   ?  ? OT SHORT TERM GOAL  #11  ? TITLE Pt will demonstrate ability to retrieve a lightweight object at 110 shoulder flexion with pain no greater than 3/10   ? Time 4   ? Period Weeks   ? Status On-going   ? Target Date 01/21/22   ?  ? OT SHORT TERM GOAL  #12  ? TITLE Pt will demonstrate 55* wrist extension for increased ease with ADLS.   ? Time 4   ? Period Weeks   ? Status On-going   ? Target Date 01/21/22   ?  ? OT SHORT TERM GOAL  #13  ? TITLE Pt will verbalize understanding of activity modification, and positioning for sleep to minimize shoulder pain   ? Time 4   ? Period Weeks   ? Status On-going   ? Target Date 01/21/22   ? ?  ?  ? ?  ? ? ? ? OT Long Term Goals - 01/07/22 0750   ? ?  ? OT LONG TERM GOAL #1  ? Title I with updated HEP.   ? Status On-going   Pt met updated HEP prior to surgery, however pt will benefit from continued updates  ?  Target Date 02/18/22   ?  ? OT LONG TERM GOAL #2  ? Title Pt will demonstrate grip strength of at least 40 lbs in prep for work activities. Revised goal-Pt will demonstrate RUE grip strength of 45 lbs  or greater for increased RUE functional use.   ? Time 8   ? Period Weeks   ? Status Revised   37.2  ? Target Date 02/18/22   ?  ? OT LONG TERM GOAL #3  ? Title Pt will demonstrate wrist flexion/ extension WFLS for work activities.   ? Time 8   ? Period Weeks   ? Status On-going   ?  ? OT LONG TERM GOAL #4  ? Title Pt will demonstrate forearm supination/ pronation of at least 85* in prep for work.   ? Status --   85  ?  ? OT LONG TERM GOAL #5  ? Title Pt will resume use of RUE at least 90% of the time for ADLS/ IADLS with pain no greater than 3/10.   ? Time 8   ? Period Weeks   ? Status On-going   uses 75%, with pain 5-6/10 at worst  ? Target Date 02/18/22   ?  ? OT LONG TERM GOAL #6  ? Title Pt will perform simulated work activities modified independently   ? Status --   unable to perfrom due to pain and back prec.  ? Target Date 02/18/22   ?  ? OT LONG TERM GOAL #7  ? Title Pt will demonstrate ability to retrieve a lightweight object at 120 shoulder flexion with pain less than or equal to 3/10.   ? Time 8   ? Period Weeks   ? Status On-going   ? Target Date 02/18/22   ?  ? OT LONG TERM GOAL #8  ? Title Pt will increase RUE tip, and 3 pt pinch to 8 lbs or greater for increased functional use of RUE.   ? Baseline Tip 4 lbs, 3pt 4 lbs   ? Time 8   ? Period Weeks   ? Status On-going   ? Target Date 02/18/22   ? ?  ?  ? ?  ? ? ? ? ? ? ? ? Plan - 01/07/22 0746   ? ? Clinical Impression Statement Pt demonstrates steady progress. His shoulder is bothering him more today, however he reports that his back PT has had him using resistive bands. Therapist requests that pt stops the resistive activities with PT until his pain is improved.(Pt's shoulder was pain free again by end of OT session today.) Pt continues to demonstrate  intermittant swelling in his hand which limits function.   ? OT Occupational Profile and History Detailed Assessment- Review of Records and additional review of physical, cognitive, psychosocial history related

## 2022-01-11 ENCOUNTER — Ambulatory Visit: Payer: Worker's Compensation | Admitting: Occupational Therapy

## 2022-01-11 ENCOUNTER — Ambulatory Visit (INDEPENDENT_AMBULATORY_CARE_PROVIDER_SITE_OTHER): Payer: Worker's Compensation | Admitting: Orthopaedic Surgery

## 2022-01-11 ENCOUNTER — Encounter: Payer: Self-pay | Admitting: Occupational Therapy

## 2022-01-11 ENCOUNTER — Encounter: Payer: Self-pay | Admitting: Orthopaedic Surgery

## 2022-01-11 ENCOUNTER — Other Ambulatory Visit: Payer: Self-pay

## 2022-01-11 DIAGNOSIS — M25611 Stiffness of right shoulder, not elsewhere classified: Secondary | ICD-10-CM

## 2022-01-11 DIAGNOSIS — R278 Other lack of coordination: Secondary | ICD-10-CM

## 2022-01-11 DIAGNOSIS — M25531 Pain in right wrist: Secondary | ICD-10-CM

## 2022-01-11 DIAGNOSIS — R600 Localized edema: Secondary | ICD-10-CM

## 2022-01-11 DIAGNOSIS — G8929 Other chronic pain: Secondary | ICD-10-CM

## 2022-01-11 DIAGNOSIS — M25631 Stiffness of right wrist, not elsewhere classified: Secondary | ICD-10-CM

## 2022-01-11 DIAGNOSIS — M25511 Pain in right shoulder: Secondary | ICD-10-CM | POA: Diagnosis not present

## 2022-01-11 DIAGNOSIS — M6281 Muscle weakness (generalized): Secondary | ICD-10-CM

## 2022-01-11 MED ORDER — LIDOCAINE HCL 1 % IJ SOLN
3.0000 mL | INTRAMUSCULAR | Status: AC | PRN
Start: 2022-01-11 — End: 2022-01-11
  Administered 2022-01-11: 3 mL

## 2022-01-11 MED ORDER — METHYLPREDNISOLONE ACETATE 40 MG/ML IJ SUSP
40.0000 mg | INTRAMUSCULAR | Status: AC | PRN
  Administered 2022-01-11: 40 mg via INTRA_ARTICULAR

## 2022-01-11 NOTE — Progress Notes (Signed)
HPI: [name] returns today for follow-up of his right shoulder pain.  He states he still having pain despite going to therapy 8 visits.  He states pain comes and goes.  He feels that it physical therapy helps temporarily while he is at therapy once he gets home pain returns.  He is having no radicular symptoms down the arm.  No sensation of the shoulder subluxing or dislocating.  He does relate that he is still in therapy for his wrist which he fractured at the time of the injury and that he underwent kyphoplasty he is now having his lower back work-up due to pain in the lumbar spine region.  Patient is nondiabetic. ? ?Review of systems: Denies any fevers chills.  See HPI otherwise negative or noncontributory. ? ?Physical exam: General well-developed well-nourished male no acute distress mood and affect appropriate. ?Bilateral shoulders: 5 out of 5 strength with external and internal rotation against resistance.  Empty can test is negative bilaterally.  Liftoff test negative bilaterally.  Has full overhead activity both shoulders with forward flexion.  Impingement testing positive on the right with limited external rotation secondary to pain. ? ?Impression: Right shoulder pain ? ? ? ? ? ? ? ?Procedure Note ? ?Patient: Craig Church             ?Date of Birth: [birth year]           ?MRN: [id number]             ?Visit Date: 01/11/2022 ? ?Procedures: ?Visit Diagnoses:  ?1. Chronic right shoulder pain   ? ? ?Large Joint Inj: R subacromial bursa on 01/11/2022 10:56 AM ?Indications: pain ?Details: 22 G 1.5 in needle, superior approach ? ?Arthrogram: No ? ?Medications: 3 mL lidocaine 1 %; 40 mg methylPREDNISolone acetate 40 MG/ML ?Outcome: tolerated well, no immediate complications ?Procedure, treatment alternatives, risks and benefits explained, specific risks discussed. Consent was given by the patient. Immediately prior to procedure a time out was called to verify the correct patient, procedure, equipment, support staff and  site/side marked as required. Patient was prepped and draped in the usual sterile fashion.  ? ? ? ?Plan: Recommend he continue physical therapy for range of motion strengthening of the right shoulder.  If he still having significant pain in the next 2 weeks he will call our office and let us know so that we can order an MRI to rule out internal derangement of the right shoulder.  He will also call us if he is doing well.  Otherwise he will follow-up as needed.  Questions were encouraged and answered at length today.  Patient's clinical exam continued shoulder pain were discussed with Dr. Magnus Ivan today. ? ?

## 2022-01-11 NOTE — Therapy (Signed)
[facility] ?[place] ?[place], [place], [address] ?Phone: [phone]   Fax:  [phone] ? ?Occupational Therapy Treatment ? ?Patient Details  ?Name: Craig Church ?MRN: [id number] ?Date of Birth: 1966/06/15 ?Referring Provider (OT): Dr. Maryan Puls. [name] (Dr. Rush Farmer for shoulder, Dr. [name] for wrist) ? ? ?Encounter Date: 01/11/2022 ? ? OT End of Session - 01/11/22 1334   ? ? Visit Number 35   ? Number of Visits 45   ? Date for OT Re-Evaluation 02/17/22   ? Authorization Type workers comp additional 8 visits approved following the inital 16 visits 16+8=24, 8 additional visits approved 11/11/21   ? Authorization Time Period 8 additional visits approved on 12/22/21   ? Authorization - Visit Number 34   ? Authorization - Number of Visits 40   ? OT Start Time 1145   ? OT Stop Time 1310   ? OT Time Calculation (min) 85 min   ? Equipment Utilized During Treatment paraffin   ? Activity Tolerance Patient tolerated treatment well   ? Behavior During Therapy [facility] for tasks assessed/performed   ? ?  ?  ? ?  ? ? ?Past Medical History:  ?Diagnosis Date  ? Hypertension   ? Seizure disorder ([place])   ? ? ?Past Surgical History:  ?Procedure Laterality Date  ? CHOLECYSTECTOMY    ? OPEN REDUCTION INTERNAL FIXATION (ORIF) DISTAL PHALANX Right 07/12/2021  ? Procedure: OPEN REDUCTION INTERNAL FIXATION (ORIF) DISTAL RADIUS;  Surgeon: Cindra Presume, MD;  Location: [facility];  Service: Plastics;  Laterality: Right;  ? ? ?There were no vitals filed for this visit. ? ? Subjective Assessment - 01/11/22 1206   ? ? Subjective  Patient reports an injection in right shoulder today.  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 Pertinent History 56 yo male with onset of fall at work off a ladder was brought to [facility] on 9/23 and received ORIF for R distal radial fracture on 9/26.  Mildly displaced R ulnar styloid fracture not requiring sx.  Received TLSO for spinal injuries of T12 comp fx, T11  anterior wedging, B inferior articular process and spinous process fractures.  PMHx:  HA, colloid cyst of brain, idiopathic intracranial hypertension, LE edema, lumbar spondylosis and radiculopathy, chestpain, HLD, seizure disorder.   ? Limitations Back precuations from recent back surgery 12/03/21, no lifting over 15 lbs, continues tx for R shoulder and hand pain   ? Patient Stated Goals custom splint   ? Currently in Pain? Yes   ? Pain Score 2    ? Pain Location Shoulder   ? Pain Orientation Right   ? Pain Descriptors / Indicators Aching   ? Pain Type Acute pain   ? Pain Onset More than a month ago   ? Pain Frequency Intermittent   ? Aggravating Factors  overuse   ? Pain Relieving Factors [place], rest, gentle stretch   ? Multiple Pain Sites Yes   ? Pain Score 2   ? Pain Location Hand   ? Pain Orientation Right   ? Pain Descriptors / Indicators Aching   ? Pain Type Chronic pain   ? Pain Onset 1 to 4 weeks ago   ? Pain Frequency Intermittent   ? Aggravating Factors  swelling   ? Pain Relieving Factors rest   ? Pain Score 6   ? Pain Location Back   ? Pain Orientation Lower   ? Pain Descriptors / Indicators Spasm   ? Pain  Type Acute pain   ? Pain Onset 1 to 4 weeks ago   ? Pain Frequency Constant   ? Aggravating Factors  s/p recent back surgery   ? Pain Relieving Factors repositioning   ? ?  ?  ? ?  ? ? ? ? ? ? ? ? ? ? ? ? ? ? ? OT Treatments/Exercises (OP) - 01/11/22 0001   ? ?  ? Hand Exercises  ? Other Hand Exercises Patient shown median nerve glides right hand.  Patient with slightly diminished sensation in fingertips of right hand - (2-4)   continued work toward composite and hook fist following wrapped paraffin dip.   ?  ? Neurological Re-education Exercises  ? Other Exercises 1 Tight posterior shoulder capsule.  Increased  muscle activation anterior shoulder pulling humeral head forward.  Worked to drop - shoulder blade toward surface in supine.  Patient with improving active assisted range into flexion/abduction  without increased pain when able to actively relax anterior shoulder muscles.   ?  ? RUE Paraffin  ? RUE Paraffin Location Hand   ?  ? Manual Therapy  ? Manual Therapy Scapular mobilization;Joint mobilization;Taping   ? Joint Mobilization Worked on humeral movement stabilizing proximal shoulder.  Worked in supported supine (on wedge) initially - then upright to use UE ranger on wall at low position.  Working on flexion and scaption with emphasis on body position and scapular depression.   ? Scapular Mobilization Worked on isolating scapular motion - elevation and depression.  Patient had difficulty grading scapular depression, and had strong anterior pull of shoulder musculature.  Needed mod facilitation to maintain alignment of scapula on thorax during motion.  Worked on gentle glenohumeral joint motion with scapular alignment.   ? Kinesiotex Edema   right hand dorsum  ? ?  ?  ? ?  ? ? ? ? ? ? ? ? ? ? ? OT Short Term Goals - 01/11/22 1337   ? ?  ? OT SHORT TERM GOAL #1  ? Title I with splint wear, care and precautions   ? Status Achieved   ?  ? OT SHORT TERM GOAL #2  ? Title I with inital HEP   ? Status Achieved   ?  ? OT SHORT TERM GOAL #3  ? Title Pt will demonstrate ability to perfrom at least 90% composite finger flexion in prep for functional use.   ? Status Achieved   90%  ?  ? OT SHORT TERM GOAL #4  ? Title Pt will demonstrate wrist flexion/ extension of at least 40* in prep for functional use.   ? Period Weeks   ? Status Achieved   45*/ 40 extension  ?  ? OT SHORT TERM GOAL #5  ? Title I with edema control.   ? Status Achieved   ?  ? OT SHORT TERM GOAL #6  ? Title Pt will demonstrate 50* wrist flexion and 45* wrist extension in prep for functional use   ? Status Achieved   met 55 for flex, 50 for ext  ?  ? OT SHORT TERM GOAL #7  ? Title Pt will demonstrate forearm supination/ pronation of 85* in prep for work activities   ? Status Achieved   met 85  ?  ? OT SHORT TERM GOAL #8  ? Title Pt will demonstrate  grip strength of 23 lbs or greater in prep for functional use.   ? Status Achieved   11/16/21-30.7 lbs  ?  ?  OT SHORT TERM GOAL  #9  ? TITLE Pt will report increased ease with the following tasks: cutting food, opening containers, and squeezing toothpaste. Revised goal Pt will report increased ease with cutting food and opening continers using RUE.   ? Time 4   ? Period Weeks   ? Status Revised   still difficulty cutting food and opening contianers, met for squeeze toothpaste  ? Target Date 01/21/22   ?  ? OT SHORT TERM GOAL  #10  ? TITLE Pt will be I with updated HEP to address right shoulder ROM   ? Time 4   ? Period Weeks   ? Status On-going   ? Target Date 01/21/22   ?  ? OT SHORT TERM GOAL  #11  ? TITLE Pt will demonstrate ability to retrieve a lightweight object at 110 shoulder flexion with pain no greater than 3/10   ? Time 4   ? Period Weeks   ? Status On-going   ? Target Date 01/21/22   ?  ? OT SHORT TERM GOAL  #12  ? TITLE Pt will demonstrate 55* wrist extension for increased ease with ADLS.   ? Time 4   ? Period Weeks   ? Status On-going   ? Target Date 01/21/22   ?  ? OT SHORT TERM GOAL  #13  ? TITLE Pt will verbalize understanding of activity modification, and positioning for sleep to minimize shoulder pain   ? Time 4   ? Period Weeks   ? Status On-going   ? Target Date 01/21/22   ? ?  ?  ? ?  ? ? ? ? OT Long Term Goals - 01/11/22 1337   ? ?  ? OT LONG TERM GOAL #1  ? Title I with updated HEP.   ? Status On-going   Pt met updated HEP prior to surgery, however pt will benefit from continued updates  ? Target Date 02/18/22   ?  ? OT LONG TERM GOAL #2  ? Title Pt will demonstrate grip strength of at least 40 lbs in prep for work activities. Revised goal-Pt will demonstrate RUE grip strength of 45 lbs or greater for increased RUE functional use.   ? Time 8   ? Period Weeks   ? Status Revised   37.2  ? Target Date 02/18/22   ?  ? OT LONG TERM GOAL #3  ? Title Pt will demonstrate wrist flexion/ extension WFLS  for work activities.   ? Time 8   ? Period Weeks   ? Status On-going   ?  ? OT LONG TERM GOAL #4  ? Title Pt will demonstrate forearm supination/ pronation of at least 85* in prep for work.   ? Status --   2

## 2022-01-13 ENCOUNTER — Ambulatory Visit: Payer: Worker's Compensation | Admitting: Occupational Therapy

## 2022-01-13 DIAGNOSIS — M6281 Muscle weakness (generalized): Secondary | ICD-10-CM

## 2022-01-13 DIAGNOSIS — M25631 Stiffness of right wrist, not elsewhere classified: Secondary | ICD-10-CM

## 2022-01-13 DIAGNOSIS — M25611 Stiffness of right shoulder, not elsewhere classified: Secondary | ICD-10-CM

## 2022-01-13 DIAGNOSIS — G8929 Other chronic pain: Secondary | ICD-10-CM

## 2022-01-13 DIAGNOSIS — M25531 Pain in right wrist: Secondary | ICD-10-CM

## 2022-01-13 DIAGNOSIS — R278 Other lack of coordination: Secondary | ICD-10-CM

## 2022-01-13 DIAGNOSIS — R600 Localized edema: Secondary | ICD-10-CM

## 2022-01-13 DIAGNOSIS — M25511 Pain in right shoulder: Secondary | ICD-10-CM | POA: Diagnosis not present

## 2022-01-13 NOTE — Therapy (Signed)
[facility] ?[place], [place], [address] ?Phone: [phone]   Fax:  [phone] ? ?Occupational Therapy Treatment ? ?Patient Details  ?Name: Craig Church ?MRN: [id number] ?Date of Birth: [birth year] ?Referring Provider (OT): Dr. Maryan Puls. [name] (Dr. Rush Farmer for shoulder, Dr. [name] for wrist) ? ? ?Encounter Date: 01/13/2022 ? ? OT End of Session - 01/13/22 1556   ? ? Visit Number 36   ? Number of Visits 45   ? Date for OT Re-Evaluation 02/17/22   ? Authorization Type workers comp additional 8 visits approved following the inital 16 visits 16+8=24, 8 additional visits approved 11/11/21   ? Authorization Time Period 8 additional visits approved on 12/22/21   ? Authorization - Visit Number 35   ? Authorization - Number of Visits 40   ? ?  ?  ? ?  ? ? ?Past Medical History:  ?Diagnosis Date  ? Hypertension   ? Seizure disorder ([place])   ? ? ?Past Surgical History:  ?Procedure Laterality Date  ? CHOLECYSTECTOMY    ? OPEN REDUCTION INTERNAL FIXATION (ORIF) DISTAL PHALANX Right 07/12/2021  ? Procedure: OPEN REDUCTION INTERNAL FIXATION (ORIF) DISTAL RADIUS;  Surgeon: Cindra Presume, MD;  Location: [facility];  Service: Plastics;  Laterality: Right;  ? ? ?There were no vitals filed for this visit. ? ? ? ? ? ? ? ? ? ? ? ? ? ? ? ? OT Treatments/Exercises (OP) - 01/13/22 0001   ? ?  ? ADLs  ? Leisure Patient was active with fishing and golfing prior to injury.  Discussed adaptations to fishing (e.g. closed reel - fishing from shore or dock versus in boat) adapted grips for clubs and/or just working on putting (back limitations)   ? ADL Comments Patient reports that his case worker is recommending a second opinion regarding hand functioning.   ?  ? Neurological Re-education Exercises  ? Other Exercises 1 Continued work to improve shoulder range of motion.  Patient initially had 130 active shoulder flexion in reclined position (supine on wedge).  Provided light traction  and pasisve stretch - to 150 degrees shoulder flexion. Patient then able to follow with 142 degrees of active shoulder flex.   ?  ? Ultrasound  ? Ultrasound Location anterior/superior/posterior shoulder   ? Ultrasound Parameters 44mz, continuous, 0.8w/cm2, x 12 min   ? Ultrasound Goals Edema;Pain   ?  ? RUE Paraffin  ? Number Minutes Paraffin 12 Minutes   ? RUE Paraffin Location Hand   ? Comments wrapped in composite flexion   ?  ? Manual Therapy  ? Manual Therapy Scapular mobilization   ? Joint Mobilization Worked on humeral movement stabilizing proximal shoulder.  Worked in supported supine (on wedge).  Working to decrease joint capsule tightness   ? ?  ?  ? ?  ? ? ? ? ? ? ? ? ? ? ? OT Short Term Goals - 01/13/22 1602   ? ?  ? OT SHORT TERM GOAL #1  ? Title I with splint wear, care and precautions   ? Status Achieved   ?  ? OT SHORT TERM GOAL #2  ? Title I with inital HEP   ? Status Achieved   ?  ? OT SHORT TERM GOAL #3  ? Title Pt will demonstrate ability to perfrom at least 90% composite finger flexion in prep for functional use.   ? Status Achieved   90%  ?  ? OT SHORT TERM GOAL #4  ?  Title Pt will demonstrate wrist flexion/ extension of at least 40* in prep for functional use.   ? Period Weeks   ? Status Achieved   45*/ 40 extension  ?  ? OT SHORT TERM GOAL #5  ? Title I with edema control.   ? Status Achieved   ?  ? OT SHORT TERM GOAL #6  ? Title Pt will demonstrate 50* wrist flexion and 45* wrist extension in prep for functional use   ? Status Achieved   met 55 for flex, 50 for ext  ?  ? OT SHORT TERM GOAL #7  ? Title Pt will demonstrate forearm supination/ pronation of 85* in prep for work activities   ? Status Achieved   met 85  ?  ? OT SHORT TERM GOAL #8  ? Title Pt will demonstrate grip strength of 23 lbs or greater in prep for functional use.   ? Status Achieved   11/16/21-30.7 lbs  ?  ? OT SHORT TERM GOAL  #9  ? TITLE Pt will report increased ease with the following tasks: cutting food, opening  containers, and squeezing toothpaste. Revised goal Pt will report increased ease with cutting food and opening continers using RUE.   ? Time 4   ? Period Weeks   ? Status Revised   still difficulty cutting food and opening contianers, met for squeeze toothpaste  ? Target Date 01/21/22   ?  ? OT SHORT TERM GOAL  #10  ? TITLE Pt will be I with updated HEP to address right shoulder ROM   ? Time 4   ? Period Weeks   ? Status On-going   ? Target Date 01/21/22   ?  ? OT SHORT TERM GOAL  #11  ? TITLE Pt will demonstrate ability to retrieve a lightweight object at 110 shoulder flexion with pain no greater than 3/10   ? Time 4   ? Period Weeks   ? Status On-going   ? Target Date 01/21/22   ?  ? OT SHORT TERM GOAL  #12  ? TITLE Pt will demonstrate 55* wrist extension for increased ease with ADLS.   ? Time 4   ? Period Weeks   ? Status On-going   ? Target Date 01/21/22   ?  ? OT SHORT TERM GOAL  #13  ? TITLE Pt will verbalize understanding of activity modification, and positioning for sleep to minimize shoulder pain   ? Time 4   ? Period Weeks   ? Status On-going   ? Target Date 01/21/22   ? ?  ?  ? ?  ? ? ? ? OT Long Term Goals - 01/13/22 1603   ? ?  ? OT LONG TERM GOAL #1  ? Title I with updated HEP.   ? Status On-going   Pt met updated HEP prior to surgery, however pt will benefit from continued updates  ? Target Date 02/18/22   ?  ? OT LONG TERM GOAL #2  ? Title Pt will demonstrate grip strength of at least 40 lbs in prep for work activities. Revised goal-Pt will demonstrate RUE grip strength of 45 lbs or greater for increased RUE functional use.   ? Time 8   ? Period Weeks   ? Status Revised   37.2  ? Target Date 02/18/22   ?  ? OT LONG TERM GOAL #3  ? Title Pt will demonstrate wrist flexion/ extension WFLS for work activities.   ? Time 8   ?  Period Weeks   ? Status On-going   ?  ? OT LONG TERM GOAL #4  ? Title Pt will demonstrate forearm supination/ pronation of at least 85* in prep for work.   ? Status --   85  ?  ? OT  LONG TERM GOAL #5  ? Title Pt will resume use of RUE at least 90% of the time for ADLS/ IADLS with pain no greater than 3/10.   ? Time 8   ? Period Weeks   ? Status On-going   uses 75%, with pain 5-6/10 at worst  ? Target Date 02/18/22   ?  ? OT LONG TERM GOAL #6  ? Title Pt will perform simulated work activities modified independently   ? Status --   unable to perfrom due to pain and back prec.  ? Target Date 02/18/22   ?  ? OT LONG TERM GOAL #7  ? Title Pt will demonstrate ability to retrieve a lightweight object at 120 shoulder flexion with pain less than or equal to 3/10.   ? Time 8   ? Period Weeks   ? Status On-going   ? Target Date 02/18/22   ?  ? OT LONG TERM GOAL #8  ? Title Pt will increase RUE tip, and 3 pt pinch to 8 lbs or greater for increased functional use of RUE.   ? Baseline Tip 4 lbs, 3pt 4 lbs   ? Time 8   ? Period Weeks   ? Status On-going   ? Target Date 02/18/22   ? ?  ?  ? ?  ? ? ? ? ? ? ? ? Plan - 01/13/22 1557   ? ? Clinical Impression Statement Pt frustrated regarding state of hand - continues to experience swelling and stiffness, and has limited grip strength.  Patient would like to get back to leisure interests, e.g. fishing and golfing.   ? OT Occupational Profile and History Detailed Assessment- Review of Records and additional review of physical, cognitive, psychosocial history related to current functional performance   ? Occupational performance deficits (Please refer to evaluation for details): ADL's;IADL's;Work;Social Participation;Rest and Sleep;Leisure   ? Body Structure / Function / Physical Skills ADL;UE functional use;Endurance;Balance;Flexibility;Pain;FMC;ROM;Coordination;GMC;Decreased knowledge of precautions;Sensation;Scar mobility;Decreased knowledge of use of DME;IADL;Skin integrity;Dexterity;Strength;Mobility;Edema   ? Rehab Potential Good   ? Clinical Decision Making Limited treatment options, no task modification necessary   ? Comorbidities Affecting Occupational  Performance: May have comorbidities impacting occupational performance   ? Modification or Assistance to Complete Evaluation  Min-Moderate modification of tasks or assist with assess necessary to complete eval

## 2022-01-17 NOTE — Progress Notes (Signed)
? ?Referring Provider ?Jettie Booze, NP ?[facility] ?[place],  [place] [address]  ? ?CC:  ?Chief Complaint  ?Patient presents with  ? Follow-up  ?   ? ?Craig Church is an 56 y.o. male.  ?HPI: Patient is a 56 year old male 6 months postop from ORIF comminuted right distal radial fracture performed 07/12/2021 by Dr. [name] who presents to clinic for 34-month follow-up. ? ?Reviewed his chart and he has been seen by orthopedics and occupational therapy regularly since his last encounter here in clinic 11/18/2021.  At previous encounter, he complained of persistent stiffness primarily at the MP joints.  MP passive and active ROM was limited to approximately 60 degrees.  Plain films obtained most recently on 12/21/2021 reveal postsurgical changes with moderate progressive fracture line healing sclerosis.  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 ? ?Patient presents today with his significant other.  He reports that he has continued with therapy, he is frustrated with his lack of full range of motion and his right hand at this time.  He reports he is also bothered by his inability to make a full fist.  He reports that his hobbies are fishing and golfing and this has prevented him from being able to do those. ? ?He does report that with wax therapy, he is able to make a full fist.  ? ?Allergies  ?Allergen Reactions  ? Penicillins Other (See Comments)  ?  Unknown childhood reaction  ? ? ?Outpatient Encounter Medications as of 01/19/2022  ?Medication Sig Note  ? aspirin EC 81 MG tablet Take 81 mg by mouth at bedtime. Swallow whole.   ? atorvastatin (LIPITOR) 10 MG tablet Take 10 mg by mouth See admin instructions. Take one tablet (10 mg) by mouth every other night   ? diclofenac Sodium (VOLTAREN) 1 % GEL Apply 1 application topically daily as needed (arthritis pain).   ? diclofenac Sodium (VOLTAREN) 1 % GEL Apply topically.   ? gabapentin (NEURONTIN) 800 MG tablet Take 800  mg by mouth 2 (two) times daily.   ? losartan-hydrochlorothiazide (HYZAAR) 50-12.5 MG tablet Take 1 tablet by mouth every morning.   ? naproxen (NAPROSYN) 500 MG tablet Take 500 mg by mouth 2 (two) times daily.   ? rOPINIRole (REQUIP) 1 MG tablet Take 1 mg by mouth 2 (two) times daily.   ? topiramate (TOPAMAX) 100 MG tablet Take 100 mg by mouth 2 (two) times daily.   ? venlafaxine XR (EFFEXOR-XR) 37.5 MG 24 hr capsule Take 75 mg by mouth every morning. 07/09/2021: Prescribed 06/25/2021 - 1 daily for 2 weeks, then 2 daily - pt started taking 2 daily this morning  ? ?No facility-administered encounter medications on file as of 01/19/2022.  ?  ? ?Past Medical History:  ?Diagnosis Date  ? Hypertension   ? Seizure disorder ([place])   ? ? ?Past Surgical History:  ?Procedure Laterality Date  ? CHOLECYSTECTOMY    ? OPEN REDUCTION INTERNAL FIXATION (ORIF) DISTAL PHALANX Right 07/12/2021  ? Procedure: OPEN REDUCTION INTERNAL FIXATION (ORIF) DISTAL RADIUS;  Surgeon: Cindra Presume, MD;  Location: [facility];  Service: Plastics;  Laterality: Right;  ? ? ?No family history on file. ? ?Social History  ? ?Social History Narrative  ? Not on file  ?  ? ?Review of Systems ?General: Denies fevers or chills ? ?Physical Exam ? ?  07/20/2021  ?  7:00 AM 07/19/2021  ?  9:16 PM 07/19/2021  ?  3:00 PM  ?Vitals with BMI  ?Systolic [id number]  ?Diastolic 74 74 67  ?Pulse 59 66 89  ?  ?General:  No acute distress, nontoxic appearing  ?Respiratory: No increased work of breathing ?Neuro: Alert and oriented ?Psychiatric: Normal mood and affect  ? ?Right Hand: Right wrist incision in-tact, well healed. Swelling of wrist and hand noted. Palpable radial pulse noted. Tenderness of triscaphe joint noted. Sensation in-tact distally. Cap refill normal. He can flex all fingers, MP joint active motion to approximately 90 degrees through all digits. ?Flexion of wrist approximately 30 degrees at this time. ?Pronation, supination intact ? ?X-ray of right hand and wrist  reviewed with patient. ? ?Assessment/Plan ?Patient slowly improving after ORIF of radial fracture. X-ray 12/21/21  showed near anatomical alignment of hardware, progressive healing. He is progressing in PT, slowly, but steadily. He is frustrated with the length of time it has taken for improvement.  ? ?We reviewed x-ray images together during today's appointment, all of his questions and his significant other's questions were answered to their content. We discussed ongoing therapy as the best option at this time for continuing to optimize his functional status. We discussed these types of injuries take time to heal and sometimes, full function is not restored. They were understanding of this, but I encouraged patient that with continuing therapy, I suspect he will continue to notice improvement. ? ?Recommend follow up in 3 months for re-evaluation ? ?Carola Rhine Jeovanni Heuring ?01/19/2022, 2:42 PM  ? ? ?  ? ?

## 2022-01-18 ENCOUNTER — Encounter: Payer: Self-pay | Admitting: Occupational Therapy

## 2022-01-18 ENCOUNTER — Ambulatory Visit: Payer: Worker's Compensation | Attending: Family Medicine | Admitting: Occupational Therapy

## 2022-01-18 DIAGNOSIS — R2689 Other abnormalities of gait and mobility: Secondary | ICD-10-CM | POA: Insufficient documentation

## 2022-01-18 DIAGNOSIS — M6281 Muscle weakness (generalized): Secondary | ICD-10-CM | POA: Insufficient documentation

## 2022-01-18 DIAGNOSIS — R278 Other lack of coordination: Secondary | ICD-10-CM | POA: Insufficient documentation

## 2022-01-18 DIAGNOSIS — R600 Localized edema: Secondary | ICD-10-CM | POA: Insufficient documentation

## 2022-01-18 DIAGNOSIS — M25531 Pain in right wrist: Secondary | ICD-10-CM | POA: Insufficient documentation

## 2022-01-18 DIAGNOSIS — M25611 Stiffness of right shoulder, not elsewhere classified: Secondary | ICD-10-CM | POA: Insufficient documentation

## 2022-01-18 DIAGNOSIS — M25631 Stiffness of right wrist, not elsewhere classified: Secondary | ICD-10-CM | POA: Insufficient documentation

## 2022-01-18 DIAGNOSIS — G8929 Other chronic pain: Secondary | ICD-10-CM | POA: Diagnosis present

## 2022-01-18 DIAGNOSIS — M25511 Pain in right shoulder: Secondary | ICD-10-CM | POA: Diagnosis present

## 2022-01-18 NOTE — Therapy (Signed)
[facility] ?[place], [place], [address] ?Phone: [phone]   Fax:  [phone] ? ?Occupational Therapy Treatment ? ?Patient Details  ?Name: Craig Church ?MRN: [id number] ?Date of Birth: 1966-03-13 ?Referring Provider (OT): Dr. Maryan Puls. [name] (Dr. Rush Farmer for shoulder, Dr. [name] for wrist) ? ? ?Encounter Date: 01/18/2022 ? ? OT End of Session - 01/18/22 1800   ? ? Visit Number 56   ? Number of Visits 45   ? Date for OT Re-Evaluation 02/17/22   ? Authorization Type workers comp additional 8 visits approved following the inital 16 visits 16+8=24, 8 additional visits approved 11/11/21   ? Authorization Time Period 8 additional visits approved on 12/22/21   ? Authorization - Visit Number 35   ? Authorization - Number of Visits 40   ? OT Start Time 1400   ? OT Stop Time 1530   ? OT Time Calculation (min) 90 min   ? Equipment Utilized During Treatment paraffin, [place]   ? Activity Tolerance Patient tolerated treatment well   ? Behavior During Therapy [facility] for tasks assessed/performed   ? ?  ?  ? ?  ? ? ?Past Medical History:  ?Diagnosis Date  ? Hypertension   ? Seizure disorder ([place])   ? ? ?Past Surgical History:  ?Procedure Laterality Date  ? CHOLECYSTECTOMY    ? OPEN REDUCTION INTERNAL FIXATION (ORIF) DISTAL PHALANX Right 07/12/2021  ? Procedure: OPEN REDUCTION INTERNAL FIXATION (ORIF) DISTAL RADIUS;  Surgeon: Cindra Presume, MD;  Location: [facility];  Service: Plastics;  Laterality: Right;  ? ? ?There were no vitals filed for this visit. ? ? Subjective Assessment - 01/18/22 1753   ? ? Subjective  Patient reports he sees hand surgeon tomorrow   ? Pertinent History 56 yo male with onset of fall at work off a ladder was brought to [facility] on 9/23 and received ORIF for R distal radial fracture on 9/26.  Mildly displaced R ulnar styloid fracture not requiring sx.  Received TLSO for spinal injuries of T12 comp fx, T11 anterior wedging, B inferior articular process and  spinous process fractures.  PMHx:  HA, colloid cyst of brain, idiopathic intracranial hypertension, LE edema, lumbar spondylosis and radiculopathy, chestpain, HLD, seizure disorder.   ? Limitations Back precuations from recent back surgery 12/03/21, no lifting over 15 lbs, continues tx for R shoulder and hand pain   ? Patient Stated Goals custom splint   ? Currently in Pain? Yes   ? Pain Score 2    ? Pain Location Shoulder   ? Pain Orientation Right   ? Pain Descriptors / Indicators Aching   ? Pain Type Acute pain   ? Pain Onset More than a month ago   ? Pain Frequency Intermittent   ? Aggravating Factors  overuse, poor positioning   ? Pain Relieving Factors rest, gentle stretch, ultrasound   ? Multiple Pain Sites Yes   ? Pain Score 2   ? Pain Location Hand   ? Pain Orientation Right   ? Pain Descriptors / Indicators Aching   ? Pain Type Chronic pain   ? Pain Onset More than a month ago   ? Pain Frequency Intermittent   ? Aggravating Factors  swelling   ? Pain Relieving Factors rest   ? ?  ?  ? ?  ? ? ? ? ? ? ? ? ? ? ? ? ? ? ? OT Treatments/Exercises (OP) - 01/18/22 0001   ? ?  ?  ADLs  ? Leisure Worked on casting with closed reel outside.  Patient able to cast with sufficient force, and precision to hit targets.  Patient may try fishing this weekend.   ?  ? Neurological Re-education Exercises  ? Other Exercises 1 Working to improve active relaxation and alignment of right shoulder for improving range of motion and less pain.  Patient responds well to realignment of shoulder, yet had intermittent spasms in shoulder with fast movement outside of available range.  Able to apply very light resistance to movements at 120 degrees of shoulder flexion/scaption.   ?  ? Ultrasound  ? Ultrasound Location anterior/superior, posterior shoulder   ? Ultrasound Parameters 89mz, continuous, 0.8w/cm2 x 12 min   ? Ultrasound Goals Edema;Pain   ?  ? RUE Paraffin  ? Number Minutes Paraffin 12 Minutes   ? RUE Paraffin Location Hand   ?  Comments wrapped in flexion   ?  ? Manual Therapy  ? Manual Therapy Scapular mobilization   ? Scapular Mobilization tipping right ear toward right shoulder to lessen pull on right shoulder from forces above,.   ? ?  ?  ? ?  ? ? ? ? ? ? ? ? ? ? ? OT Short Term Goals - 01/18/22 1801   ? ?  ? OT SHORT TERM GOAL #1  ? Title I with splint wear, care and precautions   ? Status Achieved   ?  ? OT SHORT TERM GOAL #2  ? Title I with inital HEP   ? Status Achieved   ?  ? OT SHORT TERM GOAL #3  ? Title Pt will demonstrate ability to perfrom at least 90% composite finger flexion in prep for functional use.   ? Status Achieved   90%  ?  ? OT SHORT TERM GOAL #4  ? Title Pt will demonstrate wrist flexion/ extension of at least 40* in prep for functional use.   ? Period Weeks   ? Status Achieved   45*/ 40 extension  ?  ? OT SHORT TERM GOAL #5  ? Title I with edema control.   ? Status Achieved   ?  ? OT SHORT TERM GOAL #6  ? Title Pt will demonstrate 50* wrist flexion and 45* wrist extension in prep for functional use   ? Status Achieved   met 55 for flex, 50 for ext  ?  ? OT SHORT TERM GOAL #7  ? Title Pt will demonstrate forearm supination/ pronation of 85* in prep for work activities   ? Status Achieved   met 85  ?  ? OT SHORT TERM GOAL #8  ? Title Pt will demonstrate grip strength of 23 lbs or greater in prep for functional use.   ? Status Achieved   11/16/21-30.7 lbs  ?  ? OT SHORT TERM GOAL  #9  ? TITLE Pt will report increased ease with the following tasks: cutting food, opening containers, and squeezing toothpaste. Revised goal Pt will report increased ease with cutting food and opening continers using RUE.   ? Time 4   ? Period Weeks   ? Status Revised   still difficulty cutting food and opening contianers, met for squeeze toothpaste  ? Target Date 01/21/22   ?  ? OT SHORT TERM GOAL  #10  ? TITLE Pt will be I with updated HEP to address right shoulder ROM   ? Time 4   ? Period Weeks   ? Status On-going   ?  Target Date  01/21/22   ?  ? OT SHORT TERM GOAL  #11  ? TITLE Pt will demonstrate ability to retrieve a lightweight object at 110 shoulder flexion with pain no greater than 3/10   ? Time 4   ? Period Weeks   ? Status On-going   ? Target Date 01/21/22   ?  ? OT SHORT TERM GOAL  #12  ? TITLE Pt will demonstrate 55* wrist extension for increased ease with ADLS.   ? Time 4   ? Period Weeks   ? Status On-going   ? Target Date 01/21/22   ?  ? OT SHORT TERM GOAL  #13  ? TITLE Pt will verbalize understanding of activity modification, and positioning for sleep to minimize shoulder pain   ? Time 4   ? Period Weeks   ? Status On-going   ? Target Date 01/21/22   ? ?  ?  ? ?  ? ? ? ? OT Long Term Goals - 01/18/22 1801   ? ?  ? OT LONG TERM GOAL #1  ? Title I with updated HEP.   ? Status On-going   Pt met updated HEP prior to surgery, however pt will benefit from continued updates  ? Target Date 02/18/22   ?  ? OT LONG TERM GOAL #2  ? Title Pt will demonstrate grip strength of at least 40 lbs in prep for work activities. Revised goal-Pt will demonstrate RUE grip strength of 45 lbs or greater for increased RUE functional use.   ? Time 8   ? Period Weeks   ? Status Revised   37.2  ? Target Date 02/18/22   ?  ? OT LONG TERM GOAL #3  ? Title Pt will demonstrate wrist flexion/ extension WFLS for work activities.   ? Time 8   ? Period Weeks   ? Status On-going   ?  ? OT LONG TERM GOAL #4  ? Title Pt will demonstrate forearm supination/ pronation of at least 85* in prep for work.   ? Status --   85  ?  ? OT LONG TERM GOAL #5  ? Title Pt will resume use of RUE at least 90% of the time for ADLS/ IADLS with pain no greater than 3/10.   ? Time 8   ? Period Weeks   ? Status On-going   uses 75%, with pain 5-6/10 at worst  ? Target Date 02/18/22   ?  ? OT LONG TERM GOAL #6  ? Title Pt will perform simulated work activities modified independently   ? Status --   unable to perfrom due to pain and back prec.  ? Target Date 02/18/22   ?  ? OT LONG TERM GOAL #7   ? Title Pt will demonstrate ability to retrieve a lightweight object at 120 shoulder flexion with pain less than or equal to 3/10.   ? Time 8   ? Period Weeks   ? Status On-going   ? Target Date 02/18/22

## 2022-01-19 ENCOUNTER — Other Ambulatory Visit: Payer: Self-pay

## 2022-01-19 ENCOUNTER — Ambulatory Visit (INDEPENDENT_AMBULATORY_CARE_PROVIDER_SITE_OTHER): Payer: Worker's Compensation | Admitting: Surgical

## 2022-01-19 DIAGNOSIS — S52501A Unspecified fracture of the lower end of right radius, initial encounter for closed fracture: Secondary | ICD-10-CM

## 2022-01-20 ENCOUNTER — Ambulatory Visit: Payer: Worker's Compensation | Admitting: Occupational Therapy

## 2022-01-20 ENCOUNTER — Encounter: Payer: Self-pay | Admitting: Occupational Therapy

## 2022-01-20 DIAGNOSIS — M6281 Muscle weakness (generalized): Secondary | ICD-10-CM

## 2022-01-20 DIAGNOSIS — M25531 Pain in right wrist: Secondary | ICD-10-CM

## 2022-01-20 DIAGNOSIS — R278 Other lack of coordination: Secondary | ICD-10-CM

## 2022-01-20 DIAGNOSIS — M25631 Stiffness of right wrist, not elsewhere classified: Secondary | ICD-10-CM

## 2022-01-20 DIAGNOSIS — M25611 Stiffness of right shoulder, not elsewhere classified: Secondary | ICD-10-CM

## 2022-01-20 DIAGNOSIS — R600 Localized edema: Secondary | ICD-10-CM

## 2022-01-20 DIAGNOSIS — G8929 Other chronic pain: Secondary | ICD-10-CM

## 2022-01-20 NOTE — Therapy (Signed)
[facility], [place], [address] ?Phone: [phone]   Fax:  [phone] ? ?Occupational Therapy Treatment ? ?Patient Details  ?Name: Craig Church ?MRN: [id number] ?Date of Birth: 1966-10-09 ?Referring Provider (OT): Dr. Maryan Puls. [name] (Dr. Rush Farmer for shoulder, Dr. [name] for wrist) ? ? ?Encounter Date: 01/20/2022 ? ? OT End of Session - 01/20/22 1700   ? ? Visit Number 38   ? Number of Visits 45   ? Date for OT Re-Evaluation 02/17/22   ? Authorization Type workers comp additional 8 visits approved following the inital 16 visits 16+8=24, 8 additional visits approved 11/11/21   ? Authorization Time Period 8 additional visits approved on 12/22/21   ? Authorization - Visit Number 70   ? Authorization - Number of Visits [place] 8 ADD'L VISITS 01/19/22  ? OT Start Time 1400   ? OT Stop Time 1510   ? OT Time Calculation (min) 70 min   ? Equipment Utilized During Treatment Hot pack, fluidotherapy   ? Activity Tolerance Patient tolerated treatment well   ? Behavior During Therapy [facility] for tasks assessed/performed   ? ?  ?  ? ?  ? ? ?Past Medical History:  ?Diagnosis Date  ? Hypertension   ? Seizure disorder ([place])   ? ? ?Past Surgical History:  ?Procedure Laterality Date  ? CHOLECYSTECTOMY    ? OPEN REDUCTION INTERNAL FIXATION (ORIF) DISTAL PHALANX Right 07/12/2021  ? Procedure: OPEN REDUCTION INTERNAL FIXATION (ORIF) DISTAL RADIUS;  Surgeon: Cindra Presume, MD;  Location: [place];  Service: Plastics;  Laterality: Right;  ? ? ?There were no vitals filed for this visit. ? ? Subjective Assessment - 01/20/22 1514   ? ? Subjective  "It's all jammed up"  regarding wrist and hand bones   ? Pertinent History 56 yo male with onset of fall at work off a ladder was brought to [place] on 9/23 and received ORIF for R distal radial fracture on 9/26.  Mildly displaced R ulnar styloid fracture not requiring sx.  Received TLSO for spinal injuries of T12 comp  fx, T11 anterior wedging, B inferior articular process and spinous process fractures.  PMHx:  HA, colloid cyst of brain, idiopathic intracranial hypertension, LE edema, lumbar spondylosis and radiculopathy, chestpain, HLD, seizure disorder.   ? Limitations Back precuations from recent back surgery 12/03/21, no lifting over 15 lbs, continues tx for R shoulder and hand pain   ? Currently in Pain? Yes   ? Pain Score 4    ? Pain Location Shoulder   ? Pain Orientation Right   ? Pain Descriptors / Indicators Aching   ? Pain Type Acute pain   ? Pain Onset More than a month ago   ? Pain Frequency Constant   ? Aggravating Factors  spasm,   ? Pain Relieving Factors rest, medication   ? Multiple Pain Sites Yes   ? Pain Score 4   ? Pain Location Hand   ? Pain Orientation Right   ? Pain Descriptors / Indicators Aching   ? Pain Type Chronic pain   ? Pain Onset More than a month ago   ? Pain Frequency Constant   ? Aggravating Factors  swelling   ? Pain Relieving Factors heat, rest   ? ?  ?  ? ?  ? ? ? ? ? ? ? ? ? ? ? ? ? ? ? OT Treatments/Exercises (OP) - 01/20/22 0001   ? ?  ?  Moist Heat Therapy  ? Number Minutes Moist Heat 10 Minutes   ? Moist Heat Location Shoulder   ?  ? RUE Fluidotherapy  ? Number Minutes Fluidotherapy 12 Minutes   ? RUE Fluidotherapy Location Hand;Wrist;Forearm   ? Comments reduce pain   ?  ? Manual Therapy  ? Joint Mobilization Digits MCP/PIP, mid carpal with gentle motion to promote increased flexion and extension.  Increase radius movement for supination   ? Passive ROM AAROM Right shoulder in supported supine.  Patient has been having episodes of spasming in his shoulder.  At times due to end range stretch, but also with minimal movement,.  Patient does have muscle relaxant that he takes daily.  He can take more per his report - instructed him that this may be helpful while arm is spasming.  Encouraged patient to follow up with MD re further objective studies for shoulder.   ? ?  ?  ? ?   ? ? ? ? ? ? ? ? ? ? ? OT Short Term Goals - 01/20/22 1703   ? ?  ? OT SHORT TERM GOAL #1  ? Title I with splint wear, care and precautions   ? Status Achieved   ?  ? OT SHORT TERM GOAL #2  ? Title I with inital HEP   ? Status Achieved   ?  ? OT SHORT TERM GOAL #3  ? Title Pt will demonstrate ability to perfrom at least 90% composite finger flexion in prep for functional use.   ? Status Achieved   90%  ?  ? OT SHORT TERM GOAL #4  ? Title Pt will demonstrate wrist flexion/ extension of at least 40* in prep for functional use.   ? Period Weeks   ? Status Achieved   45*/ 40 extension  ?  ? OT SHORT TERM GOAL #5  ? Title I with edema control.   ? Status Achieved   ?  ? OT SHORT TERM GOAL #6  ? Title Pt will demonstrate 50* wrist flexion and 45* wrist extension in prep for functional use   ? Status Achieved   met 55 for flex, 50 for ext  ?  ? OT SHORT TERM GOAL #7  ? Title Pt will demonstrate forearm supination/ pronation of 85* in prep for work activities   ? Status Achieved   met 85  ?  ? OT SHORT TERM GOAL #8  ? Title Pt will demonstrate grip strength of 23 lbs or greater in prep for functional use.   ? Status Achieved   11/16/21-30.7 lbs  ?  ? OT SHORT TERM GOAL  #9  ? TITLE Pt will report increased ease with the following tasks: cutting food, opening containers, and squeezing toothpaste. Revised goal Pt will report increased ease with cutting food and opening continers using RUE.   ? Time 4   ? Period Weeks   ? Status Revised   still difficulty cutting food and opening contianers, met for squeeze toothpaste  ? Target Date 01/21/22   ?  ? OT SHORT TERM GOAL  #10  ? TITLE Pt will be I with updated HEP to address right shoulder ROM   ? Time 4   ? Period Weeks   ? Status On-going   ? Target Date 01/21/22   ?  ? OT SHORT TERM GOAL  #11  ? TITLE Pt will demonstrate ability to retrieve a lightweight object at 110 shoulder flexion with pain no greater than  3/10   ? Time 4   ? Period Weeks   ? Status On-going   ? Target Date  01/21/22   ?  ? OT SHORT TERM GOAL  #12  ? TITLE Pt will demonstrate 55* wrist extension for increased ease with ADLS.   ? Time 4   ? Period Weeks   ? Status On-going   ? Target Date 01/21/22   ?  ? OT SHORT TERM GOAL  #13  ? TITLE Pt will verbalize understanding of activity modification, and positioning for sleep to minimize shoulder pain   ? Time 4   ? Period Weeks   ? Status On-going   ? Target Date 01/21/22   ? ?  ?  ? ?  ? ? ? ? OT Long Term Goals - 01/20/22 1703   ? ?  ? OT LONG TERM GOAL #1  ? Title I with updated HEP.   ? Status On-going   Pt met updated HEP prior to surgery, however pt will benefit from continued updates  ? Target Date 02/18/22   ?  ? OT LONG TERM GOAL #2  ? Title Pt will demonstrate grip strength of at least 40 lbs in prep for work activities. Revised goal-Pt will demonstrate RUE grip strength of 45 lbs or greater for increased RUE functional use.   ? Time 8   ? Period Weeks   ? Status Revised   37.2  ? Target Date 02/18/22   ?  ? OT LONG TERM GOAL #3  ? Title Pt will demonstrate wrist flexion/ extension WFLS for work activities.   ? Time 8   ? Period Weeks   ? Status On-going   ?  ? OT LONG TERM GOAL #4  ? Title Pt will demonstrate forearm supination/ pronation of at least 85* in prep for work.   ? Status --   85  ?  ? OT LONG TERM GOAL #5  ? Title Pt will resume use of RUE at least 90% of the time for ADLS/ IADLS with pain no greater than 3/10.   ? Time 8   ? Period Weeks   ? Status On-going   uses 75%, with pain 5-6/10 at worst  ? Target Date 02/18/22   ?  ? OT LONG TERM GOAL #6  ? Title Pt will perform simulated work activities modified independently   ? Status --   unable to perfrom due to pain and back prec.  ? Target Date 02/18/22   ?  ? OT LONG TERM GOAL #7  ? Title Pt will demonstrate ability to retrieve a lightweight object at 120 shoulder flexion with pain less than or equal to 3/10.   ? Time 8   ? Period Weeks   ? Status On-going   ? Target Date 02/18/22   ?  ? OT LONG TERM  GOAL #8  ? Title Pt will increase RUE tip, and 3 pt pinch to 8 lbs or greater for increased functional use of RUE.   ? Baseline Tip 4 lbs, 3pt 4 lbs   ? Time 8   ? Period Weeks   ? Status On-going   ? Target Date 02/18/22

## 2022-01-24 ENCOUNTER — Telehealth: Payer: Self-pay | Admitting: Orthopaedic Surgery

## 2022-01-24 DIAGNOSIS — G8929 Other chronic pain: Secondary | ICD-10-CM

## 2022-01-24 NOTE — Telephone Encounter (Signed)
Lvm advising pt.

## 2022-01-24 NOTE — Telephone Encounter (Signed)
Per last note ok to order. Mri ordered  ?

## 2022-01-24 NOTE — Telephone Encounter (Signed)
Pt called requesting a referral for MRI. Pt states the injection did not work. Please call pt at [phone]. ?

## 2022-01-25 ENCOUNTER — Ambulatory Visit: Payer: Worker's Compensation | Admitting: Occupational Therapy

## 2022-01-25 DIAGNOSIS — M25531 Pain in right wrist: Secondary | ICD-10-CM

## 2022-01-25 DIAGNOSIS — M25631 Stiffness of right wrist, not elsewhere classified: Secondary | ICD-10-CM | POA: Diagnosis not present

## 2022-01-25 DIAGNOSIS — R278 Other lack of coordination: Secondary | ICD-10-CM

## 2022-01-25 DIAGNOSIS — M25611 Stiffness of right shoulder, not elsewhere classified: Secondary | ICD-10-CM

## 2022-01-25 DIAGNOSIS — M6281 Muscle weakness (generalized): Secondary | ICD-10-CM

## 2022-01-25 DIAGNOSIS — R600 Localized edema: Secondary | ICD-10-CM

## 2022-01-25 DIAGNOSIS — G8929 Other chronic pain: Secondary | ICD-10-CM

## 2022-01-27 ENCOUNTER — Ambulatory Visit: Payer: Worker's Compensation | Admitting: Occupational Therapy

## 2022-01-27 ENCOUNTER — Encounter: Payer: Self-pay | Admitting: Occupational Therapy

## 2022-01-27 DIAGNOSIS — M6281 Muscle weakness (generalized): Secondary | ICD-10-CM

## 2022-01-27 DIAGNOSIS — M25631 Stiffness of right wrist, not elsewhere classified: Secondary | ICD-10-CM | POA: Diagnosis not present

## 2022-01-27 DIAGNOSIS — R278 Other lack of coordination: Secondary | ICD-10-CM

## 2022-01-27 DIAGNOSIS — M25611 Stiffness of right shoulder, not elsewhere classified: Secondary | ICD-10-CM

## 2022-01-27 DIAGNOSIS — G8929 Other chronic pain: Secondary | ICD-10-CM

## 2022-01-27 DIAGNOSIS — M25531 Pain in right wrist: Secondary | ICD-10-CM

## 2022-01-27 DIAGNOSIS — R600 Localized edema: Secondary | ICD-10-CM

## 2022-01-27 NOTE — Therapy (Signed)
[facility], [place], [address] ?Phone: [phone]   Fax:  [phone] ? ?Occupational Therapy Treatment ? ?Patient Details  ?Name: Craig Church ?MRN: [id number] ?Date of Birth: 1966/03/01 ?Referring Provider (OT): Dr. Maryan Puls. [name] (Dr. Rush Farmer for shoulder, Dr. [name] for wrist) ? ? ?Encounter Date: 01/27/2022 ? ? OT End of Session - 01/27/22 1530   ? ? Visit Number 40   ? Number of Visits 45   ? Date for OT Re-Evaluation 02/17/22   ? Authorization Type workers comp additional 8 visits approved following the inital 16 visits 16+8=24, 8 additional visits approved 11/11/21   ? Authorization Time Period 8 additional visits approved on 12/22/21   ? Authorization - Visit Number 39   ? Authorization - Number of Visits 40   ? OT Start Time 1400   ? OT Stop Time 1525   ? OT Time Calculation (min) 85 min   ? Equipment Utilized During Treatment Hot pack, fluidotherapy   ? Activity Tolerance Patient tolerated treatment well   ? Behavior During Therapy [facility] for tasks assessed/performed   ? ?  ?  ? ?  ? ? ?Past Medical History:  ?Diagnosis Date  ? Hypertension   ? Seizure disorder ([place])   ? ? ?Past Surgical History:  ?Procedure Laterality Date  ? CHOLECYSTECTOMY    ? OPEN REDUCTION INTERNAL FIXATION (ORIF) DISTAL PHALANX Right 07/12/2021  ? Procedure: OPEN REDUCTION INTERNAL FIXATION (ORIF) DISTAL RADIUS;  Surgeon: Cindra Presume, MD;  Location: [facility];  Service: Plastics;  Laterality: Right;  ? ? ?There were no vitals filed for this visit. ? ? Subjective Assessment - 01/27/22 1408   ? ? Subjective  I can move it a little better (right shoulder) .  There are things I still cannot do - e.g. closing car door, reaching into upper cabinets   ? Pertinent History 56 yo male with onset of fall at work off a ladder was brought to [facility] on 9/23 and received ORIF for R distal radial fracture on 9/26.  Mildly displaced R ulnar styloid fracture not requiring sx.   Received TLSO for spinal injuries of T12 comp fx, T11 anterior wedging, B inferior articular process and spinous process fractures.  PMHx:  HA, colloid cyst of brain, idiopathic intracranial hypertension, LE edema, lumbar spondylosis and radiculopathy, chestpain, HLD, seizure disorder.   ? Limitations Back precuations from recent back surgery 12/03/21, no lifting over 15 lbs, continues tx for R shoulder and hand pain   ? Patient Stated Goals custom splint   ? Currently in Pain? Yes   ? Pain Score 2    ? Pain Location Shoulder   ? Pain Orientation Right   ? Pain Descriptors / Indicators Aching   ? Pain Type Acute pain   ? Pain Onset More than a month ago   ? Pain Frequency Constant   ? Aggravating Factors  spasm   ? Pain Relieving Factors rest, meds   ? Multiple Pain Sites Yes   ? Pain Score 2   ? Pain Location Hand   ? Pain Orientation Right   ? Pain Descriptors / Indicators Aching   ? Pain Type Chronic pain   ? Pain Onset More than a month ago   ? Pain Frequency Constant   ? Aggravating Factors  swelling   ? Pain Relieving Factors heat   ? ?  ?  ? ?  ? ? ? ? ? [facility] OT Assessment -  01/27/22 0001   ? ?  ? AROM  ? Right Shoulder Flexion 131 Degrees   ?  ? Right Hand PROM  ? R Index  MCP 0-90 85 Degrees   ? R Index PIP 0-100 95 Degrees   ? R Long  MCP 0-90 75 Degrees   ? R Long PIP 0-100 95 Degrees   ? R Ring  MCP 0-90 75 Degrees   ? R Ring PIP 0-100 95 Degrees   ? R Little  MCP 0-90 55 Degrees   ? R Little PIP 0-100 85 Degrees   ? ?  ?  ? ?  ? ? ? ? ? ? ? ? ? ? ? OT Treatments/Exercises (OP) - 01/27/22 0001   ? ?  ? ADLs  ? Cooking Worked to improve reach into upper cabinets.  Patient reports he tends to use his left arm.  Initially worked to slide up PVC pole to reduce weight of right arm.  Patient able to reach to eye level without pain, although higher induced pain.  Also educated patient to use left hand as prop to right hand to reach into cabinet with right.  Left hand cupping under elbow to reduce weight of arm.    ?  ? Neurological Re-education Exercises  ? Other Exercises 1 Working to improve mechanics of reach.  In reclined position - worked to drop humeral head prior to raising arm beyond~75*flexion.  Flexion improving.  Needs further emphasis on scaption/abduction.   ?  ? Moist Heat Therapy  ? Number Minutes Moist Heat 12 Minutes   ? Moist Heat Location Shoulder   ?  ? RUE Fluidotherapy  ? Number Minutes Fluidotherapy 12 Minutes   ? RUE Fluidotherapy Location Hand;Wrist;Forearm   ? Comments reduce pain, stiffness   ?  ? Manual Therapy  ? Manual Therapy Soft tissue mobilization   ? Joint Mobilization Digits MCP/PIP, mid carpal with gentle motion to promote increased flexion and extension.  Ended session with prolonged stretch of PIP/DIP DIGITS 2=4   ? Soft tissue mobilization upper trap, subscap region - to reduce tension, work thru adhesions.  Patient shown theracane to manage knots/discomfort in posterior shoulder.  Patient liked results.   ? ?  ?  ? ?  ? ? ? ? ? ? ? ? ? ? ? OT Short Term Goals - 01/27/22 1745   ? ?  ? OT SHORT TERM GOAL #1  ? Title I with splint wear, care and precautions   ? Status Achieved   ?  ? OT SHORT TERM GOAL #2  ? Title I with inital HEP   ? Status Achieved   ?  ? OT SHORT TERM GOAL #3  ? Title Pt will demonstrate ability to perfrom at least 90% composite finger flexion in prep for functional use.   ? Status Achieved   90%  ?  ? OT SHORT TERM GOAL #4  ? Title Pt will demonstrate wrist flexion/ extension of at least 40* in prep for functional use.   ? Period Weeks   ? Status Achieved   45*/ 40 extension  ?  ? OT SHORT TERM GOAL #5  ? Title I with edema control.   ? Status Achieved   ?  ? OT SHORT TERM GOAL #6  ? Title Pt will demonstrate 50* wrist flexion and 45* wrist extension in prep for functional use   ? Status Achieved   met 55 for flex, 50 for ext  ?  ?   OT SHORT TERM GOAL #7  ? Title Pt will demonstrate forearm supination/ pronation of 85* in prep for work activities   ? Status Achieved    met 85  ?  ? OT SHORT TERM GOAL #8  ? Title Pt will demonstrate grip strength of 23 lbs or greater in prep for functional use.   ? Status Achieved   11/16/21-30.7 lbs  ?  ? OT SHORT TERM GOAL  #9  ? TITLE Pt will report increased ease with the following tasks: cutting food, opening containers, and squeezing toothpaste. Revised goal Pt will report increased ease with cutting food and opening continers using RUE.   ? Time 4   ? Period Weeks   ? Status Revised   still difficulty cutting food and opening contianers, met for squeeze toothpaste  ? Target Date 01/21/22   ?  ? OT SHORT TERM GOAL  #10  ? TITLE Pt will be I with updated HEP to address right shoulder ROM   ? Time 4   ? Period Weeks   ? Status On-going   ? Target Date 01/21/22   ?  ? OT SHORT TERM GOAL  #11  ? TITLE Pt will demonstrate ability to retrieve a lightweight object at 110 shoulder flexion with pain no greater than 3/10   ? Time 4   ? Period Weeks   ? Status Achieved   ? Target Date 01/21/22   ?  ? OT SHORT TERM GOAL  #12  ? TITLE Pt will demonstrate 55* wrist extension for increased ease with ADLS.   ? Time 4   ? Period Weeks   ? Status On-going   ? Target Date 01/21/22   ?  ? OT SHORT TERM GOAL  #13  ? TITLE Pt will verbalize understanding of activity modification, and positioning for sleep to minimize shoulder pain   ? Time 4   ? Period Weeks   ? Status On-going   ? Target Date 01/21/22   ? ?  ?  ? ?  ? ? ? ? OT Long Term Goals - 01/27/22 1746   ? ?  ? OT LONG TERM GOAL #1  ? Title I with updated HEP.   ? Status On-going   Pt met updated HEP prior to surgery, however pt will benefit from continued updates  ? Target Date 02/18/22   ?  ? OT LONG TERM GOAL #2  ? Title Pt will demonstrate grip strength of at least 40 lbs in prep for work activities. Revised goal-Pt will demonstrate RUE grip strength of 45 lbs or greater for increased RUE functional use.   ? Time 8   ? Period Weeks   ? Status Revised   37.2  ? Target Date 02/18/22   ?  ? OT LONG TERM  GOAL #3  ? Title Pt will demonstrate wrist flexion/ extension WFLS for work activities.   ? Time 8   ? Period Weeks   ? Status On-going   ?  ? OT LONG TERM GOAL #4  ? Title Pt will demonstrate forear

## 2022-01-27 NOTE — Therapy (Signed)
[facility] ?[place] ?[place], [place], [address] ?Phone: [phone]   Fax:  [phone] ? ?Occupational Therapy Treatment ? ?Patient Details  ?Name: Craig Church ?MRN: [id number] ?Date of Birth: 15-Sep-1966 ?Referring Provider (OT): Dr. Maryan Puls. [name] (Dr. Ninfa Linden for shoulder, Dr. [name] for wrist) ? ? ?Encounter Date: 01/25/2022 ? ? OT End of Session - 01/27/22 0905   ? ? Visit Number 39   ? Number of Visits 45   ? Date for OT Re-Evaluation 02/17/22   ? Authorization Type workers comp additional 8 visits approved following the inital 16 visits 16+8=24, 8 additional visits approved 11/11/21   ? Authorization - Visit Number 38   ? Authorization - Number of Visits 40   ? OT Start Time 1448   ? OT Stop Time 1535   ? OT Time Calculation (min) 47 min   ? Activity Tolerance Patient tolerated treatment well   ? ?  ?  ? ?  ? ? ?Past Medical History:  ?Diagnosis Date  ? Hypertension   ? Seizure disorder ([place])   ? ? ?Past Surgical History:  ?Procedure Laterality Date  ? CHOLECYSTECTOMY    ? OPEN REDUCTION INTERNAL FIXATION (ORIF) DISTAL PHALANX Right 07/12/2021  ? Procedure: OPEN REDUCTION INTERNAL FIXATION (ORIF) DISTAL RADIUS;  Surgeon: Cindra Presume, MD;  Location: [facility];  Service: Plastics;  Laterality: Right;  ? ? ?There were no vitals filed for this visit. ? ? Subjective Assessment - 01/27/22 0903   ? ? Subjective  "It feels like it's jammed" regarding hand   ? Pertinent History 56 yo male with onset of fall at work off a ladder was brought to [facility] on 9/23 and received ORIF for R distal radial fracture on 9/26.  Mildly displaced R ulnar styloid fracture not requiring sx.  Received TLSO for spinal injuries of T12 comp fx, T11 anterior wedging, B inferior articular process and spinous process fractures.  PMHx:  HA, colloid cyst of brain, idiopathic intracranial hypertension, LE edema, lumbar spondylosis and radiculopathy, chestpain, HLD, seizure disorder.    ? Limitations Back precuations from recent back surgery 12/03/21, no lifting over 15 lbs, continues tx for R shoulder and hand pain   ? Patient Stated Goals custom splint   ? Currently in Pain? Yes   ? Pain Score 3    ? Pain Location Shoulder   hand  ? Pain Orientation Right   ? Pain Descriptors / Indicators Aching   ? Pain Type Acute pain   ? Pain Onset More than a month ago   ? Pain Frequency Constant   ? Aggravating Factors  spasm   ? Pain Relieving Factors rest, meds   ? ?  ?  ? ?  ? ? ? ? ? ? ? ? ? ?Treatment:Fluidotherapy x 10 mins to L hand while hotpack applied to left shoulder, no adverse reactions. ?Joint mobs to left hand and gentle passive stretch in finger flexion, A/ROM tendon gliding exercises. ?Pt reports hand is much looser following heat. ?Supine on wedge, RUE supported on pillows, joint mobs to right shoulder with focus on improved alignment , shoulder scapular retraction  ? ?Supine A/ROM shoulder and scapular reaction, AA/ROM shoulder flexion and shoulder circles overhead. ?Ice pack x 8 mins to right shoulder for pain relief. No adverse reactions ?Pt reports he is pain free at end of session. ? ? ? ? ? ? ? ? ? ? ? ? ? ? OT Short Term Goals -  01/20/22 1703   ? ?  ? OT SHORT TERM GOAL #1  ? Title I with splint wear, care and precautions   ? Status Achieved   ?  ? OT SHORT TERM GOAL #2  ? Title I with inital HEP   ? Status Achieved   ?  ? OT SHORT TERM GOAL #3  ? Title Pt will demonstrate ability to perfrom at least 90% composite finger flexion in prep for functional use.   ? Status Achieved   90%  ?  ? OT SHORT TERM GOAL #4  ? Title Pt will demonstrate wrist flexion/ extension of at least 40* in prep for functional use.   ? Period Weeks   ? Status Achieved   45*/ 40 extension  ?  ? OT SHORT TERM GOAL #5  ? Title I with edema control.   ? Status Achieved   ?  ? OT SHORT TERM GOAL #6  ? Title Pt will demonstrate 50* wrist flexion and 45* wrist extension in prep for functional use   ? Status Achieved    met 55 for flex, 50 for ext  ?  ? OT SHORT TERM GOAL #7  ? Title Pt will demonstrate forearm supination/ pronation of 85* in prep for work activities   ? Status Achieved   met 85  ?  ? OT SHORT TERM GOAL #8  ? Title Pt will demonstrate grip strength of 23 lbs or greater in prep for functional use.   ? Status Achieved   11/16/21-30.7 lbs  ?  ? OT SHORT TERM GOAL  #9  ? TITLE Pt will report increased ease with the following tasks: cutting food, opening containers, and squeezing toothpaste. Revised goal Pt will report increased ease with cutting food and opening continers using RUE.   ? Time 4   ? Period Weeks   ? Status Revised   still difficulty cutting food and opening contianers, met for squeeze toothpaste  ? Target Date 01/21/22   ?  ? OT SHORT TERM GOAL  #10  ? TITLE Pt will be I with updated HEP to address right shoulder ROM   ? Time 4   ? Period Weeks   ? Status On-going   ? Target Date 01/21/22   ?  ? OT SHORT TERM GOAL  #11  ? TITLE Pt will demonstrate ability to retrieve a lightweight object at 110 shoulder flexion with pain no greater than 3/10   ? Time 4   ? Period Weeks   ? Status On-going   ? Target Date 01/21/22   ?  ? OT SHORT TERM GOAL  #12  ? TITLE Pt will demonstrate 55* wrist extension for increased ease with ADLS.   ? Time 4   ? Period Weeks   ? Status On-going   ? Target Date 01/21/22   ?  ? OT SHORT TERM GOAL  #13  ? TITLE Pt will verbalize understanding of activity modification, and positioning for sleep to minimize shoulder pain   ? Time 4   ? Period Weeks   ? Status On-going   ? Target Date 01/21/22   ? ?  ?  ? ?  ? ? ? ? OT Long Term Goals - 01/20/22 1703   ? ?  ? OT LONG TERM GOAL #1  ? Title I with updated HEP.   ? Status On-going   Pt met updated HEP prior to surgery, however pt will benefit from continued updates  ?  Target Date 02/18/22   ?  ? OT LONG TERM GOAL #2  ? Title Pt will demonstrate grip strength of at least 40 lbs in prep for work activities. Revised goal-Pt will demonstrate  RUE grip strength of 45 lbs or greater for increased RUE functional use.   ? Time 8   ? Period Weeks   ? Status Revised   37.2  ? Target Date 02/18/22   ?  ? OT LONG TERM GOAL #3  ? Title Pt will demonstrate wrist flexion/ extension WFLS for work activities.   ? Time 8   ? Period Weeks   ? Status On-going   ?  ? OT LONG TERM GOAL #4  ? Title Pt will demonstrate forearm supination/ pronation of at least 85* in prep for work.   ? Status --   85  ?  ? OT LONG TERM GOAL #5  ? Title Pt will resume use of RUE at least 90% of the time for ADLS/ IADLS with pain no greater than 3/10.   ? Time 8   ? Period Weeks   ? Status On-going   uses 75%, with pain 5-6/10 at worst  ? Target Date 02/18/22   ?  ? OT LONG TERM GOAL #6  ? Title Pt will perform simulated work activities modified independently   ? Status --   unable to perfrom due to pain and back prec.  ? Target Date 02/18/22   ?  ? OT LONG TERM GOAL #7  ? Title Pt will demonstrate ability to retrieve a lightweight object at 120 shoulder flexion with pain less than or equal to 3/10.   ? Time 8   ? Period Weeks   ? Status On-going   ? Target Date 02/18/22   ?  ? OT LONG TERM GOAL #8  ? Title Pt will increase RUE tip, and 3 pt pinch to 8 lbs or greater for increased functional use of RUE.   ? Baseline Tip 4 lbs, 3pt 4 lbs   ? Time 8   ? Period Weeks   ? Status On-going   ? Target Date 02/18/22   ? ?  ?  ? ?  ? ? ? ? ? ? ? ? Plan - 01/27/22 0911   ? ? Clinical Impression Statement Pt frustrated regarding state of hand - continues to experience swelling and stiffness, and has limited grip strength.  Pt. reports that CM is going to assist him with a second opinion for hand and an MRI for shoulder.   ? OT Occupational Profile and History Detailed Assessment- Review of Records and additional review of physical, cognitive, psychosocial history related to current functional performance   ? Occupational performance deficits (Please refer to evaluation for details):  ADL's;IADL's;Work;Social Participation;Rest and Sleep;Leisure   ? Body Structure / Function / Physical Skills ADL;UE functional use;Endurance;Balance;Flexibility;Pain;FMC;ROM;Coordination;GMC;Decreased knowledge of precau

## 2022-01-27 NOTE — Therapy (Signed)
[facility] ?[place] ?[place], [place], [address] ?Phone: [phone]   Fax:  [phone] ? ?Occupational Therapy Treatment and Progress Update ? ?Patient Details  ?Name: Craig Church ?MRN: [id number] ?Date of Birth: Feb 26, 1966 ?Referring Provider (OT): Dr. Maryan Puls. [name] (Dr. Rush Farmer for shoulder, Dr. [name] for wrist) ? ? ?Encounter Date: 01/27/2022 ? ? OT End of Session - 01/27/22 1530   ? ? Visit Number 40   ? Number of Visits 45   ? Date for OT Re-Evaluation 02/17/22   ? Authorization Type workers comp additional 8 visits approved following the inital 16 visits 16+8=24, 8 additional visits approved 11/11/21   ? Authorization Time Period 8 additional visits approved on 12/22/21   ? Authorization - Visit Number 39   ? Authorization - Number of Visits 40   ? OT Start Time 1400   ? OT Stop Time 1525   ? OT Time Calculation (min) 85 min   ? Equipment Utilized During Treatment Hot pack, fluidotherapy   ? Activity Tolerance Patient tolerated treatment well   ? Behavior During Therapy [facility] for tasks assessed/performed   ? ?  ?  ? ?  ? ? ?Past Medical History:  ?Diagnosis Date  ? Hypertension   ? Seizure disorder ([place])   ? ? ?Past Surgical History:  ?Procedure Laterality Date  ? CHOLECYSTECTOMY    ? OPEN REDUCTION INTERNAL FIXATION (ORIF) DISTAL PHALANX Right 07/12/2021  ? Procedure: OPEN REDUCTION INTERNAL FIXATION (ORIF) DISTAL RADIUS;  Surgeon: Cindra Presume, MD;  Location: [facility];  Service: Plastics;  Laterality: Right;  ? ? ?There were no vitals filed for this visit. ? ? Subjective Assessment - 01/27/22 1408   ? ? Subjective  I can move it a little better (right shoulder) .  There are things I still cannot do - e.g. closing car door, reaching into upper cabinets   ? Pertinent History 56 yo male with onset of fall at work off a ladder was brought to [facility] on 9/23 and received ORIF for R distal radial fracture on 9/26.  Mildly displaced R ulnar styloid  fracture not requiring sx.  Received TLSO for spinal injuries of T12 comp fx, T11 anterior wedging, B inferior articular process and spinous process fractures.  PMHx:  HA, colloid cyst of brain, idiopathic intracranial hypertension, LE edema, lumbar spondylosis and radiculopathy, chestpain, HLD, seizure disorder.   ? Limitations Back precuations from recent back surgery 12/03/21, no lifting over 15 lbs, continues tx for R shoulder and hand pain   ? Patient Stated Goals custom splint   ? Currently in Pain? Yes   ? Pain Score 2    ? Pain Location Shoulder   ? Pain Orientation Right   ? Pain Descriptors / Indicators Aching   ? Pain Type Acute pain   ? Pain Onset More than a month ago   ? Pain Frequency Constant   ? Aggravating Factors  spasm   ? Pain Relieving Factors rest, meds   ? Multiple Pain Sites Yes   ? Pain Score 2   ? Pain Location Hand   ? Pain Orientation Right   ? Pain Descriptors / Indicators Aching   ? Pain Type Chronic pain   ? Pain Onset More than a month ago   ? Pain Frequency Constant   ? Aggravating Factors  swelling   ? Pain Relieving Factors heat   ? ?  ?  ? ?  ? ? ? ? ?  [facility] OT Assessment - 01/27/22 0001   ? ?  ? AROM  ? Right Shoulder Flexion 131 Degrees   ?  ? Right Hand PROM  ? R Index  MCP 0-90 85 Degrees   ? R Index PIP 0-100 95 Degrees   ? R Long  MCP 0-90 75 Degrees   ? R Long PIP 0-100 95 Degrees   ? R Ring  MCP 0-90 75 Degrees   ? R Ring PIP 0-100 95 Degrees   ? R Little  MCP 0-90 55 Degrees   ? R Little PIP 0-100 85 Degrees   ? ?  ?  ? ?  ? ? ? ? ? ? ? ? ? ? ? OT Treatments/Exercises (OP) - 01/27/22 0001   ? ?  ? ADLs  ? Cooking Worked to improve reach into upper cabinets.  Patient reports he tends to use his left arm.  Initially worked to slide up PVC pole to reduce weight of right arm.  Patient able to reach to eye level without pain, although higher induced pain.  Also educated patient to use left hand as prop to right hand to reach into cabinet with right.  Left hand cupping under  elbow to reduce weight of arm.   ?  ? Neurological Re-education Exercises  ? Other Exercises 1 Working to improve mechanics of reach.  In reclined position - worked to drop humeral head prior to raising arm beyond~75*flexion.  Flexion improving.  Needs further emphasis on scaption/abduction.   ?  ? Moist Heat Therapy  ? Number Minutes Moist Heat 12 Minutes   ? Moist Heat Location Shoulder   ?  ? RUE Fluidotherapy  ? Number Minutes Fluidotherapy 12 Minutes   ? RUE Fluidotherapy Location Hand;Wrist;Forearm   ? Comments reduce pain, stiffness   ?  ? Manual Therapy  ? Manual Therapy Soft tissue mobilization   ? Joint Mobilization Digits MCP/PIP, mid carpal with gentle motion to promote increased flexion and extension.  Ended session with prolonged stretch of PIP/DIP DIGITS 2=4   ? Soft tissue mobilization upper trap, subscap region - to reduce tension, work thru adhesions.  Patient shown theracane to manage knots/discomfort in posterior shoulder.  Patient liked results.   ? ?  ?  ? ?  ? ? ? ? ? ? ? ? ? ? ? OT Short Term Goals - 01/27/22 1745   ? ?  ? OT SHORT TERM GOAL #1  ? Title I with splint wear, care and precautions   ? Status Achieved   ?  ? OT SHORT TERM GOAL #2  ? Title I with inital HEP   ? Status Achieved   ?  ? OT SHORT TERM GOAL #3  ? Title Pt will demonstrate ability to perfrom at least 90% composite finger flexion in prep for functional use.   ? Status Achieved   90%  ?  ? OT SHORT TERM GOAL #4  ? Title Pt will demonstrate wrist flexion/ extension of at least 40* in prep for functional use.   ? Period Weeks   ? Status Achieved   45*/ 40 extension  ?  ? OT SHORT TERM GOAL #5  ? Title I with edema control.   ? Status Achieved   ?  ? OT SHORT TERM GOAL #6  ? Title Pt will demonstrate 50* wrist flexion and 45* wrist extension in prep for functional use   ? Status Achieved   met 55 for flex, 50  for ext  ?  ? OT SHORT TERM GOAL #7  ? Title Pt will demonstrate forearm supination/ pronation of 85* in prep for work  activities   ? Status Achieved   met 85  ?  ? OT SHORT TERM GOAL #8  ? Title Pt will demonstrate grip strength of 23 lbs or greater in prep for functional use.   ? Status Achieved   11/16/21-30.7 lbs  ?  ? OT SHORT TERM GOAL  #9  ? TITLE Pt will report increased ease with the following tasks: cutting food, opening containers, and squeezing toothpaste. Revised goal Pt will report increased ease with cutting food and opening continers using RUE.   ? Time 4   ? Period Weeks   ? Status Revised   still difficulty cutting food and opening contianers, met for squeeze toothpaste  ? Target Date 01/21/22   ?  ? OT SHORT TERM GOAL  #10  ? TITLE Pt will be I with updated HEP to address right shoulder ROM   ? Time 4   ? Period Weeks   ? Status On-going   ? Target Date 01/21/22   ?  ? OT SHORT TERM GOAL  #11  ? TITLE Pt will demonstrate ability to retrieve a lightweight object at 110 shoulder flexion with pain no greater than 3/10   ? Time 4   ? Period Weeks   ? Status Achieved   ? Target Date 01/21/22   ?  ? OT SHORT TERM GOAL  #12  ? TITLE Pt will demonstrate 55* wrist extension for increased ease with ADLS.   ? Time 4   ? Period Weeks   ? Status On-going   ? Target Date 01/21/22   ?  ? OT SHORT TERM GOAL  #13  ? TITLE Pt will verbalize understanding of activity modification, and positioning for sleep to minimize shoulder pain   ? Time 4   ? Period Weeks   ? Status On-going   ? Target Date 01/21/22   ? ?  ?  ? ?  ? ? ? ? OT Long Term Goals - 01/27/22 1746   ? ?  ? OT LONG TERM GOAL #1  ? Title I with updated HEP.   ? Status On-going   Pt met updated HEP prior to surgery, however pt will benefit from continued updates  ? Target Date 02/18/22   ?  ? OT LONG TERM GOAL #2  ? Title Pt will demonstrate grip strength of at least 40 lbs in prep for work activities. Revised goal-Pt will demonstrate RUE grip strength of 45 lbs or greater for increased RUE functional use.   ? Time 8   ? Period Weeks   ? Status Revised   37.2  ? Target Date  02/18/22   ?  ? OT LONG TERM GOAL #3  ? Title Pt will demonstrate wrist flexion/ extension WFLS for work activities.   ? Time 8   ? Period Weeks   ? Status On-going   ?  ? OT LONG TERM GOAL #4  ? Title Pt wil

## 2022-02-01 ENCOUNTER — Encounter: Payer: Self-pay | Admitting: Occupational Therapy

## 2022-02-01 ENCOUNTER — Ambulatory Visit: Payer: Worker's Compensation | Admitting: Occupational Therapy

## 2022-02-01 DIAGNOSIS — G8929 Other chronic pain: Secondary | ICD-10-CM

## 2022-02-01 DIAGNOSIS — M25611 Stiffness of right shoulder, not elsewhere classified: Secondary | ICD-10-CM

## 2022-02-01 DIAGNOSIS — M25631 Stiffness of right wrist, not elsewhere classified: Secondary | ICD-10-CM | POA: Diagnosis not present

## 2022-02-01 DIAGNOSIS — M25531 Pain in right wrist: Secondary | ICD-10-CM

## 2022-02-01 DIAGNOSIS — M6281 Muscle weakness (generalized): Secondary | ICD-10-CM

## 2022-02-01 NOTE — Therapy (Signed)
[place] [facility], [place], [address] ?Phone: [phone]   Fax:  [phone] ? ?Occupational Therapy Treatment ? ?Patient Details  ?Name: Craig Church ?MRN: [id number] ?Date of Birth: 1966-03-16 ?Referring Provider (OT): Dr. Maryan Puls. [name] (Dr. Rush Farmer for shoulder, Dr. [name] for wrist) ? ? ?Encounter Date: 02/01/2022 ? ? OT End of Session - 02/01/22 1246   ? ? Visit Number 41   ? Number of Visits 45   ? Date for OT Re-Evaluation 02/17/22   ? Authorization Type workers comp additional 8 visits approved following the inital 16 visits 16+8=24, 8 additional visits approved 11/11/21, additional 14 visits approved - 01/31/22   ? Authorization Time Period 14 additional visits approved on 01/31/22   ? Authorization - Visit Number 40   ? Authorization - Number of Visits 54   ? OT Start Time 1235   ? OT Stop Time 1345   ? OT Time Calculation (min) 70 min   ? Equipment Utilized During Treatment Hot pack, fluidotherapy   ? Activity Tolerance Patient tolerated treatment well   ? Behavior During Therapy [facility] for tasks assessed/performed   ? ?  ?  ? ?  ? ? ?Past Medical History:  ?Diagnosis Date  ? Hypertension   ? Seizure disorder ([place])   ? ? ?Past Surgical History:  ?Procedure Laterality Date  ? CHOLECYSTECTOMY    ? OPEN REDUCTION INTERNAL FIXATION (ORIF) DISTAL PHALANX Right 07/12/2021  ? Procedure: OPEN REDUCTION INTERNAL FIXATION (ORIF) DISTAL RADIUS;  Surgeon: Cindra Presume, MD;  Location: [facility];  Service: Plastics;  Laterality: Right;  ? ? ?There were no vitals filed for this visit. ? ? Subjective Assessment - 02/01/22 1244   ? ? Subjective  I just had the MRI done on lower back, and I get another MRI for my Rt shoulder on 02/12/22.   ? Pertinent History 56 yo male with onset of fall at work off a ladder was brought to [place] on 9/23 and received ORIF for R distal radial fracture on 9/26.  Mildly displaced R ulnar styloid fracture not requiring sx.   Received TLSO for spinal injuries of T12 comp fx, T11 anterior wedging, B inferior articular process and spinous process fractures.  PMHx:  HA, colloid cyst of brain, idiopathic intracranial hypertension, LE edema, lumbar spondylosis and radiculopathy, chestpain, HLD, seizure disorder.   ? Limitations Back precuations from recent back surgery 12/03/21, no lifting over 15 lbs, continues tx for R shoulder and hand pain   ? Patient Stated Goals custom splint   ? Currently in Pain? Yes   ? Pain Score 4    ? Pain Location Shoulder   2/10 Rt hand  ? Pain Orientation Right   ? Pain Descriptors / Indicators Aching   ? Pain Type Acute pain   ? Pain Onset More than a month ago   ? Pain Frequency Constant   ? Aggravating Factors  spasm   ? Pain Relieving Factors rest, heat, meds   ? Pain Onset More than a month ago   ? ?  ?  ? ?  ? ?Fluidotherapy x 12 minutes Rt hand w/ hot pack to Rt shoulder while in fluido.  ?Joint mobs and P/ROM to Rt hand and wrist ? ?Supine (elevated w/ wedge) - focused on scapula/sh girdle retraction and depression while reaching towards toes. Followed by AA/ROM in BUE shoulder flexion however modified beginning and ending movements with elbows flexed - pt able to achieve  higher flexion, and going back into neutral sh extension with less pain (2/10) ? ?Worked on low range scaption/abduction movements first in AA/ROM then in A/ROM with focus on ribcage, trunk and scapula disengaging for proper reaching pattern (vs. Activating shoulder) ? ?Pt reports difficulty donning socks and closing car door. Problem solved - pt reports difficulty using crossing leg method for donning socks d/t stiffness. Pt was shown LE dressing kit. Pt also shown use of dressing stick to assist w/ closing car door. Provided handouts and told where to purchase ? ? ? ? ? ? ? ? ? ? ? ? ? ? ? ? ? ? ? ? ? ? ? ? OT Short Term Goals - 01/27/22 1745   ? ?  ? OT SHORT TERM GOAL #1  ? Title I with splint wear, care and precautions   ? Status  Achieved   ?  ? OT SHORT TERM GOAL #2  ? Title I with inital HEP   ? Status Achieved   ?  ? OT SHORT TERM GOAL #3  ? Title Pt will demonstrate ability to perfrom at least 90% composite finger flexion in prep for functional use.   ? Status Achieved   90%  ?  ? OT SHORT TERM GOAL #4  ? Title Pt will demonstrate wrist flexion/ extension of at least 40* in prep for functional use.   ? Period Weeks   ? Status Achieved   45*/ 40 extension  ?  ? OT SHORT TERM GOAL #5  ? Title I with edema control.   ? Status Achieved   ?  ? OT SHORT TERM GOAL #6  ? Title Pt will demonstrate 50* wrist flexion and 45* wrist extension in prep for functional use   ? Status Achieved   met 55 for flex, 50 for ext  ?  ? OT SHORT TERM GOAL #7  ? Title Pt will demonstrate forearm supination/ pronation of 85* in prep for work activities   ? Status Achieved   met 85  ?  ? OT SHORT TERM GOAL #8  ? Title Pt will demonstrate grip strength of 23 lbs or greater in prep for functional use.   ? Status Achieved   11/16/21-30.7 lbs  ?  ? OT SHORT TERM GOAL  #9  ? TITLE Pt will report increased ease with the following tasks: cutting food, opening containers, and squeezing toothpaste. Revised goal Pt will report increased ease with cutting food and opening continers using RUE.   ? Time 4   ? Period Weeks   ? Status Revised   still difficulty cutting food and opening contianers, met for squeeze toothpaste  ? Target Date 01/21/22   ?  ? OT SHORT TERM GOAL  #10  ? TITLE Pt will be I with updated HEP to address right shoulder ROM   ? Time 4   ? Period Weeks   ? Status On-going   ? Target Date 01/21/22   ?  ? OT SHORT TERM GOAL  #11  ? TITLE Pt will demonstrate ability to retrieve a lightweight object at 110 shoulder flexion with pain no greater than 3/10   ? Time 4   ? Period Weeks   ? Status Achieved   ? Target Date 01/21/22   ?  ? OT SHORT TERM GOAL  #12  ? TITLE Pt will demonstrate 55* wrist extension for increased ease with ADLS.   ? Time 4   ? Period Weeks   ?  Status On-going   ? Target Date 01/21/22   ?  ? OT SHORT TERM GOAL  #13  ? TITLE Pt will verbalize understanding of activity modification, and positioning for sleep to minimize shoulder pain   ? Time 4   ? Period Weeks   ? Status On-going   ? Target Date 01/21/22   ? ?  ?  ? ?  ? ? ? ? OT Long Term Goals - 01/27/22 1746   ? ?  ? OT LONG TERM GOAL #1  ? Title I with updated HEP.   ? Status On-going   Pt met updated HEP prior to surgery, however pt will benefit from continued updates  ? Target Date 02/18/22   ?  ? OT LONG TERM GOAL #2  ? Title Pt will demonstrate grip strength of at least 40 lbs in prep for work activities. Revised goal-Pt will demonstrate RUE grip strength of 45 lbs or greater for increased RUE functional use.   ? Time 8   ? Period Weeks   ? Status Revised   37.2  ? Target Date 02/18/22   ?  ? OT LONG TERM GOAL #3  ? Title Pt will demonstrate wrist flexion/ extension WFLS for work activities.   ? Time 8   ? Period Weeks   ? Status On-going   ?  ? OT LONG TERM GOAL #4  ? Title Pt will demonstrate forearm supination/ pronation of at least 85* in prep for work.   ? Status --   85  ?  ? OT LONG TERM GOAL #5  ? Title Pt will resume use of RUE at least 90% of the time for ADLS/ IADLS with pain no greater than 3/10.   ? Time 8   ? Period Weeks   ? Status On-going   uses 75%, with pain 5-6/10 at worst  ? Target Date 02/18/22   ?  ? OT LONG TERM GOAL #6  ? Title Pt will perform simulated work activities modified independently   ? Status --   unable to perfrom due to pain and back prec.  ? Target Date 02/18/22   ?  ? OT LONG TERM GOAL #7  ? Title Pt will demonstrate ability to retrieve a lightweight object at 120 shoulder flexion with pain less than or equal to 3/10.   ? Time 8   ? Period Weeks   ? Status On-going   ? Target Date 02/18/22   ?  ? OT LONG TERM GOAL #8  ? Title Pt will increase RUE tip, and 3 pt pinch to 8 lbs or greater for increased functional use of RUE.   ? Baseline Tip 4 lbs, 3pt 4 lbs   ?  Time 8   ? Period Weeks   ? Status On-going   ? Target Date 02/18/22   ? ?  ?  ? ?  ? ? ? ? ? ? ? ? Plan - 02/01/22 1358   ? ? Clinical Impression Statement Patient showing some improved range of motion in

## 2022-02-03 ENCOUNTER — Ambulatory Visit: Payer: Worker's Compensation | Admitting: Occupational Therapy

## 2022-02-03 DIAGNOSIS — R600 Localized edema: Secondary | ICD-10-CM

## 2022-02-03 DIAGNOSIS — R278 Other lack of coordination: Secondary | ICD-10-CM

## 2022-02-03 DIAGNOSIS — M25611 Stiffness of right shoulder, not elsewhere classified: Secondary | ICD-10-CM

## 2022-02-03 DIAGNOSIS — R2689 Other abnormalities of gait and mobility: Secondary | ICD-10-CM

## 2022-02-03 DIAGNOSIS — M25531 Pain in right wrist: Secondary | ICD-10-CM

## 2022-02-03 DIAGNOSIS — M25631 Stiffness of right wrist, not elsewhere classified: Secondary | ICD-10-CM

## 2022-02-03 DIAGNOSIS — G8929 Other chronic pain: Secondary | ICD-10-CM

## 2022-02-03 DIAGNOSIS — M6281 Muscle weakness (generalized): Secondary | ICD-10-CM

## 2022-02-03 NOTE — Therapy (Signed)
[facility] ?[place], [place], [address] ?Phone: [phone]   Fax:  [phone] ? ?Occupational Therapy Treatment ? ?Patient Details  ?Name: Craig Church ?MRN: [id number] ?Date of Birth: 1966-06-25 ?Referring Provider (OT): Dr. Alexande Lagos. [name] (Dr. Ninfa Linden for shoulder, Dr. [name] for wrist) ? ? ?Encounter Date: 02/03/2022 ? ? OT End of Session - 02/03/22 1611   ? ? Visit Number 42   ? Number of Visits 45   ? Date for OT Re-Evaluation 02/17/22   ? Authorization Type workers comp additional 8 visits approved following the inital 16 visits 16+8=24, 8 additional visits approved 11/11/21, additional 14 visits approved - 01/31/22   ? Authorization Time Period 14 additional visits approved on 01/31/22   ? Authorization - Visit Number 41   ? Authorization - Number of Visits 54   ? OT Start Time 3818   ? OT Stop Time 2993   ? OT Time Calculation (min) 70 min   ? Equipment Utilized During Treatment Hot pack, fluidotherapy   ? Activity Tolerance Patient tolerated treatment well   ? Behavior During Therapy [facility] for tasks assessed/performed   ? ?  ?  ? ?  ? ? ?Past Medical History:  ?Diagnosis Date  ? Hypertension   ? Seizure disorder ([place])   ? ? ?Past Surgical History:  ?Procedure Laterality Date  ? CHOLECYSTECTOMY    ? OPEN REDUCTION INTERNAL FIXATION (ORIF) DISTAL PHALANX Right 07/12/2021  ? Procedure: OPEN REDUCTION INTERNAL FIXATION (ORIF) DISTAL RADIUS;  Surgeon: Cindra Presume, MD;  Location: [facility];  Service: Plastics;  Laterality: Right;  ? ? ?There were no vitals filed for this visit. ? ? Subjective Assessment - 02/03/22 1606   ? ? Subjective  I just had the MRI done on lower back, and I get another MRI for my Rt shoulder on 02/12/22.   ? Pertinent History 56 yo male with onset of fall at work off a ladder was brought to [place] on 9/23 and received ORIF for R distal radial fracture on 9/26.  Mildly displaced R ulnar styloid fracture not requiring sx.   Received TLSO for spinal injuries of T12 comp fx, T11 anterior wedging, B inferior articular process and spinous process fractures.  PMHx:  HA, colloid cyst of brain, idiopathic intracranial hypertension, LE edema, lumbar spondylosis and radiculopathy, chestpain, HLD, seizure disorder.   ? Limitations Back precuations from recent back surgery 12/03/21, no lifting over 15 lbs, continues tx for R shoulder and hand pain   ? Patient Stated Goals custom splint   ? Currently in Pain? Yes   ? Pain Score 4    ? Pain Location Back   ? Pain Orientation Right   ? Pain Descriptors / Indicators Aching   ? Pain Type Acute pain   ? Pain Onset More than a month ago   ? Pain Frequency Constant   ? Aggravating Factors  malpositioning   ? Pain Relieving Factors rest, heat meds   ? Multiple Pain Sites Yes   ? Pain Score 2   ? Pain Location Hand   ? Pain Orientation Right   ? Pain Descriptors / Indicators Aching   ? Pain Type Chronic pain   ? Pain Onset More than a month ago   ? Pain Frequency Intermittent   ? Aggravating Factors  swelling   ? Pain Relieving Factors heat   ? ?  ?  ? ?  ? ? ? ? ? ? ?Fluidotherapy x  10 mins to L hand while hotpack applied to left shoulder, no adverse reactions. ?Joint mobs to left hand and gentle passive stretch in finger flexion,  ?Kinesiotape applied to hand and wrist for edema control, pt verbalizes understanding of precautions, wear and care . ?Supine on plinth, with head elevated, RUE supported on pillows, joint mobs to right shoulder with focus on improved alignment ,  ?Supine A/ROM shoulder scapular retraction and internal external rotation AA/ROM shoulder flexion and shoulder circles overhead. Pt reports no shoulder pain end of session. ?AA/ROM shoulder flexion, circumduction and abduction with UE ranger  ?Pt practiced donning sock with sock aide following instruction. ? ? ? ? ? ? ? ? ? ? ? ? ? ? ? ? ? ? ? OT Short Term Goals - 01/27/22 1745   ? ?  ? OT SHORT TERM GOAL #1  ? Title I with splint  wear, care and precautions   ? Status Achieved   ?  ? OT SHORT TERM GOAL #2  ? Title I with inital HEP   ? Status Achieved   ?  ? OT SHORT TERM GOAL #3  ? Title Pt will demonstrate ability to perfrom at least 90% composite finger flexion in prep for functional use.   ? Status Achieved   90%  ?  ? OT SHORT TERM GOAL #4  ? Title Pt will demonstrate wrist flexion/ extension of at least 40* in prep for functional use.   ? Period Weeks   ? Status Achieved   45*/ 40 extension  ?  ? OT SHORT TERM GOAL #5  ? Title I with edema control.   ? Status Achieved   ?  ? OT SHORT TERM GOAL #6  ? Title Pt will demonstrate 50* wrist flexion and 45* wrist extension in prep for functional use   ? Status Achieved   met 55 for flex, 50 for ext  ?  ? OT SHORT TERM GOAL #7  ? Title Pt will demonstrate forearm supination/ pronation of 85* in prep for work activities   ? Status Achieved   met 85  ?  ? OT SHORT TERM GOAL #8  ? Title Pt will demonstrate grip strength of 23 lbs or greater in prep for functional use.   ? Status Achieved   11/16/21-30.7 lbs  ?  ? OT SHORT TERM GOAL  #9  ? TITLE Pt will report increased ease with the following tasks: cutting food, opening containers, and squeezing toothpaste. Revised goal Pt will report increased ease with cutting food and opening continers using RUE.   ? Time 4   ? Period Weeks   ? Status Revised   still difficulty cutting food and opening contianers, met for squeeze toothpaste  ? Target Date 01/21/22   ?  ? OT SHORT TERM GOAL  #10  ? TITLE Pt will be I with updated HEP to address right shoulder ROM   ? Time 4   ? Period Weeks   ? Status On-going   ? Target Date 01/21/22   ?  ? OT SHORT TERM GOAL  #11  ? TITLE Pt will demonstrate ability to retrieve a lightweight object at 110 shoulder flexion with pain no greater than 3/10   ? Time 4   ? Period Weeks   ? Status Achieved   ? Target Date 01/21/22   ?  ? OT SHORT TERM GOAL  #12  ? TITLE Pt will demonstrate 55* wrist extension for increased ease with  ADLS.   ? Time 4   ? Period Weeks   ? Status On-going   ? Target Date 01/21/22   ?  ? OT SHORT TERM GOAL  #13  ? TITLE Pt will verbalize understanding of activity modification, and positioning for sleep to minimize shoulder pain   ? Time 4   ? Period Weeks   ? Status On-going   ? Target Date 01/21/22   ? ?  ?  ? ?  ? ? ? ? OT Long Term Goals - 01/27/22 1746   ? ?  ? OT LONG TERM GOAL #1  ? Title I with updated HEP.   ? Status On-going   Pt met updated HEP prior to surgery, however pt will benefit from continued updates  ? Target Date 02/18/22   ?  ? OT LONG TERM GOAL #2  ? Title Pt will demonstrate grip strength of at least 40 lbs in prep for work activities. Revised goal-Pt will demonstrate RUE grip strength of 45 lbs or greater for increased RUE functional use.   ? Time 8   ? Period Weeks   ? Status Revised   37.2  ? Target Date 02/18/22   ?  ? OT LONG TERM GOAL #3  ? Title Pt will demonstrate wrist flexion/ extension WFLS for work activities.   ? Time 8   ? Period Weeks   ? Status On-going   ?  ? OT LONG TERM GOAL #4  ? Title Pt will demonstrate forearm supination/ pronation of at least 85* in prep for work.   ? Status --   85  ?  ? OT LONG TERM GOAL #5  ? Title Pt will resume use of RUE at least 90% of the time for ADLS/ IADLS with pain no greater than 3/10.   ? Time 8   ? Period Weeks   ? Status On-going   uses 75%, with pain 5-6/10 at worst  ? Target Date 02/18/22   ?  ? OT LONG TERM GOAL #6  ? Title Pt will perform simulated work activities modified independently   ? Status --   unable to perfrom due to pain and back prec.  ? Target Date 02/18/22   ?  ? OT LONG TERM GOAL #7  ? Title Pt will demonstrate ability to retrieve a lightweight object at 120 shoulder flexion with pain less than or equal to 3/10.   ? Time 8   ? Period Weeks   ? Status On-going   ? Target Date 02/18/22   ?  ? OT LONG TERM GOAL #8  ? Title Pt will increase RUE tip, and 3 pt pinch to 8 lbs or greater for increased functional use of RUE.    ? Baseline Tip 4 lbs, 3pt 4 lbs   ? Time 8   ? Period Weeks   ? Status On-going   ? Target Date 02/18/22   ? ?  ?  ? ?  ? ? ? ? ? ? ? ? Plan - 02/03/22 1609   ? ? Clinical Impression Statement Pt is progre

## 2022-02-08 ENCOUNTER — Ambulatory Visit: Payer: Worker's Compensation | Admitting: Occupational Therapy

## 2022-02-10 ENCOUNTER — Ambulatory Visit: Payer: Worker's Compensation | Admitting: Occupational Therapy

## 2022-02-10 ENCOUNTER — Encounter: Payer: Self-pay | Admitting: Occupational Therapy

## 2022-02-10 DIAGNOSIS — R278 Other lack of coordination: Secondary | ICD-10-CM

## 2022-02-10 DIAGNOSIS — M6281 Muscle weakness (generalized): Secondary | ICD-10-CM

## 2022-02-10 DIAGNOSIS — M25611 Stiffness of right shoulder, not elsewhere classified: Secondary | ICD-10-CM

## 2022-02-10 DIAGNOSIS — M25631 Stiffness of right wrist, not elsewhere classified: Secondary | ICD-10-CM | POA: Diagnosis not present

## 2022-02-10 DIAGNOSIS — M25531 Pain in right wrist: Secondary | ICD-10-CM

## 2022-02-10 DIAGNOSIS — G8929 Other chronic pain: Secondary | ICD-10-CM

## 2022-02-10 NOTE — Patient Instructions (Signed)
?**  BE CAREFUL GETTING IN AND OUT OF THESE POSITIONS AND DO NOT BREAK BACK PRECAUTIONS. IF BACK HURTS - STOP DOING IMMEDIATELY** ? ?Scapular Retraction (Prone) ? ? ? ?Lie with arms at sides. Pinch shoulder blades together and raise shoulders up towards ceiling (NOT ears). ?Repeat _10___ times per set.  Do __2__ sessions per day. ? ? ? ?Extension - Prone (Dumbbell) ? ? ? ?Lie with right arm hanging off side of bed. Lift hand back and up. ?Repeat __10__ times per set. Do __2__ sets per day. ? ? ?

## 2022-02-10 NOTE — Therapy (Signed)
[facility] ?[place], [place], [address] ?Phone: [phone]   Fax:  [phone] ? ?Occupational Therapy Treatment ? ?Patient Details  ?Name: Craig Church ?MRN: [id number] ?Date of Birth: 1965-12-29 ?Referring Provider (OT): Dr. Maryan Puls. [name] (Dr. Rush Farmer for shoulder, Dr. [name] for wrist) ? ? ?Encounter Date: 02/10/2022 ? ? OT End of Session - 02/10/22 1321   ? ? Visit Number 1   ? Number of Visits 45   ? Date for OT Re-Evaluation 02/17/22   ? Authorization Type workers comp additional 8 visits approved following the inital 16 visits 16+8=24, 8 additional visits approved 11/11/21, additional 14 visits approved - 01/31/22   ? Authorization Time Period 14 additional visits approved on 01/31/22   ? Authorization - Visit Number 42   ? Authorization - Number of Visits 54   ? OT Start Time 1315   ? OT Stop Time 1425   ? OT Time Calculation (min) 70 min   ? Equipment Utilized During Treatment Hot pack, fluidotherapy   ? Activity Tolerance Patient tolerated treatment well   ? Behavior During Therapy [facility] for tasks assessed/performed   ? ?  ?  ? ?  ? ? ?Past Medical History:  ?Diagnosis Date  ? Hypertension   ? Seizure disorder ([place])   ? ? ?Past Surgical History:  ?Procedure Laterality Date  ? CHOLECYSTECTOMY    ? OPEN REDUCTION INTERNAL FIXATION (ORIF) DISTAL PHALANX Right 07/12/2021  ? Procedure: OPEN REDUCTION INTERNAL FIXATION (ORIF) DISTAL RADIUS;  Surgeon: Cindra Presume, MD;  Location: [facility];  Service: Plastics;  Laterality: Right;  ? ? ?There were no vitals filed for this visit. ? ? Subjective Assessment - 02/10/22 1322   ? ? Subjective  I get the MRI for my shoulder this Saturday. Still haven't heard about the MRI results for my back   ? Pertinent History 56 yo male with onset of fall at work off a ladder was brought to [facility] on 9/23 and received ORIF for R distal radial fracture on 9/26.  Mildly displaced R ulnar styloid fracture not requiring  sx.  Received TLSO for spinal injuries of T12 comp fx, T11 anterior wedging, B inferior articular process and spinous process fractures.  PMHx:  HA, colloid cyst of brain, idiopathic intracranial hypertension, LE edema, lumbar spondylosis and radiculopathy, chestpain, HLD, seizure disorder.   ? Limitations Back precuations from recent back surgery 12/03/21, no lifting over 15 lbs, continues tx for R shoulder and hand pain   ? Patient Stated Goals custom splint   ? Currently in Pain? Yes   ? Pain Score 4    3/10 Rt hand  ? Pain Location Shoulder   ? Pain Orientation Right   ? Pain Descriptors / Indicators Aching;Tightness   ? Pain Type Acute pain   ? Pain Onset More than a month ago   ? Pain Frequency Constant   ? Aggravating Factors  malpositioning   ? Pain Relieving Factors rest, heat meds   ? Pain Onset More than a month ago   ? ?  ?  ? ?  ? ?Back pain 7/10 - O.T. not directly addressing but adhering to back precautions t/o session ? ?Fluidotherapy Rt hand x 12 min, while hot pack to Rt shoulder at beginning of session. No adverse reactions.  ? ?Soft tissue mobs and retrograde massage to Rt hand, followed by P/ROM in composite flexion and isolated MP flex, IP flexion. Taping to Rt hand for edema/swelling  and reviewed wearing time.  ? ?Taping to Rt shoulder to relax upper traps and middle deltoid and instructed to remove immediately if sh pain worsens or doesn't help, but otherwise to keep on for 5 days.  ? ?Reviewed modified BUE sh flexion ex in supine (starting and ending positions w/ elbows bent) ? ?Issued HEP prone for scapula retraction and Rt shoulder extension to strengthen posterior sh girdle. Extensively reviewed proper way to get in/out of positions to adhere to back precautions. Pt instructed to stop ex's if any back pain. Pt required initial tactile and demo cues for scapula retraction (bilaterally) to prevent sh hiking/compensations. (See pt instructions for details)   ? ? ? ? ? ? ? ? ? ? ? ? ? ? ? ? ? ? ? ? ? ? OT Education - 02/10/22 1423   ? ? Education Details Prone HEP for scapula strengthening and posterior sh girdle strengthening   ? Person(s) Educated Patient   ? Methods Explanation;Demonstration;Verbal cues;Handout   ? Comprehension Verbalized understanding;Returned demonstration   ? ?  ?  ? ?  ? ? ? OT Short Term Goals - 01/27/22 1745   ? ?  ? OT SHORT TERM GOAL #1  ? Title I with splint wear, care and precautions   ? Status Achieved   ?  ? OT SHORT TERM GOAL #2  ? Title I with inital HEP   ? Status Achieved   ?  ? OT SHORT TERM GOAL #3  ? Title Pt will demonstrate ability to perfrom at least 90% composite finger flexion in prep for functional use.   ? Status Achieved   90%  ?  ? OT SHORT TERM GOAL #4  ? Title Pt will demonstrate wrist flexion/ extension of at least 40* in prep for functional use.   ? Period Weeks   ? Status Achieved   45*/ 40 extension  ?  ? OT SHORT TERM GOAL #5  ? Title I with edema control.   ? Status Achieved   ?  ? OT SHORT TERM GOAL #6  ? Title Pt will demonstrate 50* wrist flexion and 45* wrist extension in prep for functional use   ? Status Achieved   met 55 for flex, 50 for ext  ?  ? OT SHORT TERM GOAL #7  ? Title Pt will demonstrate forearm supination/ pronation of 85* in prep for work activities   ? Status Achieved   met 85  ?  ? OT SHORT TERM GOAL #8  ? Title Pt will demonstrate grip strength of 23 lbs or greater in prep for functional use.   ? Status Achieved   11/16/21-30.7 lbs  ?  ? OT SHORT TERM GOAL  #9  ? TITLE Pt will report increased ease with the following tasks: cutting food, opening containers, and squeezing toothpaste. Revised goal Pt will report increased ease with cutting food and opening continers using RUE.   ? Time 4   ? Period Weeks   ? Status Revised   still difficulty cutting food and opening contianers, met for squeeze toothpaste  ? Target Date 01/21/22   ?  ? OT SHORT TERM GOAL  #10  ? TITLE Pt will be I with updated  HEP to address right shoulder ROM   ? Time 4   ? Period Weeks   ? Status On-going   ? Target Date 01/21/22   ?  ? OT SHORT TERM GOAL  #11  ? TITLE Pt will demonstrate  ability to retrieve a lightweight object at 110 shoulder flexion with pain no greater than 3/10   ? Time 4   ? Period Weeks   ? Status Achieved   ? Target Date 01/21/22   ?  ? OT SHORT TERM GOAL  #12  ? TITLE Pt will demonstrate 55* wrist extension for increased ease with ADLS.   ? Time 4   ? Period Weeks   ? Status On-going   ? Target Date 01/21/22   ?  ? OT SHORT TERM GOAL  #13  ? TITLE Pt will verbalize understanding of activity modification, and positioning for sleep to minimize shoulder pain   ? Time 4   ? Period Weeks   ? Status On-going   ? Target Date 01/21/22   ? ?  ?  ? ?  ? ? ? ? OT Long Term Goals - 01/27/22 1746   ? ?  ? OT LONG TERM GOAL #1  ? Title I with updated HEP.   ? Status On-going   Pt met updated HEP prior to surgery, however pt will benefit from continued updates  ? Target Date 02/18/22   ?  ? OT LONG TERM GOAL #2  ? Title Pt will demonstrate grip strength of at least 40 lbs in prep for work activities. Revised goal-Pt will demonstrate RUE grip strength of 45 lbs or greater for increased RUE functional use.   ? Time 8   ? Period Weeks   ? Status Revised   37.2  ? Target Date 02/18/22   ?  ? OT LONG TERM GOAL #3  ? Title Pt will demonstrate wrist flexion/ extension WFLS for work activities.   ? Time 8   ? Period Weeks   ? Status On-going   ?  ? OT LONG TERM GOAL #4  ? Title Pt will demonstrate forearm supination/ pronation of at least 85* in prep for work.   ? Status --   85  ?  ? OT LONG TERM GOAL #5  ? Title Pt will resume use of RUE at least 90% of the time for ADLS/ IADLS with pain no greater than 3/10.   ? Time 8   ? Period Weeks   ? Status On-going   uses 75%, with pain 5-6/10 at worst  ? Target Date 02/18/22   ?  ? OT LONG TERM GOAL #6  ? Title Pt will perform simulated work activities modified independently   ? Status --    unable to perfrom due to pain and back prec.  ? Target Date 02/18/22   ?  ? OT LONG TERM GOAL #7  ? Title Pt will demonstrate ability to retrieve a lightweight object at 120 shoulder flexion with pain less than or equal to 3/10.   ? Time

## 2022-02-12 ENCOUNTER — Ambulatory Visit
Admission: RE | Admit: 2022-02-12 | Discharge: 2022-02-12 | Disposition: A | Discharge status: Home / Self Care | Payer: Worker's Compensation | Source: Ambulatory Visit | Attending: Orthopaedic Surgery | Admitting: Orthopaedic Surgery

## 2022-02-12 DIAGNOSIS — G8929 Other chronic pain: Secondary | ICD-10-CM

## 2022-02-15 ENCOUNTER — Ambulatory Visit: Payer: Worker's Compensation | Attending: Orthopaedic Surgery | Admitting: Occupational Therapy

## 2022-02-15 DIAGNOSIS — M25611 Stiffness of right shoulder, not elsewhere classified: Secondary | ICD-10-CM | POA: Diagnosis present

## 2022-02-15 DIAGNOSIS — M25511 Pain in right shoulder: Secondary | ICD-10-CM | POA: Insufficient documentation

## 2022-02-15 DIAGNOSIS — G8929 Other chronic pain: Secondary | ICD-10-CM | POA: Insufficient documentation

## 2022-02-15 DIAGNOSIS — M6281 Muscle weakness (generalized): Secondary | ICD-10-CM | POA: Insufficient documentation

## 2022-02-15 DIAGNOSIS — M25631 Stiffness of right wrist, not elsewhere classified: Secondary | ICD-10-CM | POA: Diagnosis present

## 2022-02-15 DIAGNOSIS — R600 Localized edema: Secondary | ICD-10-CM | POA: Insufficient documentation

## 2022-02-15 DIAGNOSIS — M25531 Pain in right wrist: Secondary | ICD-10-CM | POA: Insufficient documentation

## 2022-02-15 DIAGNOSIS — R278 Other lack of coordination: Secondary | ICD-10-CM | POA: Insufficient documentation

## 2022-02-15 NOTE — Therapy (Addendum)
[facility] ?[place], [place], [address] ?Phone: [phone]   Fax:  [phone] ? ?Occupational Therapy Treatment ? ?Patient Details  ?Name: Craig Church ?MRN: [id number] ?Date of Birth: 1966/04/26 ?Dr. Essie Christine ? ?Encounter Date: 02/15/2022 ? ? OT End of Session - 02/15/22 1354   ? ? Visit Number 44   ? Number of Visits 45   ? Date for OT Re-Evaluation 02/17/22   ? Authorization Type workers comp additional 8 visits approved following the inital 16 visits 16+8=24, 8 additional visits approved 11/11/21, additional 14 visits approved - 01/31/22   ? Authorization Time Period 14 additional visits approved on 01/31/22   ? Authorization - Visit Number 43   ? Authorization - Number of Visits 54   ? OT Start Time 1319   ? OT Stop Time 1400   ? OT Time Calculation (min) 41 min   ? ?  ?  ? ?  ? ? ?Past Medical History:  ?Diagnosis Date  ? Hypertension   ? Seizure disorder ([place])   ? ? ?Past Surgical History:  ?Procedure Laterality Date  ? CHOLECYSTECTOMY    ? OPEN REDUCTION INTERNAL FIXATION (ORIF) DISTAL PHALANX Right 07/12/2021  ? Procedure: OPEN REDUCTION INTERNAL FIXATION (ORIF) DISTAL RADIUS;  Surgeon: Cindra Presume, MD;  Location: [facility];  Service: Plastics;  Laterality: Right;  ? ? ?There were no vitals filed for this visit. ? ? Subjective Assessment - 02/15/22 1634   ? ? Subjective  Pt completed shoulder MRI   ? Pertinent History 56 yo male with onset of fall at work off a ladder was brought to [facility] on 9/23 and received ORIF for R distal radial fracture on 9/26.  Mildly displaced R ulnar styloid fracture not requiring sx.  Received TLSO for spinal injuries of T12 comp fx, T11 anterior wedging, B inferior articular process and spinous process fractures.  PMHx:  HA, colloid cyst of brain, idiopathic intracranial hypertension, LE edema, lumbar spondylosis and radiculopathy, chestpain, HLD, seizure disorder.   ? Limitations Back precuations from  recent back surgery 12/03/21, no lifting over 15 lbs, continues tx for R shoulder and hand pain, rotator cuff tear per MRI,await clarification of restrictions   ? Patient Stated Goals custom splint   ? Currently in Pain? Yes   ? Pain Score 5    ? Pain Location Shoulder   ? Pain Orientation Right   ? Pain Descriptors / Indicators Aching   ? Pain Type Acute pain   ? Pain Onset More than a month ago   ? Pain Frequency Constant   ? Aggravating Factors  malpostioning   ? Pain Relieving Factors rest, heat and meds   ? Multiple Pain Sites Yes   ? Pain Score 2   ? Pain Location Hand   ? Pain Orientation Right   ? Pain Descriptors / Indicators Aching   ? Pain Type Chronic pain   ? Pain Onset More than a month ago   ? Pain Frequency Intermittent   ? Aggravating Factors  swelling   ? Pain Relieving Factors heat   ? ?  ?  ? ?  ? ? ? ? ? ? ? ? ? ? ? ? ? ? ?Fluidotherapy x 10 mins to R hand while hotpack applied to right shoulder, no adverse reactions. ?Joint mobs to left hand and gentle passive stretch in finger flexion,  ?Kinesiotape applied to R shoulder for improved positing and pain relief, Y strip  around deltoid, and strip to relax traps, pt reports tape feels supportive. ?Supine on plinth, with head elevated, RUE supported on pillows ?Supine AA/ROM shoulder flexion  then closed chain chest press to shoulder flexion, no greater than 90*  Pt reports improved shoulder pain end of session. Pt was instructed not to perform abduction, ext rotation or shoulder extension until further clarification. ?Pt was advised to stop prone shoulder exercises awaiting clarification of precautions. ? ? ? ? ? ? ? ? ? OT Education - 02/15/22 1355   ? ? Education Details green putty exercises for sustained grip and pinch   ? Person(s) Educated Patient   ? Methods Explanation;Demonstration;Verbal cues;Handout   ? Comprehension Verbalized understanding;Returned demonstration   ? ?  ?  ? ?  ? ? ? OT Short Term Goals - 01/27/22 1745   ? ?  ? OT SHORT  TERM GOAL #1  ? Title I with splint wear, care and precautions   ? Status Achieved   ?  ? OT SHORT TERM GOAL #2  ? Title I with inital HEP   ? Status Achieved   ?  ? OT SHORT TERM GOAL #3  ? Title Pt will demonstrate ability to perfrom at least 90% composite finger flexion in prep for functional use.   ? Status Achieved   90%  ?  ? OT SHORT TERM GOAL #4  ? Title Pt will demonstrate wrist flexion/ extension of at least 40* in prep for functional use.   ? Period Weeks   ? Status Achieved   45*/ 40 extension  ?  ? OT SHORT TERM GOAL #5  ? Title I with edema control.   ? Status Achieved   ?  ? OT SHORT TERM GOAL #6  ? Title Pt will demonstrate 50* wrist flexion and 45* wrist extension in prep for functional use   ? Status Achieved   met 55 for flex, 50 for ext  ?  ? OT SHORT TERM GOAL #7  ? Title Pt will demonstrate forearm supination/ pronation of 85* in prep for work activities   ? Status Achieved   met 85  ?  ? OT SHORT TERM GOAL #8  ? Title Pt will demonstrate grip strength of 23 lbs or greater in prep for functional use.   ? Status Achieved   11/16/21-30.7 lbs  ?  ? OT SHORT TERM GOAL  #9  ? TITLE Pt will report increased ease with the following tasks: cutting food, opening containers, and squeezing toothpaste. Revised goal Pt will report increased ease with cutting food and opening continers using RUE.   ? Time 4   ? Period Weeks   ? Status Revised   still difficulty cutting food and opening contianers, met for squeeze toothpaste  ? Target Date 01/21/22   ?  ? OT SHORT TERM GOAL  #10  ? TITLE Pt will be I with updated HEP to address right shoulder ROM   ? Time 4   ? Period Weeks   ? Status On-going   ? Target Date 01/21/22   ?  ? OT SHORT TERM GOAL  #11  ? TITLE Pt will demonstrate ability to retrieve a lightweight object at 110 shoulder flexion with pain no greater than 3/10   ? Time 4   ? Period Weeks   ? Status Achieved   ? Target Date 01/21/22   ?  ? OT SHORT TERM GOAL  #12  ? TITLE Pt will demonstrate 55*  wrist extension for increased ease with ADLS.   ? Time 4   ? Period Weeks   ? Status On-going   ? Target Date 01/21/22   ?  ? OT SHORT TERM GOAL  #13  ? TITLE Pt will verbalize understanding of activity modification, and positioning for sleep to minimize shoulder pain   ? Time 4   ? Period Weeks   ? Status On-going   ? Target Date 01/21/22   ? ?  ?  ? ?  ? ? ? ? OT Long Term Goals - 01/27/22 1746   ? ?  ? OT LONG TERM GOAL #1  ? Title I with updated HEP.   ? Status On-going   Pt met updated HEP prior to surgery, however pt will benefit from continued updates  ? Target Date 02/18/22   ?  ? OT LONG TERM GOAL #2  ? Title Pt will demonstrate grip strength of at least 40 lbs in prep for work activities. Revised goal-Pt will demonstrate RUE grip strength of 45 lbs or greater for increased RUE functional use.   ? Time 8   ? Period Weeks   ? Status Revised   37.2  ? Target Date 02/18/22   ?  ? OT LONG TERM GOAL #3  ? Title Pt will demonstrate wrist flexion/ extension WFLS for work activities.   ? Time 8   ? Period Weeks   ? Status On-going   ?  ? OT LONG TERM GOAL #4  ? Title Pt will demonstrate forearm supination/ pronation of at least 85* in prep for work.   ? Status --   85  ?  ? OT LONG TERM GOAL #5  ? Title Pt will resume use of RUE at least 90% of the time for ADLS/ IADLS with pain no greater than 3/10.   ? Time 8   ? Period Weeks   ? Status On-going   uses 75%, with pain 5-6/10 at worst  ? Target Date 02/18/22   ?  ? OT LONG TERM GOAL #6  ? Title Pt will perform simulated work activities modified independently   ? Status --   unable to perfrom due to pain and back prec.  ? Target Date 02/18/22   ?  ? OT LONG TERM GOAL #7  ? Title Pt will demonstrate ability to retrieve a lightweight object at 120 shoulder flexion with pain less than or equal to 3/10.   ? Time 8   ? Period Weeks   ? Status On-going   ? Target Date 02/18/22   ?  ? OT LONG TERM GOAL #8  ? Title Pt will increase RUE tip, and 3 pt pinch to 8 lbs or  greater for increased functional use of RUE.   ? Baseline Tip 4 lbs, 3pt 4 lbs   ? Time 8   ? Period Weeks   ? Status On-going   ? Target Date 02/18/22   ? ?  ?  ? ?  ? ? ? ? ? ? ? ? Plan - 02/15/22 1636   ? ? Clin

## 2022-02-16 ENCOUNTER — Telehealth: Payer: Self-pay | Admitting: Occupational Therapy

## 2022-02-16 NOTE — Telephone Encounter (Signed)
Dr. Magnus Ivan, ? ?Letcher Schweikert is receiving OT at our site for his right shoulder.  ?I reviewed the results of his recent shoulder MRI which indicates partial thickness tears of supraspinatus and infraspinatus. ? ?Please advise how we should proceed with OT based on this new information.  ? ?Please advise regarding any new precautions. ?  ?Thanks for your assistance, ?Samara Deist Sybrina Laning, OTR/L ?

## 2022-02-17 ENCOUNTER — Ambulatory Visit: Payer: Worker's Compensation | Attending: Family Medicine | Admitting: Occupational Therapy

## 2022-02-17 DIAGNOSIS — M25531 Pain in right wrist: Secondary | ICD-10-CM | POA: Insufficient documentation

## 2022-02-17 DIAGNOSIS — M6281 Muscle weakness (generalized): Secondary | ICD-10-CM | POA: Insufficient documentation

## 2022-02-17 DIAGNOSIS — M25511 Pain in right shoulder: Secondary | ICD-10-CM | POA: Diagnosis present

## 2022-02-17 DIAGNOSIS — M25611 Stiffness of right shoulder, not elsewhere classified: Secondary | ICD-10-CM | POA: Insufficient documentation

## 2022-02-17 DIAGNOSIS — R278 Other lack of coordination: Secondary | ICD-10-CM | POA: Diagnosis present

## 2022-02-17 DIAGNOSIS — M25631 Stiffness of right wrist, not elsewhere classified: Secondary | ICD-10-CM | POA: Diagnosis present

## 2022-02-17 DIAGNOSIS — R600 Localized edema: Secondary | ICD-10-CM | POA: Diagnosis present

## 2022-02-17 DIAGNOSIS — G8929 Other chronic pain: Secondary | ICD-10-CM | POA: Insufficient documentation

## 2022-02-17 NOTE — Therapy (Signed)
[place] [facility], [place], [address] ?Phone: [phone]   Fax:  [phone] ? ?Occupational Therapy Treatment ? ?Patient Details  ?Name: Craig Church ?MRN: [id number] ?Date of Birth: 1966-07-21 ?Referring Provider (OT): Dr. Ninfa Linden Dr. [name] ? ? ?Encounter Date: 02/17/2022 ? ? OT End of Session - 02/17/22 1452   ? ? Visit Number 62   ? Number of Visits 61   ? Date for OT Re-Evaluation 04/15/22   ? Authorization Type workers comp additional 8 visits approved following the inital 16 visits 16+8=24, 8 additional visits approved 11/11/21, additional 14 visits approved - 01/31/22   ? Authorization Time Period 14 additional visits approved on 01/31/22   ? Authorization - Visit Number 44   ? Authorization - Number of Visits 54   ? OT Start Time 1446   ? OT Stop Time 1540   ? OT Time Calculation (min) 54 min   ? Equipment Utilized During Treatment Hot pack, fluidotherapy   ? Activity Tolerance Patient tolerated treatment well   ? Behavior During Therapy [facility] for tasks assessed/performed   ? ?  ?  ? ?  ? ? ?Past Medical History:  ?Diagnosis Date  ? Hypertension   ? Seizure disorder ([place])   ? ? ?Past Surgical History:  ?Procedure Laterality Date  ? CHOLECYSTECTOMY    ? OPEN REDUCTION INTERNAL FIXATION (ORIF) DISTAL PHALANX Right 07/12/2021  ? Procedure: OPEN REDUCTION INTERNAL FIXATION (ORIF) DISTAL RADIUS;  Surgeon: Cindra Presume, MD;  Location: [facility];  Service: Plastics;  Laterality: Right;  ? ? ?There were no vitals filed for this visit. ? ? Subjective Assessment - 02/17/22 1456   ? ? Subjective  Pt reports the tape helped his shoulder pain   ? Pertinent History 56 yo male with onset of fall at work off a ladder was brought to [facility] on 9/23 and received ORIF for R distal radial fracture on 9/26.  Mildly displaced R ulnar styloid fracture not requiring sx.  Received TLSO for spinal injuries of T12 comp fx, T11 anterior wedging, B inferior articular  process and spinous process fractures.  PMHx:  HA, colloid cyst of brain, idiopathic intracranial hypertension, LE edema, lumbar spondylosis and radiculopathy, chestpain, HLD, seizure disorder.   ? Limitations Back precuations from recent back surgery 12/03/21, no lifting over 15 lbs, continues tx for R shoulder and hand pain, rotator cuff tear per MRI,await clarification of restrictions Per Dr. Ninfa Linden- no excessive use of overhead weights to right shoulder   ? Special Tests MRI of R shoulder results-Small partial-thickness midsubstance insertional tear of the mid  to posterior supraspinatus tendon footprint. No tendon retraction.  2. Tiny partial-thickness tear within the mid to anterior  infraspinatus tendon in the region of subcortical cystic change  within the underlying humeral head.  3. Mild degenerative changes of the acromioclavicular joint.  4. Mild-to-moderate thinning of the glenoid and humeral head  cartilage.   ? Patient Stated Goals custom splint   ? Currently in Pain? Yes   ? Pain Score 3    ? Pain Location Shoulder   ? Pain Orientation Right   ? Pain Descriptors / Indicators Aching   ? Pain Type Acute pain   ? Pain Onset More than a month ago   ? Pain Frequency Constant   ? Aggravating Factors  malpositioning   ? Pain Relieving Factors rest, heat, meds   ? Pain Score 2   ? Pain Location Hand   ?  Pain Orientation Right   ? Pain Descriptors / Indicators Aching   ? Pain Type Chronic pain   ? Pain Onset More than a month ago   ? Pain Frequency Intermittent   ? Aggravating Factors  swelling   ? ?  ?  ? ?  ? ? ? ? ? ? ?Fluidotherapy x 9 mins to right  hand while hotpack applied to right shoulder, no adverse reactions. ?Retrograde massage and joint mobs to left hand and gentle passive stretch in finger flexion,  ?Tendon gliding exercises, prayer stretch and passive composite flexion. ?Pt reports kinesiotape  on shoulder is still working well .He was instructed to remove gently on Saturday(Pt is aware he  should wet first in the shower) ? ?Seated  A/ROM  closed chain shoulder flexion, chest press and abduction in low range with cane, min v.c for positioning and not to exceed 90* with shoulder flexion,  ?Abduction was discontinued as it aggravates pt's back. ?Pt was advised to stop prone shoulder exercises at this time ?Therapist checked progress towards long and short term goals in prep for renewal. ? ?Discussed bed positioning to minimize R shoulder pain as pt has been sleeping in recliner. Pt verbalized understanding ?Pt is still pursuing a second opinion for hand.  ?MD only restricted use of overhead weights with  RUE due to recent MD which indicates supraspinatus and infraspinatus partial  tear. ? ? ? ? ? ? ? ? ? ? ? ? ? ? ? ? OT Education - 02/18/22 0805   ? ? Education Details updated exercises for chest press and shoulder flexion cane in seated   ? Person(s) Educated Patient   ? Methods Explanation;Demonstration;Verbal cues;Handout   ? Comprehension Verbalized understanding;Returned demonstration   ? ?  ?  ? ?  ? ? ? OT Short Term Goals - 02/17/22 1518   ? ?  ? OT SHORT TERM GOAL #1  ? Title -----   ? Status --   ?  ? OT SHORT TERM GOAL #2  ? Title -------------   ? Status --   ?  ? OT SHORT TERM GOAL #3  ? Title ------------   ? Status --   ?  ? OT SHORT TERM GOAL #4  ? Title -------------   ? Period --   ? Status --   ?  ? OT SHORT TERM GOAL #5  ? Title --------------   ? Status --   ?  ? OT SHORT TERM GOAL #6  ? Title ---------------   ? Status --   ?  ? OT SHORT TERM GOAL #7  ? Title --------------   ? Status --   ?  ? OT SHORT TERM GOAL #8  ? Title ---------------   ? Status --   ?  ? OT SHORT TERM GOAL  #9  ? TITLE Pt will report increased ease with cutting food and opening containers using RUE.   ? Time 4   ? Period Weeks   ? Status On-going   still difficulty with cutting food, sometime has difficulty with opening containers, difficulty using a can opener  ? Target Date 03/18/22   ?  ? OT SHORT TERM  GOAL  #10  ? TITLE Pt will be I with updated HEP to address right shoulder ROM   ? Time 4   ? Period Weeks   ? Status On-going   Met for s beginning HEP for shoulder, but pt can benefit from updates  ?  Target Date 03/18/22   ?  ? OT SHORT TERM GOAL  #11  ? TITLE Pt will demonstrate ability to retrieve a lightweight object at 110 shoulder flexion with pain no greater than 3/10   ? Time 4   ? Period Weeks   ? Status On-going   100 with pain 4/10  ? Target Date 01/21/22   ?  ? OT SHORT TERM GOAL  #12  ? TITLE Pt will demonstrate 55* wrist extension for increased ease with ADLS.   ? Time 4   ? Period Weeks   ? Status On-going   50 wrist extension  ? Target Date --   ?  ? OT SHORT TERM GOAL  #13  ? TITLE Pt will verbalize understanding of activity modification, and positioning for sleep to minimize shoulder pain   ? Time 4   ? Period Weeks   ? Status On-going   inital education provided, pt can benefit from ongoing reinforcement  ? Target Date --   ? ?  ?  ? ?  ? ? ? ? OT Long Term Goals - 02/18/22 0836   ? ?  ? OT LONG TERM GOAL #1  ? Title I with updated HEP.   ? Status On-going   met for hand-Pt has updated HEP for green putty  ? Target Date 02/18/22   ?  ? OT LONG TERM GOAL #2  ? Title Pt will demonstrate RUE grip strength of 45 lbs or greater for increased RUE functional use.   ? Time 8   ? Period Weeks   ? Status On-going   41.5- 02/17/22  ? Target Date 04/15/22   ?  ? OT LONG TERM GOAL #3  ? Title Pt will demonstrate wrist flexion/ extension WFLS for work activities.   ? Time 8   ? Period Weeks   ? Status Partially Met   wrist flexion/ extension:60/45-50- met for flexion only  ?  ? OT LONG TERM GOAL #4  ? Title ----   ?  ? OT LONG TERM GOAL #5  ? Title Pt will resume use of RUE at least 90% of the time for ADLS/ IADLS with pain no greater than 3/10.   ? Time 8   ? Period Weeks   ? Status On-going   uses 75%, with pain 5/10 at worst  ?  ? OT LONG TERM GOAL #6  ? Title ---------------   ? Status -  ?  ? OT LONG TERM  GOAL #7  ? Title Pt will demonstrate ability to retrieve a lightweight object at 120 shoulder flexion with pain less than or equal to 3/10.   ? Time 8   ? Period Weeks   ? Status On-going   100 with pain 4/10,

## 2022-02-17 NOTE — Patient Instructions (Addendum)
SHOULDER: Flexion - Sitting ? ? ? ?Hold cane with both hands. Raise arms up. Keep elbows straight. Hold _5__ seconds.  ?__10_ reps per set, _2__ sets per day, _7__ days per week raise to eye level no weight ? ? ?Then perform chest press in seated with cane 10 reps 2x day stop if pain ? ?Remove kinesiotape on Saturday ? ? ?

## 2022-02-22 ENCOUNTER — Ambulatory Visit: Payer: Worker's Compensation | Admitting: Occupational Therapy

## 2022-02-22 DIAGNOSIS — R278 Other lack of coordination: Secondary | ICD-10-CM

## 2022-02-22 DIAGNOSIS — M25631 Stiffness of right wrist, not elsewhere classified: Secondary | ICD-10-CM

## 2022-02-22 DIAGNOSIS — M25531 Pain in right wrist: Secondary | ICD-10-CM

## 2022-02-22 DIAGNOSIS — M25511 Pain in right shoulder: Secondary | ICD-10-CM | POA: Diagnosis not present

## 2022-02-22 DIAGNOSIS — M25611 Stiffness of right shoulder, not elsewhere classified: Secondary | ICD-10-CM

## 2022-02-22 DIAGNOSIS — M6281 Muscle weakness (generalized): Secondary | ICD-10-CM

## 2022-02-22 DIAGNOSIS — G8929 Other chronic pain: Secondary | ICD-10-CM

## 2022-02-22 NOTE — Patient Instructions (Addendum)
? ?  Lie on back holding wand. Raise arms  to eye level. Hold 5sec. ?Repeat 10 times per set.  Do 2-3 sessions per day. ? ? ?ROM: Abduction - Wand ? ? ? ? ? ? ? ?Press-Up With Wand ? ? ?Press wand up until elbows are straight, then reach wand over head to a pain free range. ?Hold 5 seconds. Repeat 10 times. Do 2-3 sessions per day. ? ? ? ? ?Active Assistive Shoulder External Rotation ? ? ? ?Lay on your back in recliner with stick at waist level, right palm up, other palm down.  push forearm out from body with hand palm up, and keep elbows bent. Hold. return to start position. ?Perform 10 reps. 2-3 X DAY ? ? ?

## 2022-02-23 DIAGNOSIS — M6281 Muscle weakness (generalized): Secondary | ICD-10-CM | POA: Diagnosis present

## 2022-02-23 DIAGNOSIS — R600 Localized edema: Secondary | ICD-10-CM | POA: Diagnosis present

## 2022-02-23 DIAGNOSIS — M25511 Pain in right shoulder: Secondary | ICD-10-CM | POA: Diagnosis present

## 2022-02-23 DIAGNOSIS — R278 Other lack of coordination: Secondary | ICD-10-CM | POA: Diagnosis present

## 2022-02-23 DIAGNOSIS — M25611 Stiffness of right shoulder, not elsewhere classified: Secondary | ICD-10-CM | POA: Diagnosis present

## 2022-02-23 DIAGNOSIS — M25531 Pain in right wrist: Secondary | ICD-10-CM | POA: Diagnosis present

## 2022-02-23 DIAGNOSIS — M25631 Stiffness of right wrist, not elsewhere classified: Secondary | ICD-10-CM | POA: Diagnosis present

## 2022-02-23 DIAGNOSIS — G8929 Other chronic pain: Secondary | ICD-10-CM | POA: Diagnosis present

## 2022-02-23 NOTE — Therapy (Signed)
?[place] [facility] ?[place], [place], [address] ?Phone: [phone]   Fax:  [phone] ? ?Occupational Therapy Treatment ? ?Patient Details  ?Name: Craig Church ?MRN: [id number] ?Date of Birth: 19-Feb-1966 ?Referring Provider (OT): Dr. Nikita Lagos. [name] (Dr. Ninfa Linden for shoulder, Dr. [name] for wrist) ? ? ?Encounter Date: 02/22/2022 ? ? OT End of Session - 02/22/22 1343   ? ? Visit Number 72   ? Number of Visits 61   ? Date for OT Re-Evaluation 04/15/22   ? Authorization Time Period 14 additional visits approved on 01/31/22   ? Authorization - Visit Number 32   ? Authorization - Number of Visits 54   ? OT Start Time 1313   ? OT Stop Time 1358   ? OT Time Calculation (min) 45 min   ? ?  ?  ? ?  ? ? ?Past Medical History:  ?Diagnosis Date  ? Hypertension   ? Seizure disorder ([place])   ? ? ?Past Surgical History:  ?Procedure Laterality Date  ? CHOLECYSTECTOMY    ? OPEN REDUCTION INTERNAL FIXATION (ORIF) DISTAL PHALANX Right 07/12/2021  ? Procedure: OPEN REDUCTION INTERNAL FIXATION (ORIF) DISTAL RADIUS;  Surgeon: Cindra Presume, MD;  Location: [facility];  Service: Plastics;  Laterality: Right;  ? ? ?There were no vitals filed for this visit. ? ? Subjective Assessment - 02/23/22 1545   ? ? Subjective  Pt reports the tape helped his shoulder pain   ? Pertinent History 56 yo male with onset of fall at work off a ladder was brought to [facility] on 9/23 and received ORIF for R distal radial fracture on 9/26.  Mildly displaced R ulnar styloid fracture not requiring sx.  Received TLSO for spinal injuries of T12 comp fx, T11 anterior wedging, B inferior articular process and spinous process fractures.  PMHx:  HA, colloid cyst of brain, idiopathic intracranial hypertension, LE edema, lumbar spondylosis and radiculopathy, chestpain, HLD, seizure disorder.   ? Limitations Back precuations from recent back surgery 12/03/21, no lifting over 15 lbs, continues tx for R shoulder and  hand pain, rotator cuff tear per MRI,await clarification of restrictions Per Dr. Ninfa Linden- no excessive use of overhead weights to right shoulder   ? Special Tests MRI of R shoulder results-Small partial-thickness midsubstance insertional tear of the mid  to posterior supraspinatus tendon footprint. No tendon retraction.  2. Tiny partial-thickness tear within the mid to anterior  infraspinatus tendon in the region of subcortical cystic change  within the underlying humeral head.  3. Mild degenerative changes of the acromioclavicular joint.  4. Mild-to-moderate thinning of the glenoid and humeral head  cartilage.   ? Patient Stated Goals custom splint   ? Currently in Pain? Yes   ? Pain Score 4    ? Pain Location Shoulder   ? Pain Orientation Right   ? Pain Descriptors / Indicators Aching   ? Pain Type Chronic pain   ? Pain Onset More than a month ago   ? Pain Frequency Constant   ? Aggravating Factors  malpositioning   ? Pain Relieving Factors tape   ? Multiple Pain Sites Yes   ? Pain Score 4   ? Pain Location Hand   ? Pain Orientation Right   ? Pain Type Chronic pain   ? Pain Onset More than a month ago   ? Pain Frequency Intermittent   ? Aggravating Factors  swelling   ? Pain Relieving Factors heat   ? ?  ?  ? ?  ? ? ? ? ? ? ? ? ? ? ? ? ? ?  Fluidotherapy x 9 mins to right  hand while hotpack applied to right shoulder, no adverse reactions. ?Retrograde massage and joint mobs to left hand and gentle passive stretch in finger flexion,  ?Tendon gliding exercises,  passive composite flexion. . Active hands brace applied to hand to promote composite flexion, passive stretch. ?Kinesiotape applied to right shoulder(y piece around deltoid and strip to relax traps.) ?Supine A/ROM  closed chain shoulder flexion, chest press and external rotation with cane, on plinth min v.c for positioning and not to exceed 90* with shoulder flexion,  ?MD only restricted use of overhead weights with  RUE due to recent MD which indicates  supraspinatus and infraspinatus partial  tear. ?Pt reports decreased pain in shoulder and hand at end of session. ? ? ? ? ? ? ? ? ? ? ? OT Short Term Goals - 02/17/22 1518   ? ?  ? OT SHORT TERM GOAL #1  ? Title -----   ? Status --   ?  ? OT SHORT TERM GOAL #2  ? Title -------------   ? Status --   ?  ? OT SHORT TERM GOAL #3  ? Title ------------   ? Status --   ?  ? OT SHORT TERM GOAL #4  ? Title -------------   ? Period --   ? Status --   ?  ? OT SHORT TERM GOAL #5  ? Title --------------   ? Status --   ?  ? OT SHORT TERM GOAL #6  ? Title ---------------   ? Status --   ?  ? OT SHORT TERM GOAL #7  ? Title --------------   ? Status --   ?  ? OT SHORT TERM GOAL #8  ? Title ---------------   ? Status --   ?  ? OT SHORT TERM GOAL  #9  ? TITLE Pt will report increased ease with cutting food and opening containers using RUE.   ? Time 4   ? Period Weeks   ? Status On-going   still difficulty with cutting food, sometime has difficulty with opening containers, difficulty using a can opener  ? Target Date 03/18/22   ?  ? OT SHORT TERM GOAL  #10  ? TITLE Pt will be I with updated HEP to address right shoulder ROM   ? Time 4   ? Period Weeks   ? Status On-going   Met for s beginning HEP for shoulder, but pt can benefit from updates  ? Target Date 03/18/22   ?  ? OT SHORT TERM GOAL  #11  ? TITLE Pt will demonstrate ability to retrieve a lightweight object at 110 shoulder flexion with pain no greater than 3/10   ? Time 4   ? Period Weeks   ? Status On-going   100 with pain 4/10  ? Target Date 01/21/22   ?  ? OT SHORT TERM GOAL  #12  ? TITLE Pt will demonstrate 55* wrist extension for increased ease with ADLS.   ? Time 4   ? Period Weeks   ? Status On-going   50 wrist extension  ? Target Date --   ?  ? OT SHORT TERM GOAL  #13  ? TITLE Pt will verbalize understanding of activity modification, and positioning for sleep to minimize shoulder pain   ? Time 4   ? Period Weeks   ? Status On-going   inital education provided, pt can  benefit from ongoing reinforcement  ? Target  Date --   ? ?  ?  ? ?  ? ? ? ? OT Long Term Goals - 02/18/22 0836   ? ?  ? OT LONG TERM GOAL #1  ? Title I with updated HEP.   ? Status On-going   met for hand-Pt has updated HEP for green putty  ? Target Date 02/18/22   ?  ? OT LONG TERM GOAL #2  ? Title Pt will demonstrate RUE grip strength of 45 lbs or greater for increased RUE functional use.   ? Time 8   ? Period Weeks   ? Status On-going   41.5- 02/17/22  ? Target Date 04/15/22   ?  ? OT LONG TERM GOAL #3  ? Title Pt will demonstrate wrist flexion/ extension WFLS for work activities.   ? Time 8   ? Period Weeks   ? Status Partially Met   wrist flexion/ extension:60/45-50- met for flexion only  ?  ? OT LONG TERM GOAL #4  ? Title ----   ?  ? OT LONG TERM GOAL #5  ? Title Pt will resume use of RUE at least 90% of the time for ADLS/ IADLS with pain no greater than 3/10.   ? Time 8   ? Period Weeks   ? Status On-going   uses 75%, with pain 5/10 at worst  ?  ? OT LONG TERM GOAL #6  ? Title ---------------   ? Status --   unable to perfrom due to pain and back prec.  ?  ? OT LONG TERM GOAL #7  ? Title Pt will demonstrate ability to retrieve a lightweight object at 120 shoulder flexion with pain less than or equal to 3/10.   ? Time 8   ? Period Weeks   ? Status On-going   100 with pain 4/10, 120 with pain 6/10  ? Target Date 02/18/22   ?  ? OT LONG TERM GOAL #8  ? Title Pt will increase RUE tip, and 3 pt pinch to 8 lbs or greater for increased functional use of RUE.   ? Baseline Tip 4 lbs, 3pt 4 lbs   ? Time 8   ? Period Weeks   ? Status Achieved   tip 9lbs , 3pt 9 lbs  ? Target Date 02/18/22   ? ?  ?  ? ?  ? ? ? ? ? ? ? ? ? ?Patient will benefit from skilled therapeutic intervention in order to improve the following deficits and impairments:   ?  ?  ?  ? ? ?Visit Diagnosis: ?Chronic right shoulder pain ? ?Stiffness of right shoulder, not elsewhere classified ? ?Muscle weakness (generalized) ? ?Other lack of  coordination ? ?Stiffness of right wrist, not elsewhere classified ? ?Pain in right wrist ? ? ? ?Problem List ?Patient Active Problem List  ? Diagnosis Date Noted  ? Fall 07/09/2021  ? Arthritis, multiple joint involvement 12/0

## 2022-03-01 ENCOUNTER — Ambulatory Visit: Payer: Worker's Compensation | Attending: Family Medicine | Admitting: Occupational Therapy

## 2022-03-01 DIAGNOSIS — M25531 Pain in right wrist: Secondary | ICD-10-CM | POA: Insufficient documentation

## 2022-03-01 DIAGNOSIS — M25611 Stiffness of right shoulder, not elsewhere classified: Secondary | ICD-10-CM | POA: Diagnosis present

## 2022-03-01 DIAGNOSIS — R278 Other lack of coordination: Secondary | ICD-10-CM | POA: Insufficient documentation

## 2022-03-01 DIAGNOSIS — M25631 Stiffness of right wrist, not elsewhere classified: Secondary | ICD-10-CM | POA: Diagnosis present

## 2022-03-01 DIAGNOSIS — M6281 Muscle weakness (generalized): Secondary | ICD-10-CM | POA: Insufficient documentation

## 2022-03-01 DIAGNOSIS — G8929 Other chronic pain: Secondary | ICD-10-CM | POA: Diagnosis present

## 2022-03-01 DIAGNOSIS — M25511 Pain in right shoulder: Secondary | ICD-10-CM | POA: Diagnosis not present

## 2022-03-01 DIAGNOSIS — R600 Localized edema: Secondary | ICD-10-CM | POA: Insufficient documentation

## 2022-03-01 NOTE — Therapy (Signed)
[facility] [facility], [place], [address] ?Phone: [phone]   Fax:  [phone] ? ?Occupational Therapy Treatment ? ?Patient Details  ?Name: Craig Church ?MRN: [id number] ?Date of Birth: July 18, 1966 ?Referring Provider (OT): Dr. Maryan Puls. [name] (Dr. Rush Farmer for shoulder, Dr. [name] for wrist) ? ? ?Encounter Date: 03/01/2022 ? ? OT End of Session - 03/01/22 1333   ? ? Visit Number 90   ? Number of Visits 61   ? Date for OT Re-Evaluation 04/15/22   ? Authorization Time Period 14 additional visits approved on 01/31/22   ? Authorization - Visit Number 46   ? Authorization - Number of Visits 54   ? OT Start Time 1318   ? OT Stop Time 1400   ? OT Time Calculation (min) 42 min   ? ?  ?  ? ?  ? ? ?Past Medical History:  ?Diagnosis Date  ? Hypertension   ? Seizure disorder ([place])   ? ? ?Past Surgical History:  ?Procedure Laterality Date  ? CHOLECYSTECTOMY    ? OPEN REDUCTION INTERNAL FIXATION (ORIF) DISTAL PHALANX Right 07/12/2021  ? Procedure: OPEN REDUCTION INTERNAL FIXATION (ORIF) DISTAL RADIUS;  Surgeon: Cindra Presume, MD;  Location: [facility];  Service: Plastics;  Laterality: Right;  ? ? ?There were no vitals filed for this visit. ? ? Subjective Assessment - 03/01/22 1331   ? ? Subjective  Pt reports the tape helped his shoulder pain, he removed on Saturday   ? Pertinent History 56 yo male with onset of fall at work off a ladder was brought to [facility] on 9/23 and received ORIF for R distal radial fracture on 9/26.  Mildly displaced R ulnar styloid fracture not requiring sx.  Received TLSO for spinal injuries of T12 comp fx, T11 anterior wedging, B inferior articular process and spinous process fractures.  PMHx:  HA, colloid cyst of brain, idiopathic intracranial hypertension, LE edema, lumbar spondylosis and radiculopathy, chestpain, HLD, seizure disorder.   ? Limitations Back precuations from recent back surgery 12/03/21, no lifting over 15 lbs, continues tx  for R shoulder and hand pain, rotator cuff tear per MRI,await clarification of restrictions Per Dr. Ninfa Linden- no excessive use of overhead weights to right shoulder   ? Special Tests MRI of R shoulder results-Small partial-thickness midsubstance insertional tear of the mid  to posterior supraspinatus tendon footprint. No tendon retraction.  2. Tiny partial-thickness tear within the mid to anterior  infraspinatus tendon in the region of subcortical cystic change  within the underlying humeral head.  3. Mild degenerative changes of the acromioclavicular joint.  4. Mild-to-moderate thinning of the glenoid and humeral head  cartilage.   ? Currently in Pain? Yes   ? Pain Score 3    ? Pain Location Hand   shoulder  ? Pain Orientation Right   ? Pain Descriptors / Indicators Aching   ? Pain Type Chronic pain   ? Pain Onset More than a month ago   ? Pain Frequency Constant   ? Aggravating Factors  malpositioning   ? Pain Relieving Factors tape   ? ?  ?  ? ?  ? ? ? ? ? ? ? ? ? ? ? ? ? ?Fluidotherapy x 9 mins to right  hand while hotpack applied to right shoulder, no adverse reactions. ?Retrograde massage and joint mobs to left hand and gentle passive stretch in finger flexion,  ? Active hands brace applied to hand to promote composite flexion, passive  stretch while therapist applied shoulder kinesiotape. ?Kinesiotape applied to right shoulder(y piece around deltoid and strip to relax traps.), Therapist also applied strip to right wrist for extension assist and edema management. Pt verbalized understanding of precautions. ?Pt reports increased wrist and forearm pain, therapist recommends pt reduces the amount of putty exercises he is performing. ?Seated AA/ROM  with UE ranger, min facilitation for shoulder positioning shoulder flexion,and circumduction then standing abduction and external rotation min v.c/ facilitation for positioning  ? ?MD only restricted use of overhead weights with  RUE due to recent MD which indicates  supraspinatus and infraspinatus partial  tear. ? ? ? ? ? ? ? ? ? ? ? OT Short Term Goals - 02/17/22 1518   ? ?  ? OT SHORT TERM GOAL #1  ? Title -----   ? Status --   ?  ? OT SHORT TERM GOAL #2  ? Title -------------   ? Status --   ?  ? OT SHORT TERM GOAL #3  ? Title ------------   ? Status --   ?  ? OT SHORT TERM GOAL #4  ? Title -------------   ? Period --   ? Status --   ?  ? OT SHORT TERM GOAL #5  ? Title --------------   ? Status --   ?  ? OT SHORT TERM GOAL #6  ? Title ---------------   ? Status --   ?  ? OT SHORT TERM GOAL #7  ? Title --------------   ? Status --   ?  ? OT SHORT TERM GOAL #8  ? Title ---------------   ? Status --   ?  ? OT SHORT TERM GOAL  #9  ? TITLE Pt will report increased ease with cutting food and opening containers using RUE.   ? Time 4   ? Period Weeks   ? Status On-going   still difficulty with cutting food, sometime has difficulty with opening containers, difficulty using a can opener  ? Target Date 03/18/22   ?  ? OT SHORT TERM GOAL  #10  ? TITLE Pt will be I with updated HEP to address right shoulder ROM   ? Time 4   ? Period Weeks   ? Status On-going   Met for s beginning HEP for shoulder, but pt can benefit from updates  ? Target Date 03/18/22   ?  ? OT SHORT TERM GOAL  #11  ? TITLE Pt will demonstrate ability to retrieve a lightweight object at 110 shoulder flexion with pain no greater than 3/10   ? Time 4   ? Period Weeks   ? Status On-going   100 with pain 4/10  ? Target Date 01/21/22   ?  ? OT SHORT TERM GOAL  #12  ? TITLE Pt will demonstrate 55* wrist extension for increased ease with ADLS.   ? Time 4   ? Period Weeks   ? Status On-going   50 wrist extension  ? Target Date --   ?  ? OT SHORT TERM GOAL  #13  ? TITLE Pt will verbalize understanding of activity modification, and positioning for sleep to minimize shoulder pain   ? Time 4   ? Period Weeks   ? Status On-going   inital education provided, pt can benefit from ongoing reinforcement  ? Target Date --   ? ?  ?  ? ?   ? ? ? ? OT Long Term Goals - 02/18/22 0836   ? ?  ?  OT LONG TERM GOAL #1  ? Title I with updated HEP.   ? Status On-going   met for hand-Pt has updated HEP for green putty  ? Target Date 02/18/22   ?  ? OT LONG TERM GOAL #2  ? Title Pt will demonstrate RUE grip strength of 45 lbs or greater for increased RUE functional use.   ? Time 8   ? Period Weeks   ? Status On-going   41.5- 02/17/22  ? Target Date 04/15/22   ?  ? OT LONG TERM GOAL #3  ? Title Pt will demonstrate wrist flexion/ extension WFLS for work activities.   ? Time 8   ? Period Weeks   ? Status Partially Met   wrist flexion/ extension:60/45-50- met for flexion only  ?  ? OT LONG TERM GOAL #4  ? Title ----   ?  ? OT LONG TERM GOAL #5  ? Title Pt will resume use of RUE at least 90% of the time for ADLS/ IADLS with pain no greater than 3/10.   ? Time 8   ? Period Weeks   ? Status On-going   uses 75%, with pain 5/10 at worst  ?  ? OT LONG TERM GOAL #6  ? Title ---------------   ? Status --   unable to perfrom due to pain and back prec.  ?  ? OT LONG TERM GOAL #7  ? Title Pt will demonstrate ability to retrieve a lightweight object at 120 shoulder flexion with pain less than or equal to 3/10.   ? Time 8   ? Period Weeks   ? Status On-going   100 with pain 4/10, 120 with pain 6/10  ? Target Date 02/18/22   ?  ? OT LONG TERM GOAL #8  ? Title Pt will increase RUE tip, and 3 pt pinch to 8 lbs or greater for increased functional use of RUE.   ? Baseline Tip 4 lbs, 3pt 4 lbs   ? Time 8   ? Period Weeks   ? Status Achieved   tip 9lbs , 3pt 9 lbs  ? Target Date 02/18/22   ? ?  ?  ? ?  ? ? ? ? ? ? ? ? Plan - 03/02/22 0753   ? ? Clinical Impression Statement Pt is progressing towards goals. Pt demonstrates improving shoulder ROM. Hand still remains stiff.   ? OT Occupational Profile and History Detailed Assessment- Review of Records and additional review of physical, cognitive, psychosocial history related to current functional performance   ? Occupational performance  deficits (Please refer to evaluation for details): ADL's;IADL's;Work;Social Participation;Rest and Sleep;Leisure   ? Body Structure / Function / Physical Skills ADL;UE functional use;Endurance;Balance;Flexi

## 2022-03-03 ENCOUNTER — Ambulatory Visit: Payer: Worker's Compensation | Admitting: Occupational Therapy

## 2022-03-03 DIAGNOSIS — M25511 Pain in right shoulder: Secondary | ICD-10-CM | POA: Diagnosis not present

## 2022-03-03 DIAGNOSIS — M25631 Stiffness of right wrist, not elsewhere classified: Secondary | ICD-10-CM

## 2022-03-03 DIAGNOSIS — M25611 Stiffness of right shoulder, not elsewhere classified: Secondary | ICD-10-CM

## 2022-03-03 DIAGNOSIS — R278 Other lack of coordination: Secondary | ICD-10-CM

## 2022-03-03 DIAGNOSIS — M6281 Muscle weakness (generalized): Secondary | ICD-10-CM

## 2022-03-03 DIAGNOSIS — M25531 Pain in right wrist: Secondary | ICD-10-CM

## 2022-03-03 DIAGNOSIS — G8929 Other chronic pain: Secondary | ICD-10-CM

## 2022-03-03 DIAGNOSIS — R600 Localized edema: Secondary | ICD-10-CM

## 2022-03-04 NOTE — Therapy (Signed)
[facility] [address] [place], [place], [address] Phone: [phone]   Fax:  [phone]  Occupational Therapy Treatment  Patient Details  Name: Craig Church MRN: [id number] Date of Birth: 04-Apr-1966 Referring Provider (OT): Dr. Carnel Lagos. [name] (Dr. Ninfa Linden for shoulder, Dr. [name] for wrist)   Encounter Date: 03/03/2022   OT End of Session - 03/03/22 1323     Visit Number 72    Number of Visits 74    Date for OT Re-Evaluation 04/15/22    Authorization Time Period 14 additional visits approved on 01/31/22    Authorization - Visit Number 44    Authorization - Number of Visits 41    OT Start Time 1310    OT Stop Time 1355    OT Time Calculation (min) 45 min    Activity Tolerance Patient tolerated treatment well             Past Medical History:  Diagnosis Date   Hypertension    Seizure disorder ([place])     Past Surgical History:  Procedure Laterality Date   CHOLECYSTECTOMY     OPEN REDUCTION INTERNAL FIXATION (ORIF) DISTAL PHALANX Right 07/12/2021   Procedure: OPEN REDUCTION INTERNAL FIXATION (ORIF) DISTAL RADIUS;  Surgeon: Cindra Presume, MD;  Location: [facility];  Service: Plastics;  Laterality: Right;    There were no vitals filed for this visit.   Subjective Assessment - 03/03/22 1318     Subjective  "It's about the same"    Pertinent History 56 yo male with onset of fall at work off a ladder was brought to [facility] on 9/23 and received ORIF for R distal radial fracture on 9/26.  Mildly displaced R ulnar styloid fracture not requiring sx.  Received TLSO for spinal injuries of T12 comp fx, T11 anterior wedging, B inferior articular process and spinous process fractures.  PMHx:  HA, colloid cyst of brain, idiopathic intracranial hypertension, LE edema, lumbar spondylosis and radiculopathy, chestpain, HLD, seizure disorder.    Special Tests MRI of R shoulder results-Small partial-thickness midsubstance insertional  tear of the mid  to posterior supraspinatus tendon footprint. No tendon retraction.  2. Tiny partial-thickness tear within the mid to anterior  infraspinatus tendon in the region of subcortical cystic change  within the underlying humeral head.  3. Mild degenerative changes of the acromioclavicular joint.  4. Mild-to-moderate thinning of the glenoid and humeral head  cartilage.    Patient Stated Goals custom splint    Currently in Pain? Yes    Pain Score 3     Pain Location Hand   shoulder   Pain Orientation Right    Pain Descriptors / Indicators Aching    Pain Type Chronic pain    Pain Onset More than a month ago    Pain Frequency Constant    Aggravating Factors  malpositioning    Pain Relieving Factors tape, heat                            Fluidotherapy x 9 mins to right  hand while hotpack applied to right shoulder, no adverse reactions.  joint mobs to left hand and gentle passive stretch in finger flexion, prayer position for wrist ext. A/ROM wrist flexion and extension with finger flexion/ extension  Kinesiotape  remains in place over right shoulder applied to right shoulder, wrist piece was removed prior to fluido( Pt verbalized understanding of precautions and plans to remove on Saturday.Marland Kitchen  Pt reports increased wrist and forearm pain, therapist recommends pt reduces the amount of putty exercises and that pt only performs wrist strengthening every other day.  Supine with head elevated A/ROM closed chain shoulder flexion, AA/ROM shoulder flexion circumduction and gentle internal / external rotation. Seated AA/ROM  with UE ranger, min facilitation for shoulder positioning shoulder flexion,and circumduction then standing shoulder flexion and external rotation min v.c/ facilitation for positioning  Pt reports improved shoulder pain end of session MD only restricted use of overhead weights with  RUE due to recent MD which indicates supraspinatus and infraspinatus partial   tear.         OT Short Term Goals - 02/17/22 1518       OT SHORT TERM GOAL #1   Title -----    Status --      OT SHORT TERM GOAL #2   Title -------------    Status --      OT SHORT TERM GOAL #3   Title ------------    Status --      OT SHORT TERM GOAL #4   Title -------------    Period --    Status --      OT SHORT TERM GOAL #5   Title --------------    Status --      OT SHORT TERM GOAL #6   Title ---------------    Status --      OT SHORT TERM GOAL #7   Title --------------    Status --      OT SHORT TERM GOAL #8   Title ---------------    Status --      OT SHORT TERM GOAL  #9   TITLE Pt will report increased ease with cutting food and opening containers using RUE.    Time 4    Period Weeks    Status On-going   still difficulty with cutting food, sometime has difficulty with opening containers, difficulty using a can opener   Target Date 03/18/22      OT SHORT TERM GOAL  #10   TITLE Pt will be I with updated HEP to address right shoulder ROM    Time 4    Period Weeks    Status On-going   Met for s beginning HEP for shoulder, but pt can benefit from updates   Target Date 03/18/22      OT SHORT TERM GOAL  #11   TITLE Pt will demonstrate ability to retrieve a lightweight object at 110 shoulder flexion with pain no greater than 3/10    Time 4    Period Weeks    Status On-going   100 with pain 4/10   Target Date 01/21/22      OT SHORT TERM GOAL  #12   TITLE Pt will demonstrate 55* wrist extension for increased ease with ADLS.    Time 4    Period Weeks    Status On-going   50 wrist extension   Target Date --      OT SHORT TERM GOAL  #13   TITLE Pt will verbalize understanding of activity modification, and positioning for sleep to minimize shoulder pain    Time 4    Period Weeks    Status On-going   inital education provided, pt can benefit from ongoing reinforcement   Target Date --               OT Long Term Goals - 02/18/22 4970  OT LONG TERM GOAL #1   Title I with updated HEP.    Status On-going   met for hand-Pt has updated HEP for green putty   Target Date 02/18/22      OT LONG TERM GOAL #2   Title Pt will demonstrate RUE grip strength of 45 lbs or greater for increased RUE functional use.    Time 8    Period Weeks    Status On-going   41.5- 02/17/22   Target Date 04/15/22      OT LONG TERM GOAL #3   Title Pt will demonstrate wrist flexion/ extension WFLS for work activities.    Time 8    Period Weeks    Status Partially Met   wrist flexion/ extension:60/45-50- met for flexion only     OT LONG TERM GOAL #4   Title ----      OT LONG TERM GOAL #5   Title Pt will resume use of RUE at least 90% of the time for ADLS/ IADLS with pain no greater than 3/10.    Time 8    Period Weeks    Status On-going   uses 75%, with pain 5/10 at worst     OT LONG TERM GOAL #6   Title ---------------    Status --   unable to perfrom due to pain and back prec.     OT LONG TERM GOAL #7   Title Pt will demonstrate ability to retrieve a lightweight object at 120 shoulder flexion with pain less than or equal to 3/10.    Time 8    Period Weeks    Status On-going   100 with pain 4/10, 120 with pain 6/10   Target Date 02/18/22      OT LONG TERM GOAL #8   Title Pt will increase RUE tip, and 3 pt pinch to 8 lbs or greater for increased functional use of RUE.    Baseline Tip 4 lbs, 3pt 4 lbs    Time 8    Period Weeks    Status Achieved   tip 9lbs , 3pt 9 lbs   Target Date 02/18/22                   Plan - 03/04/22 1404     Clinical Impression Statement Pt is progressing towards goals. Pt demonstrates improving shoulder ROM and pain. Pt continues to be limited by hand pain and stiffness.    OT Occupational Profile and History Detailed Assessment- Review of Records and additional review of physical, cognitive, psychosocial history related to current functional performance    Occupational performance deficits  (Please refer to evaluation for details): ADL's;IADL's;Work;Social Participation;Rest and Sleep;Leisure    Body Structure / Function / Physical Skills ADL;UE functional use;Endurance;Balance;Flexibility;Pain;FMC;ROM;Coordination;GMC;Decreased knowledge of precautions;Sensation;Scar mobility;Decreased knowledge of use of DME;IADL;Skin integrity;Dexterity;Strength;Mobility;Edema    Rehab Potential Good    Clinical Decision Making Limited treatment options, no task modification necessary    Comorbidities Affecting Occupational Performance: May have comorbidities impacting occupational performance    Modification or Assistance to Complete Evaluation  Min-Moderate modification of tasks or assist with assess necessary to complete eval    OT Frequency 2x / week    OT Duration 8 weeks    OT Treatment/Interventions Self-care/ADL training;Ultrasound;Patient/family education;Scar mobilization;DME and/or AE instruction;Paraffin;Passive range of motion;Fluidtherapy;Cryotherapy;Functional Mobility Training;Splinting;Therapeutic activities;Manual Therapy;Moist Heat;Neuromuscular education    Plan continue to address R hand and shoulder ROM, kinesiotape, progress HEP as able    Consulted and Agree with Plan  of Care Patient             Patient will benefit from skilled therapeutic intervention in order to improve the following deficits and impairments:   Body Structure / Function / Physical Skills: ADL, UE functional use, Endurance, Balance, Flexibility, Pain, FMC, ROM, Coordination, GMC, Decreased knowledge of precautions, Sensation, Scar mobility, Decreased knowledge of use of DME, IADL, Skin integrity, Dexterity, Strength, Mobility, Edema       Visit Diagnosis: Chronic right shoulder pain  Stiffness of right shoulder, not elsewhere classified  Muscle weakness (generalized)  Other lack of coordination  Stiffness of right wrist, not elsewhere classified  Pain in right wrist  Localized  edema    Problem List Patient Active Problem List   Diagnosis Date Noted   Fall 07/09/2021   Arthritis, multiple joint involvement 09/24/2019   Class 1 obesity due to excess calories with serious comorbidity and body mass index (BMI) of 32.0 to 32.9 in adult 09/24/2019   Essential hypertension 09/24/2019   Mixed hyperlipidemia 09/24/2019   Restless legs syndrome 09/24/2019   Allergic rhinitis 11/25/2013   FHx: heart disease 11/25/2013    Shanielle Correll, OT 03/04/2022, 2:05 PM  [facility] [address] [place], [place], [address] Phone: [phone]   Fax:  [phone]  Name: Craig Church MRN: [id number] Date of Birth: Dec 05, 1965

## 2022-03-07 ENCOUNTER — Telehealth: Payer: Self-pay | Admitting: Orthopaedic Surgery

## 2022-03-07 NOTE — Telephone Encounter (Signed)
Patient called. Says he had a MRI done over a month ago and would like the results. His call back number is [phone]

## 2022-03-08 ENCOUNTER — Ambulatory Visit: Payer: Worker's Compensation | Admitting: Occupational Therapy

## 2022-03-08 DIAGNOSIS — M25611 Stiffness of right shoulder, not elsewhere classified: Secondary | ICD-10-CM

## 2022-03-08 DIAGNOSIS — R278 Other lack of coordination: Secondary | ICD-10-CM

## 2022-03-08 DIAGNOSIS — M6281 Muscle weakness (generalized): Secondary | ICD-10-CM

## 2022-03-08 DIAGNOSIS — M25631 Stiffness of right wrist, not elsewhere classified: Secondary | ICD-10-CM

## 2022-03-08 DIAGNOSIS — M25511 Pain in right shoulder: Secondary | ICD-10-CM | POA: Diagnosis not present

## 2022-03-08 DIAGNOSIS — G8929 Other chronic pain: Secondary | ICD-10-CM

## 2022-03-08 DIAGNOSIS — M25531 Pain in right wrist: Secondary | ICD-10-CM

## 2022-03-08 NOTE — Patient Instructions (Signed)
Strengthening: Isometric External Rotation    Using wall to provide resistance, and keeping right arm at side, press back of hand into ball using light pressure. Hold __5-10__ seconds. Repeat 5-10____ times per set. Do _1___ sets per session. Do ___1-2_ sessions per day.  http://orth.exer.us/814   Copyright  VHI. All rights reserved.  Internal Rotation (Isometric)    Place palm of right fist against door frame, with elbow bent. Press fist against door frame. Hold ___5-10_ seconds. Repeat __5-10__ times. Do __1-2__ sessions per day.  http://gt2.exer.us/107   Copyright  VHI. All rights reserved.

## 2022-03-09 NOTE — Therapy (Signed)
[facility] [address] [place], [place], [address] Phone: [phone]   Fax:  [phone]  Occupational Therapy Treatment  Patient Details  Name: Craig Church MRN: [id number] Date of Birth: 08/26/1966 Referring Provider (OT): Dr. Blackmon/Dr. Pace (Dr. Blackmon for shoulder, Dr. Pace for wrist)   Encounter Date: 03/08/2022   OT End of Session - 03/09/22 0822     Visit Number 49    Number of Visits 61    Date for OT Re-Evaluation 04/15/22    Authorization Time Period 14 additional visits approved on 01/31/22    Authorization - Visit Number 48    Authorization - Number of Visits 54    OT Start Time 1400    OT Stop Time 1445    OT Time Calculation (min) 45 min    Activity Tolerance Patient tolerated treatment well             Past Medical History:  Diagnosis Date   Hypertension    Seizure disorder (HCC)     Past Surgical History:  Procedure Laterality Date   CHOLECYSTECTOMY     OPEN REDUCTION INTERNAL FIXATION (ORIF) DISTAL PHALANX Right 07/12/2021   Procedure: OPEN REDUCTION INTERNAL FIXATION (ORIF) DISTAL RADIUS;  Surgeon: Pace, Collier S, MD;  Location: [facility];  Service: Plastics;  Laterality: Right;    There were no vitals filed for this visit.   Subjective Assessment - 03/09/22 0819     Subjective  Pt reports hand pain is 4/10 today    Pertinent History 56 yo male with onset of fall at work off a ladder was brought to hosp on 9/23 and received ORIF for R distal radial fracture on 9/26.  Mildly displaced R ulnar styloid fracture not requiring sx.  Received TLSO for spinal injuries of T12 comp fx, T11 anterior wedging, B inferior articular process and spinous process fractures.  PMHx:  HA, colloid cyst of brain, idiopathic intracranial hypertension, LE edema, lumbar spondylosis and radiculopathy, chestpain, HLD, seizure disorder.    Limitations Back precuations from recent back surgery 12/03/21, no lifting  over 15 lbs, continues tx for R shoulder and hand pain, rotator cuff tear per MRI,await clarification of restrictions Per Dr. Blackman- no excessive use of overhead weights to right shoulder    Special Tests MRI of R shoulder results-Small partial-thickness midsubstance insertional tear of the mid  to posterior supraspinatus tendon footprint. No tendon retraction.  2. Tiny partial-thickness tear within the mid to anterior  infraspinatus tendon in the region of subcortical cystic change  within the underlying humeral head.  3. Mild degenerative changes of the acromioclavicular joint.  4. Mild-to-moderate thinning of the glenoid and humeral head  cartilage.    Currently in Pain? Yes    Pain Score 4     Pain Location Hand    Pain Orientation Right    Pain Descriptors / Indicators Aching    Pain Type Chronic pain    Pain Onset More than a month ago    Pain Frequency Intermittent    Aggravating Factors  malpositioning    Pain Relieving Factors heat    Multiple Pain Sites Yes    Pain Score 3    Pain Location Shoulder    Pain Orientation Right    Pain Descriptors / Indicators Aching    Pain Type Chronic pain    Pain Onset More than a month ago    Pain Frequency Intermittent    Aggravating Factors  malpostioning      Pain Relieving Factors repositioning, tape                    [place] to right hand3 mhz, 0.8 w/cm 2, 20 % x 8 mins , while hotpack applied to right shoulder, no adverse reactions.  Tendon gliding exercises and A/ROM composite flexion/ extension Therapist requested pt holds off on putty and strengthening exercises for wrist to see if pain in hand  subsides  A/ROM wrist flexion and extension with finger flexion/ extension  Kinesiotape applied to right shoulder applied to with Y piece  and strip to relax traps.  Seated A/ROM closed chain shoulder flexion, gentle internal / external rotation with cane Standing in doorway, pt performed isometric internal and external rotation  with no increased pain, therapist issued for home. Pt reports improved shoulder pain end of session MD only restricted use of overhead weights with  RUE due to recent MD which indicates supraspinatus and infraspinatus partial  tear.                OT Education - 03/09/22 0840     Education Details isometric exercises for internal/ external rotation, flipping dealing and flicking cards with RUE for functional use.    Person(s) Educated Patient    Methods Explanation;Demonstration;Verbal cues;Handout    Comprehension Verbalized understanding;Returned demonstration              OT Short Term Goals - 02/17/22 1518       OT SHORT TERM GOAL #1   Title -----    Status --      OT SHORT TERM GOAL #2   Title -------------    Status --      OT SHORT TERM GOAL #3   Title ------------    Status --      OT SHORT TERM GOAL #4   Title -------------    Period --    Status --      OT SHORT TERM GOAL #5   Title --------------    Status --      OT SHORT TERM GOAL #6   Title ---------------    Status --      OT SHORT TERM GOAL #7   Title --------------    Status --      OT SHORT TERM GOAL #8   Title ---------------    Status --      OT SHORT TERM GOAL  #9   TITLE Pt will report increased ease with cutting food and opening containers using RUE.    Time 4    Period Weeks    Status On-going   still difficulty with cutting food, sometime has difficulty with opening containers, difficulty using a can opener   Target Date 03/18/22      OT SHORT TERM GOAL  #10   TITLE Pt will be I with updated HEP to address right shoulder ROM    Time 4    Period Weeks    Status On-going   Met for s beginning HEP for shoulder, but pt can benefit from updates   Target Date 03/18/22      OT SHORT TERM GOAL  #11   TITLE Pt will demonstrate ability to retrieve a lightweight object at 110 shoulder flexion with pain no greater than 3/10    Time 4    Period Weeks    Status On-going    100 with pain 4/10   Target Date 01/21/22      OT SHORT TERM GOAL  #  12   TITLE Pt will demonstrate 55* wrist extension for increased ease with ADLS.    Time 4    Period Weeks    Status On-going   50 wrist extension   Target Date --      OT SHORT TERM GOAL  #13   TITLE Pt will verbalize understanding of activity modification, and positioning for sleep to minimize shoulder pain    Time 4    Period Weeks    Status On-going   inital education provided, pt can benefit from ongoing reinforcement   Target Date --               OT Long Term Goals - 02/18/22 3149       OT LONG TERM GOAL #1   Title I with updated HEP.    Status On-going   met for hand-Pt has updated HEP for green putty   Target Date 02/18/22      OT LONG TERM GOAL #2   Title Pt will demonstrate RUE grip strength of 45 lbs or greater for increased RUE functional use.    Time 8    Period Weeks    Status On-going   41.5- 02/17/22   Target Date 04/15/22      OT LONG TERM GOAL #3   Title Pt will demonstrate wrist flexion/ extension WFLS for work activities.    Time 8    Period Weeks    Status Partially Met   wrist flexion/ extension:60/45-50- met for flexion only     OT LONG TERM GOAL #4   Title ----      OT LONG TERM GOAL #5   Title Pt will resume use of RUE at least 90% of the time for ADLS/ IADLS with pain no greater than 3/10.    Time 8    Period Weeks    Status On-going   uses 75%, with pain 5/10 at worst     OT LONG TERM GOAL #6   Title ---------------    Status --   unable to perfrom due to pain and back prec.     OT LONG TERM GOAL #7   Title Pt will demonstrate ability to retrieve a lightweight object at 120 shoulder flexion with pain less than or equal to 3/10.    Time 8    Period Weeks    Status On-going   100 with pain 4/10, 120 with pain 6/10   Target Date 02/18/22      OT LONG TERM GOAL #8   Title Pt will increase RUE tip, and 3 pt pinch to 8 lbs or greater for increased functional use of  RUE.    Baseline Tip 4 lbs, 3pt 4 lbs    Time 8    Period Weeks    Status Achieved   tip 9lbs , 3pt 9 lbs   Target Date 02/18/22                   Plan - 03/09/22 0834     Clinical Impression Statement Pt is progressing towards goals. Pt demonstrates improving shoulder ROM and pain. Pt continues to be limited by hand pain and stiffness. therapist has recommended that pt takes a week break from resited exercises to hand and wrist to see if swelling and pain improves. Pt was instructed to continue functional use of hand and gentle A/ROM.    OT Occupational Profile and History Detailed Assessment- Review of Records and additional review of physical,  cognitive, psychosocial history related to current functional performance    Occupational performance deficits (Please refer to evaluation for details): ADL's;IADL's;Work;Social Participation;Rest and Sleep;Leisure    Body Structure / Function / Physical Skills ADL;UE functional use;Endurance;Balance;Flexibility;Pain;FMC;ROM;Coordination;GMC;Decreased knowledge of precautions;Sensation;Scar mobility;Decreased knowledge of use of DME;IADL;Skin integrity;Dexterity;Strength;Mobility;Edema    Rehab Potential Good    Clinical Decision Making Limited treatment options, no task modification necessary    Comorbidities Affecting Occupational Performance: May have comorbidities impacting occupational performance    Modification or Assistance to Complete Evaluation  Min-Moderate modification of tasks or assist with assess necessary to complete eval    OT Frequency 2x / week    OT Duration 8 weeks    OT Treatment/Interventions Self-care/ADL training;Ultrasound;Patient/family education;Scar mobilization;DME and/or AE instruction;Paraffin;Passive range of motion;Fluidtherapy;Cryotherapy;Functional Mobility Training;Splinting;Therapeutic activities;Manual Therapy;Moist Heat;Neuromuscular education    Plan continue to address R hand and shoulder ROM,  kinesiotape, progress HEP as able, progress isometrics for shoulder    Consulted and Agree with Plan of Care Patient             Patient will benefit from skilled therapeutic intervention in order to improve the following deficits and impairments:   Body Structure / Function / Physical Skills: ADL, UE functional use, Endurance, Balance, Flexibility, Pain, FMC, ROM, Coordination, GMC, Decreased knowledge of precautions, Sensation, Scar mobility, Decreased knowledge of use of DME, IADL, Skin integrity, Dexterity, Strength, Mobility, Edema       Visit Diagnosis: Chronic right shoulder pain  Stiffness of right shoulder, not elsewhere classified  Muscle weakness (generalized)  Other lack of coordination  Stiffness of right wrist, not elsewhere classified  Pain in right wrist    Problem List Patient Active Problem List   Diagnosis Date Noted   Fall 07/09/2021   Arthritis, multiple joint involvement 09/24/2019   Class 1 obesity due to excess calories with serious comorbidity and body mass index (BMI) of 32.0 to 32.9 in adult 09/24/2019   Essential hypertension 09/24/2019   Mixed hyperlipidemia 09/24/2019   Restless legs syndrome 09/24/2019   Allergic rhinitis 11/25/2013   FHx: heart disease 11/25/2013    RINE,KATHRYN, OT 03/09/2022, 8:45 AM  [facility] [address] [place], [place], [address] Phone: [phone]   Fax:  [phone]  Name: Lui Fukuda MRN: [id number] Date of Birth: 05/12/1966  

## 2022-03-15 ENCOUNTER — Ambulatory Visit: Payer: Worker's Compensation | Admitting: Occupational Therapy

## 2022-03-15 DIAGNOSIS — M25531 Pain in right wrist: Secondary | ICD-10-CM

## 2022-03-15 DIAGNOSIS — M25511 Pain in right shoulder: Secondary | ICD-10-CM | POA: Diagnosis not present

## 2022-03-15 DIAGNOSIS — M6281 Muscle weakness (generalized): Secondary | ICD-10-CM

## 2022-03-15 DIAGNOSIS — M25631 Stiffness of right wrist, not elsewhere classified: Secondary | ICD-10-CM

## 2022-03-15 DIAGNOSIS — R278 Other lack of coordination: Secondary | ICD-10-CM

## 2022-03-15 DIAGNOSIS — G8929 Other chronic pain: Secondary | ICD-10-CM

## 2022-03-15 DIAGNOSIS — M25611 Stiffness of right shoulder, not elsewhere classified: Secondary | ICD-10-CM

## 2022-03-16 NOTE — Therapy (Signed)
[facility] [address] [place], [place], [address] Phone: [phone]   Fax:  [phone]  Occupational Therapy Treatment  Patient Details  Name: Craig Church MRN: [id number] Date of Birth: [birth year] Referring Provider (OT): Dr. Omari Lagos. [name] (Dr. Ninfa Linden for shoulder, Dr. [name] for wrist)   Encounter Date: 03/15/2022   OT End of Session - 03/16/22 0749     Visit Number 50    Number of Visits 76    Date for OT Re-Evaluation 04/15/22    Authorization Time Period 14 additional visits approved on 01/31/22    Authorization - Visit Number 14    Authorization - Number of Visits 71    OT Start Time 0964    OT Stop Time 1400    OT Time Calculation (min) 43 min             Past Medical History:  Diagnosis Date   Hypertension    Seizure disorder ([facility])     Past Surgical History:  Procedure Laterality Date   CHOLECYSTECTOMY     OPEN REDUCTION INTERNAL FIXATION (ORIF) DISTAL PHALANX Right 07/12/2021   Procedure: OPEN REDUCTION INTERNAL FIXATION (ORIF) DISTAL RADIUS;  Surgeon: Cindra Presume, MD;  Location: [facility];  Service: Plastics;  Laterality: Right;    There were no vitals filed for this visit.               Treatment:Fluidotherapy to right hand x 10 mins while hotpack on right shoulder , no adverse reactions. Tendon gliding exercises and A/ROM composite flexion/ extension A/ROM wrist flexion and extension with finger flexion/ extension, passive wrist extension  Kinesiotape applied to right shoulder applied to with Y piece  and strip to relax traps.  Seated A/ROM/ AA/ROM closed chain shoulder flexion seated to approx 90* , gentle internal / external rotation with cane min v.c, pt attempted shoulder abduction however he was unable due to pain Seated pt was instructed in yellow theraband exercises internal / external rotation, shoulder extension, min v.c / demonstration  MD only restricted use of  overhead weights with  RUE due to recent MD which indicates supraspinatus and infraspinatus partial  tear.        OT Education - 03/16/22 0750     Education Details yellow theraband exercises for internal, external rotation and shoulder extension- see pt instructions    Person(s) Educated Patient    Methods Explanation;Demonstration;Verbal cues;Handout    Comprehension Verbalized understanding;Returned demonstration;Verbal cues required              OT Short Term Goals - 02/17/22 1518       OT SHORT TERM GOAL #1   Title -----    Status --      OT SHORT TERM GOAL #2   Title -------------    Status --      OT SHORT TERM GOAL #3   Title ------------    Status --      OT SHORT TERM GOAL #4   Title -------------    Period --    Status --      OT SHORT TERM GOAL #5   Title --------------    Status --      OT SHORT TERM GOAL #6   Title ---------------    Status --      OT SHORT TERM GOAL #7   Title --------------    Status --      OT SHORT TERM GOAL #8   Title ---------------  Status --      OT SHORT TERM GOAL  #9   TITLE Pt will report increased ease with cutting food and opening containers using RUE.    Time 4    Period Weeks    Status On-going   still difficulty with cutting food, sometime has difficulty with opening containers, difficulty using a can opener   Target Date 03/18/22      OT SHORT TERM GOAL  #10   TITLE Pt will be I with updated HEP to address right shoulder ROM    Time 4    Period Weeks    Status On-going   Met for s beginning HEP for shoulder, but pt can benefit from updates   Target Date 03/18/22      OT SHORT TERM GOAL  #11   TITLE Pt will demonstrate ability to retrieve a lightweight object at 110 shoulder flexion with pain no greater than 3/10    Time 4    Period Weeks    Status On-going   100 with pain 4/10   Target Date 01/21/22      OT SHORT TERM GOAL  #12   TITLE Pt will demonstrate 55* wrist extension for increased  ease with ADLS.    Time 4    Period Weeks    Status On-going   50 wrist extension   Target Date --      OT SHORT TERM GOAL  #13   TITLE Pt will verbalize understanding of activity modification, and positioning for sleep to minimize shoulder pain    Time 4    Period Weeks    Status On-going   inital education provided, pt can benefit from ongoing reinforcement   Target Date --               OT Long Term Goals - 02/18/22 7371       OT LONG TERM GOAL #1   Title I with updated HEP.    Status On-going   met for hand-Pt has updated HEP for green putty   Target Date 02/18/22      OT LONG TERM GOAL #2   Title Pt will demonstrate RUE grip strength of 45 lbs or greater for increased RUE functional use.    Time 8    Period Weeks    Status On-going   41.5- 02/17/22   Target Date 04/15/22      OT LONG TERM GOAL #3   Title Pt will demonstrate wrist flexion/ extension WFLS for work activities.    Time 8    Period Weeks    Status Partially Met   wrist flexion/ extension:60/45-50- met for flexion only     OT LONG TERM GOAL #4   Title ----      OT LONG TERM GOAL #5   Title Pt will resume use of RUE at least 90% of the time for ADLS/ IADLS with pain no greater than 3/10.    Time 8    Period Weeks    Status On-going   uses 75%, with pain 5/10 at worst     OT LONG TERM GOAL #6   Title ---------------    Status --   unable to perfrom due to pain and back prec.     OT LONG TERM GOAL #7   Title Pt will demonstrate ability to retrieve a lightweight object at 120 shoulder flexion with pain less than or equal to 3/10.    Time 8  Period Weeks    Status On-going   100 with pain 4/10, 120 with pain 6/10   Target Date 02/18/22      OT LONG TERM GOAL #8   Title Pt will increase RUE tip, and 3 pt pinch to 8 lbs or greater for increased functional use of RUE.    Baseline Tip 4 lbs, 3pt 4 lbs    Time 8    Period Weeks    Status Achieved   tip 9lbs , 3pt 9 lbs   Target Date 02/18/22                    Plan - 03/16/22 0753     Clinical Impression Statement Pt demonstrates progress towards goals with improved shoulder ROM and pt has been tolerating isometrics well    OT Occupational Profile and History Detailed Assessment- Review of Records and additional review of physical, cognitive, psychosocial history related to current functional performance    Occupational performance deficits (Please refer to evaluation for details): ADL's;IADL's;Work;Social Participation;Rest and Sleep;Leisure    Body Structure / Function / Physical Skills ADL;UE functional use;Endurance;Balance;Flexibility;Pain;FMC;ROM;Coordination;GMC;Decreased knowledge of precautions;Sensation;Scar mobility;Decreased knowledge of use of DME;IADL;Skin integrity;Dexterity;Strength;Mobility;Edema    Rehab Potential Good    Clinical Decision Making Limited treatment options, no task modification necessary    Comorbidities Affecting Occupational Performance: May have comorbidities impacting occupational performance    Modification or Assistance to Complete Evaluation  Min-Moderate modification of tasks or assist with assess necessary to complete eval    OT Frequency 2x / week    OT Duration 8 weeks    OT Treatment/Interventions Self-care/ADL training;Ultrasound;Patient/family education;Scar mobilization;DME and/or AE instruction;Paraffin;Passive range of motion;Fluidtherapy;Cryotherapy;Functional Mobility Training;Splinting;Therapeutic activities;Manual Therapy;Moist Heat;Neuromuscular education    Plan continue to address R hand and shoulder ROM, kinesiotape, progress HEP as able, progress isometrics for shoulder    Consulted and Agree with Plan of Care Patient             Patient will benefit from skilled therapeutic intervention in order to improve the following deficits and impairments:   Body Structure / Function / Physical Skills: ADL, UE functional use, Endurance, Balance, Flexibility, Pain, FMC,  ROM, Coordination, GMC, Decreased knowledge of precautions, Sensation, Scar mobility, Decreased knowledge of use of DME, IADL, Skin integrity, Dexterity, Strength, Mobility, Edema       Visit Diagnosis: Chronic right shoulder pain  Stiffness of right shoulder, not elsewhere classified  Muscle weakness (generalized)  Other lack of coordination  Stiffness of right wrist, not elsewhere classified  Pain in right wrist    Problem List Patient Active Problem List   Diagnosis Date Noted   Fall 07/09/2021   Arthritis, multiple joint involvement 09/24/2019   Class 1 obesity due to excess calories with serious comorbidity and body mass index (BMI) of 32.0 to 32.9 in adult 09/24/2019   Essential hypertension 09/24/2019   Mixed hyperlipidemia 09/24/2019   Restless legs syndrome 09/24/2019   Allergic rhinitis 11/25/2013   FHx: heart disease 11/25/2013    Gaile Allmon, OT 03/16/2022, 12:53 PM  [facility] [address] [place], [place], [address] Phone: [phone]   Fax:  [phone]  Name: [facility] Wherley MRN: [id number] Date of Birth: 09-Nov-1965

## 2022-03-17 ENCOUNTER — Ambulatory Visit: Payer: Worker's Compensation | Attending: Orthopaedic Surgery | Admitting: Occupational Therapy

## 2022-03-17 DIAGNOSIS — M25531 Pain in right wrist: Secondary | ICD-10-CM | POA: Diagnosis present

## 2022-03-17 DIAGNOSIS — M25631 Stiffness of right wrist, not elsewhere classified: Secondary | ICD-10-CM | POA: Insufficient documentation

## 2022-03-17 DIAGNOSIS — R600 Localized edema: Secondary | ICD-10-CM | POA: Diagnosis present

## 2022-03-17 DIAGNOSIS — M25511 Pain in right shoulder: Secondary | ICD-10-CM | POA: Insufficient documentation

## 2022-03-17 DIAGNOSIS — G8929 Other chronic pain: Secondary | ICD-10-CM | POA: Diagnosis present

## 2022-03-17 DIAGNOSIS — M6281 Muscle weakness (generalized): Secondary | ICD-10-CM | POA: Insufficient documentation

## 2022-03-17 DIAGNOSIS — R278 Other lack of coordination: Secondary | ICD-10-CM | POA: Insufficient documentation

## 2022-03-18 NOTE — Therapy (Signed)
[place] [address] [place], [place], [address] Phone: [phone]   Fax:  [phone]  Occupational Therapy Treatment  Patient Details  Name: [place] [facility] MRN: [id number] Date of Birth: Aug 15, 1966 Referring Provider (OT): Dr. Erique Lagos. [name] (Dr. Ninfa Linden for shoulder, Dr. [name] for wrist)   Encounter Date: 03/17/2022   OT End of Session - 03/18/22 0807     Visit Number 6    Number of Visits 1    Date for OT Re-Evaluation 04/15/22    Authorization Time Period 14 additional visits approved on 01/31/22    Authorization - Visit Number 17    Authorization - Number of Visits 25    OT Start Time 1350    OT Stop Time 1445    OT Time Calculation (min) 55 min             Past Medical History:  Diagnosis Date   Hypertension    Seizure disorder ([place])     Past Surgical History:  Procedure Laterality Date   CHOLECYSTECTOMY     OPEN REDUCTION INTERNAL FIXATION (ORIF) DISTAL PHALANX Right 07/12/2021   Procedure: OPEN REDUCTION INTERNAL FIXATION (ORIF) DISTAL RADIUS;  Surgeon: Cindra Presume, MD;  Location: [facility];  Service: Plastics;  Laterality: Right;    There were no vitals filed for this visit.   Subjective Assessment - 03/18/22 0818     Subjective  Pt reports he had significant pain the day after tharaband exercises    Pertinent History 56 yo male with onset of fall at work off a ladder was brought to [place] on 9/23 and received ORIF for R distal radial fracture on 9/26.  Mildly displaced R ulnar styloid fracture not requiring sx.  Received TLSO for spinal injuries of T12 comp fx, T11 anterior wedging, B inferior articular process and spinous process fractures.  PMHx:  HA, colloid cyst of brain, idiopathic intracranial hypertension, LE edema, lumbar spondylosis and radiculopathy, chestpain, HLD, seizure disorder.    Limitations Back precuations from recent back surgery 12/03/21, no lifting over 15 lbs, continues tx  for R shoulder and hand pain, rotator cuff tear per MRI,await clarification of restrictions Per Dr. Ninfa Linden- no excessive use of overhead weights to right shoulder    Special Tests MRI of R shoulder results-Small partial-thickness midsubstance insertional tear of the mid  to posterior supraspinatus tendon footprint. No tendon retraction.  2. Tiny partial-thickness tear within the mid to anterior  infraspinatus tendon in the region of subcortical cystic change  within the underlying humeral head.  3. Mild degenerative changes of the acromioclavicular joint.  4. Mild-to-moderate thinning of the glenoid and humeral head  cartilage.    Patient Stated Goals custom splint    Currently in Pain? Yes    Pain Score 5    however pt reports pain was 7-8/10 yesterday   Pain Location Shoulder    Pain Orientation Right    Pain Descriptors / Indicators Aching    Pain Type Acute pain    Pain Onset More than a month ago    Pain Frequency Intermittent    Aggravating Factors  overuse, exercise    Pain Relieving Factors heat, [place], kinesiotape    Pain Score 3    Pain Location Hand    Pain Orientation Right    Pain Descriptors / Indicators Aching    Pain Type Chronic pain    Pain Onset More than a month ago    Pain Frequency Intermittent  Aggravating Factors  unknown    Pain Relieving Factors heat                Treatment:US to right shoulder 1 mhz, 0.8 w/cm 2, continuous x 8 mins , while paraffin applied to right hand no adverse reactions Supine gentle shoulder mobs in  shoulder/ scapular retraction Supine on with head elevated closed chain chest press then shoulder flexion, AA/ROM shoulder flexion with facilitation at shoulder then gentle circumduction Pt's shoulder began to spasm so discontinued. Seated closed chain shoulder flexion, with cane,  AA/ROM using hemiglide for shoulder flexion, circumduction, min facilitation, v.c Pt attempted isometric external rotation of RUE however he began to  spasm so this was discontinued. Kinesiotape was applied to right shoulder with Y strip to support upper arm, shoulder and additional strip to relax traps. Pt reports that the tape helps his shoulder pain. Pt reports pain at end of session is 1/10 for right shoulder and decreased for hand as well. Pt was instructed to avoid isometric shoulder exercises, and theraband until next visit, pt sees MD Monday.  Pt was instructed to limit hand and wrist strengthening to every other day.  R Shoulder A/ROM: R shoulder flexion: 100 prior to significant pain,  shoulder abduction: 70 prior to significant pain,  external rotation: 30*,  internal rotation:WFL  Discussion regarding holding off on therapy for hand and wrist until pt receives a second opinion pt's caseworker is arranging second opinion for hand. Pt is in agreement.                  OT Short Term Goals - 02/17/22 1518       OT SHORT TERM GOAL #1   Title -----    Status --      OT SHORT TERM GOAL #2   Title -------------    Status --      OT SHORT TERM GOAL #3   Title ------------    Status --      OT SHORT TERM GOAL #4   Title -------------    Period --    Status --      OT SHORT TERM GOAL #5   Title --------------    Status --      OT SHORT TERM GOAL #6   Title ---------------    Status --      OT SHORT TERM GOAL #7   Title --------------    Status --      OT SHORT TERM GOAL #8   Title ---------------    Status --      OT SHORT TERM GOAL  #9   TITLE Pt will report increased ease with cutting food and opening containers using RUE.    Time 4    Period Weeks    Status On-going   still difficulty with cutting food, sometime has difficulty with opening containers, difficulty using a can opener   Target Date 03/18/22      OT SHORT TERM GOAL  #10   TITLE Pt will be I with updated HEP to address right shoulder ROM    Time 4    Period Weeks    Status On-going   Met for s beginning HEP for shoulder, but  pt can benefit from updates   Target Date 03/18/22      OT SHORT TERM GOAL  #11   TITLE Pt will demonstrate ability to retrieve a lightweight object at 110 shoulder flexion with pain no greater than 3/10      Time 4    Period Weeks    Status On-going   100 with pain 4/10   Target Date 01/21/22      OT SHORT TERM GOAL  #12   TITLE Pt will demonstrate 55* wrist extension for increased ease with ADLS.    Time 4    Period Weeks    Status On-going   50 wrist extension   Target Date --      OT SHORT TERM GOAL  #13   TITLE Pt will verbalize understanding of activity modification, and positioning for sleep to minimize shoulder pain    Time 4    Period Weeks    Status On-going   inital education provided, pt can benefit from ongoing reinforcement   Target Date --               OT Long Term Goals - 02/18/22 2751       OT LONG TERM GOAL #1   Title I with updated HEP.    Status On-going   met for hand-Pt has updated HEP for green putty   Target Date 02/18/22      OT LONG TERM GOAL #2   Title Pt will demonstrate RUE grip strength of 45 lbs or greater for increased RUE functional use.    Time 8    Period Weeks    Status On-going   41.5- 02/17/22   Target Date 04/15/22      OT LONG TERM GOAL #3   Title Pt will demonstrate wrist flexion/ extension WFLS for work activities.    Time 8    Period Weeks    Status Partially Met   wrist flexion/ extension:60/45-50- met for flexion only     OT LONG TERM GOAL #4   Title ----      OT LONG TERM GOAL #5   Title Pt will resume use of RUE at least 90% of the time for ADLS/ IADLS with pain no greater than 3/10.    Time 8    Period Weeks    Status On-going   uses 75%, with pain 5/10 at worst     OT LONG TERM GOAL #6   Title ---------------    Status --   unable to perfrom due to pain and back prec.     OT LONG TERM GOAL #7   Title Pt will demonstrate ability to retrieve a lightweight object at 120 shoulder flexion with pain less than or  equal to 3/10.    Time 8    Period Weeks    Status On-going   100 with pain 4/10, 120 with pain 6/10   Target Date 02/18/22      OT LONG TERM GOAL #8   Title Pt will increase RUE tip, and 3 pt pinch to 8 lbs or greater for increased functional use of RUE.    Baseline Tip 4 lbs, 3pt 4 lbs    Time 8    Period Weeks    Status Achieved   tip 9lbs , 3pt 9 lbs   Target Date 02/18/22                   Plan - 03/18/22 7001     Clinical Impression Statement Pt returns to therapy today after performing low range theraband on Tuesday. Pt reports he experienced significant pain yseterday. Pt was instucted to hold off on isometrics and theraband until next visit.    OT Occupational Profile and History Detailed Assessment-  Review of Records and additional review of physical, cognitive, psychosocial history related to current functional performance    Occupational performance deficits (Please refer to evaluation for details): ADL's;IADL's;Work;Social Participation;Rest and Sleep;Leisure    Body Structure / Function / Physical Skills ADL;UE functional use;Endurance;Balance;Flexibility;Pain;FMC;ROM;Coordination;GMC;Decreased knowledge of precautions;Sensation;Scar mobility;Decreased knowledge of use of DME;IADL;Skin integrity;Dexterity;Strength;Mobility;Edema    Rehab Potential Good    Clinical Decision Making Limited treatment options, no task modification necessary    Comorbidities Affecting Occupational Performance: May have comorbidities impacting occupational performance    Modification or Assistance to Complete Evaluation  Min-Moderate modification of tasks or assist with assess necessary to complete eval    OT Frequency 2x / week    OT Duration 8 weeks    OT Treatment/Interventions Self-care/ADL training;Ultrasound;Patient/family education;Scar mobilization;DME and/or AE instruction;Paraffin;Passive range of motion;Fluidtherapy;Cryotherapy;Functional Mobility Training;Splinting;Therapeutic  activities;Manual Therapy;Moist Heat;Neuromuscular education    Plan progress as able with RUE shoulder ROM and strengthening per MD advice, plan to discontinue therapy for hand until pt pt has second opinion    Consulted and Agree with Plan of Care Patient             Patient will benefit from skilled therapeutic intervention in order to improve the following deficits and impairments:   Body Structure / Function / Physical Skills: ADL, UE functional use, Endurance, Balance, Flexibility, Pain, FMC, ROM, Coordination, GMC, Decreased knowledge of precautions, Sensation, Scar mobility, Decreased knowledge of use of DME, IADL, Skin integrity, Dexterity, Strength, Mobility, Edema       Visit Diagnosis: Chronic right shoulder pain  Muscle weakness (generalized)  Other lack of coordination  Stiffness of right wrist, not elsewhere classified  Pain in right wrist  Localized edema    Problem List Patient Active Problem List   Diagnosis Date Noted   Fall 07/09/2021   Arthritis, multiple joint involvement 09/24/2019   Class 1 obesity due to excess calories with serious comorbidity and body mass index (BMI) of 32.0 to 32.9 in adult 09/24/2019   Essential hypertension 09/24/2019   Mixed hyperlipidemia 09/24/2019   Restless legs syndrome 09/24/2019   Allergic rhinitis 11/25/2013   FHx: heart disease 11/25/2013    RINE,KATHRYN, OT 03/18/2022, 8:24 AM  [facility] [address] [place], [place], [address] Phone: [phone]   Fax:  [phone]  Name: [place] MRN: [id number] Date of Birth: 09/20/1966  

## 2022-03-21 ENCOUNTER — Ambulatory Visit (INDEPENDENT_AMBULATORY_CARE_PROVIDER_SITE_OTHER): Payer: Worker's Compensation | Admitting: Physician Assistant

## 2022-03-21 ENCOUNTER — Encounter: Payer: Self-pay | Admitting: Physician Assistant

## 2022-03-21 DIAGNOSIS — S46011D Strain of muscle(s) and tendon(s) of the rotator cuff of right shoulder, subsequent encounter: Secondary | ICD-10-CM | POA: Diagnosis not present

## 2022-03-21 DIAGNOSIS — M7541 Impingement syndrome of right shoulder: Secondary | ICD-10-CM

## 2022-03-21 NOTE — Progress Notes (Signed)
HPI: [name] returns today to go over the MRI of his right shoulder.  He has had no change in pain in the shoulder.  Again his pain started after he fell in September 20 20 to 23 feet and sustained a right wrist fracture T12 compression fracture T11 anterior wedging and T11 spinous process fracture.  He has had right shoulder pain since the injury. MRI images were reviewed with the patient.  MRI of the right shoulder dated 02/14/2022 show small partial-thickness mid substance insertional tear of the supraspinatus tendon.  Tiny partial-thickness tear infraspinatus tendon.  Mild to moderate glenohumeral joint arthritic changes.  Mild AC joint changes.  Downsloping acromion.  Physical exam: Right shoulder positive impingement testing.  Painful active forward flexion with full overhead motion.  Impression: Right shoulder impingement  Plan: Given patient's failure of conservative treatment which is included time injections therapy and continued shoulder pain recommend right shoulder arthroscopy with arthroscopic debridement and subacromial decompression.  Discussed with him postoperative protocol.  Questions were encouraged and answered by Dr. Magnus Ivan and myself.

## 2022-03-22 ENCOUNTER — Ambulatory Visit: Payer: Worker's Compensation | Attending: Family Medicine | Admitting: Occupational Therapy

## 2022-03-22 DIAGNOSIS — M6281 Muscle weakness (generalized): Secondary | ICD-10-CM | POA: Diagnosis present

## 2022-03-22 DIAGNOSIS — R278 Other lack of coordination: Secondary | ICD-10-CM | POA: Insufficient documentation

## 2022-03-22 DIAGNOSIS — G8929 Other chronic pain: Secondary | ICD-10-CM | POA: Insufficient documentation

## 2022-03-22 DIAGNOSIS — M25531 Pain in right wrist: Secondary | ICD-10-CM | POA: Diagnosis present

## 2022-03-22 DIAGNOSIS — M25611 Stiffness of right shoulder, not elsewhere classified: Secondary | ICD-10-CM | POA: Insufficient documentation

## 2022-03-22 DIAGNOSIS — M25511 Pain in right shoulder: Secondary | ICD-10-CM | POA: Insufficient documentation

## 2022-03-22 DIAGNOSIS — R600 Localized edema: Secondary | ICD-10-CM | POA: Insufficient documentation

## 2022-03-22 DIAGNOSIS — M25631 Stiffness of right wrist, not elsewhere classified: Secondary | ICD-10-CM | POA: Diagnosis present

## 2022-03-22 NOTE — Patient Instructions (Addendum)
Kinesiotape application-  Place the base of the Y strip on upper lateral arm, give a gentle stretch to the Y strip one piece goes in front of the shoulder and wraps up to the top a second piece wraps behind shoulder to top, gentle stretch except for the tail  Then place a straight strip in the middle of the other strip gentle stretch,  stopping just before your neck.            SHOULDER: Flexion - Sitting    Hold cane with both hands. Raise arms up. Keep elbows straight. Hold _5__ seconds. No weight on cane 10___ reps per set, _1-2__ sets per day, __7_ days per week  Copyright  [employer]. All rights reserved.    .         Active Assistive Shoulder External Rotation    SIT with stick at waist level,right palm up, other palm down.  sit push forearm out from body with hand palm down, and keep elbows bent. Do not force the motion, only within pain free range. Perform 10 reps. 2X DAY

## 2022-03-23 ENCOUNTER — Encounter: Payer: Self-pay | Admitting: Occupational Therapy

## 2022-03-23 NOTE — Therapy (Addendum)
[facility] [address] [place], [place], [address] Phone: [phone]   Fax:  [phone]  Occupational Therapy Treatment  Patient Details  Name: Craig Church MRN: [id number] Date of Birth: September 27, 1966 Referring Provider (OT): Dr. Aloysius Lagos. [name] (Dr. Ninfa Linden for shoulder, Dr. [name] for wrist)   Encounter Date: 03/22/2022   OCCUPATIONAL THERAPY DISCHARGE SUMMARY    Current functional level related to goals / functional outcomes:   Pt made some overall progress, for R shoulder and hand but he was limited by pain and stiffness.  Pt. progress to R hand has plateaued.Patient will continue to perform A/ROM and strengthening exercises to hand and wrist at home.  Pt saw Dr. Ninfa Linden and he will be undergoing shoulder surgery as he has not been able to fully progress with therapy. Pt will continue gentle A/ROM, AA/ROM to the shoulder at home until he undergoes surgery. Therapist recommends that pt receives post surgical PT at ortho care.   Remaining deficits: Pain and swelling in R hand,, decreased strength, decreased RUE functional use, pain, decreased shoulder ROM and strength   Education / Equipment: Pt has been educated in exercises for R hand and shoulder. Pt has been educated in shoulder positioning to minimize pain and and risk for injury.   Patient agrees to discharge. Patient goals were partially met. Patient is being discharged due to maximized rehab potential. and upcoming shoulder surgery.      OT End of Session - 03/22/22 1327     Visit Number 52    Number of Visits 58    Date for OT Re-Evaluation 04/15/22    Authorization Type workers comp additional 8 visits approved following the inital 16 visits 16+8=24, 8 additional visits approved 11/11/21, additional 14 visits approved - 01/31/22    Authorization Time Period 14 additional visits approved on 01/31/22    Authorization - Visit Number 15    Authorization -  Number of Visits 38    OT Start Time 1319    OT Stop Time 1410    OT Time Calculation (min) 51 min             Past Medical History:  Diagnosis Date   Hypertension    Seizure disorder ([facility])     Past Surgical History:  Procedure Laterality Date   CHOLECYSTECTOMY     OPEN REDUCTION INTERNAL FIXATION (ORIF) DISTAL PHALANX Right 07/12/2021   Procedure: OPEN REDUCTION INTERNAL FIXATION (ORIF) DISTAL RADIUS;  Surgeon: Cindra Presume, MD;  Location: [facility];  Service: Plastics;  Laterality: Right;    There were no vitals filed for this visit.   Subjective Assessment - 03/22/22 1345     Subjective  Pt saw the MD and he is going to have shoulder surgery.    Pertinent History 56 yo male with onset of fall at work off a ladder was brought to [facility] on 9/23 and received ORIF for R distal radial fracture on 9/26.  Mildly displaced R ulnar styloid fracture not requiring sx.  Received TLSO for spinal injuries of T12 comp fx, T11 anterior wedging, B inferior articular process and spinous process fractures.  PMHx:  HA, colloid cyst of brain, idiopathic intracranial hypertension, LE edema, lumbar spondylosis and radiculopathy, chestpain, HLD, seizure disorder.    Limitations Back precuations from recent back surgery 12/03/21, no lifting over 15 lbs, continues tx for R shoulder and hand pain, rotator cuff tear per MRI,await clarification of restrictions Per Dr. Ninfa Linden- no excessive use of overhead weights  to right shoulder    Special Tests MRI of R shoulder results-Small partial-thickness midsubstance insertional tear of the mid  to posterior supraspinatus tendon footprint. No tendon retraction.  2. Tiny partial-thickness tear within the mid to anterior  infraspinatus tendon in the region of subcortical cystic change  within the underlying humeral head.  3. Mild degenerative changes of the acromioclavicular joint.  4. Mild-to-moderate thinning of the glenoid and humeral head  cartilage.    Patient Stated  Goals custom splint    Currently in Pain? Yes    Pain Score 3     Pain Location Shoulder    Pain Orientation Right    Pain Descriptors / Indicators Aching    Pain Type Acute pain    Pain Onset More than a month ago    Pain Frequency Intermittent    Aggravating Factors  overuse, exercise    Pain Relieving Factors heat, kinesiotape, [place]    Pain Score 3    Pain Location Hand    Pain Orientation Right    Pain Descriptors / Indicators Aching    Pain Type Chronic pain    Pain Onset More than a month ago    Pain Frequency Intermittent    Aggravating Factors  swelling    Pain Relieving Factors heat                         Treatment:  Hot pack to right shoulder  x 10 mins while  performing scapular retraction, and supine  AA/ROM shoulder flexion, no adverse reactions Supine  with head elevated closed chain chest press then shoulder flexion only to approx 90*,   Seated closed chain shoulder flexion, and internal external rotation with cane in pain free ROM.Marland Kitchen Kinesiotape was applied to right shoulder with Y strip to support upper arm/ shoulder and additional strip to relax traps.  Pt reports that the tape helps his shoulder pain. Pt observed application using mirror and he was provided with written instructions so that his wife can perform at home.  Pt was instructed to avoid isometric shoulder exercises, and theraband  exercises, he was instructed to only perform gentle ROM at home.  Pt was instructed to limit hand and wrist strengthening to every other day.  Therapist checked goals and discussed plans for d/c.       OT Education - 03/23/22 1610     Education Details kinesiotape application, performance of gentle A/ROM exercises at home with cane, pt was instructed to  avoid resistance and isometrics- see pt instructions    Person(s) Educated Patient    Methods Explanation;Demonstration;Verbal cues;Handout    Comprehension Verbalized understanding;Returned  demonstration;Verbal cues required                TITLE Pt will report increased ease with cutting food and opening containers using RUE.    Time 4    Period Weeks    Status Not Met   still difficulty with cutting food, sometime has difficulty with opening containers, difficulty using a can opener   Target Date 03/18/22      OT SHORT TERM GOAL  #10   TITLE Pt will be I with updated HEP to address right shoulder ROM    Time 4    Period Weeks    Status Achieved    Target Date 03/18/22      OT SHORT TERM GOAL  #11   TITLE Pt will demonstrate ability to retrieve a lightweight object  at 110 shoulder flexion with pain no greater than 3/10    Time 4    Period Weeks    Status Not met   100  prior to increased pain   Target Date 01/21/22      OT SHORT TERM GOAL  #12   TITLE Pt will demonstrate 55* wrist extension for increased ease with ADLS.    Time 4    Period Weeks    Status Not met   50 wrist extension     OT SHORT TERM GOAL  #13   TITLE Pt will verbalize understanding of activity modification, and positioning for sleep to minimize shoulder pain    Time 4    Period Weeks    Status Achieved   Pt verbalizes understanding.              OT Long Term Goals - 03/22/22 1341       OT LONG TERM GOAL #1   Title I with updated HEP.    Status Achieved   met for hand-Pt has updated HEP for green putty, cane exercises shoulder   Target Date 02/18/22      OT LONG TERM GOAL #2   Title Pt will demonstrate RUE grip strength of 45 lbs or greater for increased RUE functional use.    Time 8    Period Weeks    Status Not Met   41.5- 02/17/22, 6/6//23-32.6   Target Date 04/15/22      OT LONG TERM GOAL #3   Title Pt will demonstrate wrist flexion/ extension WFLS for work activities.    Time 8    Period Weeks    Status Partially Met   wrist flexion/ extension:60/45-50- met for flexion only     OT LONG TERM GOAL #4   Title ----      OT LONG TERM GOAL #5   Title Pt will resume  use of RUE at least 90% of the time for ADLS/ IADLS with pain no greater than 3/10.    Time 8    Period Weeks    Status Not Met   uses 75%, with pain 5/10 at worst, not consistent, 3/10 at times however pain increases to 4-5 /10 at times.     OT LONG TERM GOAL #6   Title ---------------    Status --   unable to perfrom due to pain and back prec.     OT LONG TERM GOAL #7   Title Pt will demonstrate ability to retrieve a lightweight object at 120 shoulder flexion with pain less than or equal to 3/10.    Time 8    Period Weeks    Status Not Met   grossly 90-100* with pain less than 3/10   Target Date 02/18/22      OT LONG TERM GOAL #8   Title Pt will increase RUE tip, and 3 pt pinch to 8 lbs or greater for increased functional use of RUE.    Baseline Tip 4 lbs, 3pt 4 lbs    Time 8    Period Weeks    Status Achieved   tip 9lbs , 3pt 9 lbs   Target Date 02/18/22                   Plan - 03/23/22 1552     Clinical Impression Statement Pt saw Dr. Ninfa Linden and he will be having shoulder surgery in the future. /therapist discussed discharge from therapy at this time. Pt is  able to perform gentle A/ROM, AA/ROM to shoulder at home until surgery. Pt's progress for his R hand has plateaued and he has been provided with HEP.   OT Occupational Profile and History Detailed Assessment- Review of Records and additional review of physical, cognitive, psychosocial history related to current functional performance    Occupational performance deficits (Please refer to evaluation for details): ADL's;IADL's;Work;Social Participation;Rest and Sleep;Leisure    Body Structure / Function / Physical Skills ADL;UE functional use;Endurance;Balance;Flexibility;Pain;FMC;ROM;Coordination;GMC;Decreased knowledge of precautions;Sensation;Scar mobility;Decreased knowledge of use of DME;IADL;Skin integrity;Dexterity;Strength;Mobility;Edema    Rehab Potential Good    Clinical Decision Making Limited treatment  options, no task modification necessary    Comorbidities Affecting Occupational Performance: May have comorbidities impacting occupational performance    Modification or Assistance to Complete Evaluation  Min-Moderate modification of tasks or assist with assess necessary to complete eval    OT Frequency 2x / week    OT Duration 8 weeks    OT Treatment/Interventions Self-care/ADL training;Ultrasound;Patient/family education;Scar mobilization;DME and/or AE instruction;Paraffin;Passive range of motion;Fluidtherapy;Cryotherapy;Functional Mobility Training;Splinting;Therapeutic activities;Manual Therapy;Moist Heat;Neuromuscular education    Plan discharge OT for R shoulder and hand , recommend pt receives post surgical PT at ortho care.   Consulted and Agree with Plan of Care Patient             Patient will benefit from skilled therapeutic intervention in order to improve the following deficits and impairments:   Body Structure / Function / Physical Skills: ADL, UE functional use, Endurance, Balance, Flexibility, Pain, FMC, ROM, Coordination, GMC, Decreased knowledge of precautions, Sensation, Scar mobility, Decreased knowledge of use of DME, IADL, Skin integrity, Dexterity, Strength, Mobility, Edema       Visit Diagnosis: Chronic right shoulder pain  Muscle weakness (generalized)  Other lack of coordination  Stiffness of right wrist, not elsewhere classified  Pain in right wrist  Localized edema  Stiffness of right shoulder, not elsewhere classified    Problem List Patient Active Problem List   Diagnosis Date Noted   Fall 07/09/2021   Arthritis, multiple joint involvement 09/24/2019   Class 1 obesity due to excess calories with serious comorbidity and body mass index (BMI) of 32.0 to 32.9 in adult 09/24/2019   Essential hypertension 09/24/2019   Mixed hyperlipidemia 09/24/2019   Restless legs syndrome 09/24/2019   Allergic rhinitis 11/25/2013   FHx: heart disease  11/25/2013    Rishab Stoudt, OT 03/23/2022, 4:14 PM Theone Murdoch, OTR/L [phone] Phone: [phone] 4:16 PM 03/23/22  [facility] [address] [place], [place], [address] Phone: [phone]   Fax:  [phone]  Name: Craig Church MRN: [id number] Date of Birth: 08-28-1966

## 2022-03-29 ENCOUNTER — Ambulatory Visit: Payer: Worker's Compensation | Admitting: Occupational Therapy

## 2022-04-07 ENCOUNTER — Encounter: Payer: Self-pay | Admitting: Occupational Therapy

## 2022-04-12 ENCOUNTER — Encounter: Payer: Self-pay | Admitting: Occupational Therapy

## 2022-04-14 ENCOUNTER — Encounter: Payer: Self-pay | Admitting: Occupational Therapy

## 2022-04-21 ENCOUNTER — Ambulatory Visit (INDEPENDENT_AMBULATORY_CARE_PROVIDER_SITE_OTHER): Payer: Worker's Compensation | Admitting: Plastic Surgery

## 2022-04-21 DIAGNOSIS — S52501A Unspecified fracture of the lower end of right radius, initial encounter for closed fracture: Secondary | ICD-10-CM

## 2022-04-21 NOTE — Progress Notes (Signed)
   Referring Provider April Manson, NP [phone],  [place] [address]   CC:  Chief Complaint  Patient presents with   Follow-up      Craig Church is an 56 y.o. male.  HPI: Patient presents to discuss trouble with right hand function 9 months out from open reduction internal fixation of distal radius fracture.  He has been bothered by persistent stiffness in his hand.  He reports intermittent tingling in all fingers.  He can generally fully extend but has difficulty getting all the way down into his palm.  He reports that therapy that when his hand is warmed up he has significantly improved range of motion.  He does not report pain as a primary bother but he does admit tenderness dorsally when his fingers are flexed.  He has had persistent shoulder pain and stiffness as well and it sounds like a subacromial decompression is planned.  Review of Systems General: Denies fevers and chills  Physical Exam    07/20/2021    7:00 AM 07/19/2021    9:16 PM 07/19/2021    3:00 PM  Vitals with BMI  Systolic 120 124 505  Diastolic 74 74 67  Pulse 59 66 89    General:  No acute distress,  Alert and oriented, Non-Toxic, Normal speech and affect Right hand: Fingers well-perfused normal cap refill palp radial pulse.  Sensation is intact throughout.  He reports subjective tingling in all fingers intermittently.  Median nerve compression test at the wrist is negative for change in symptoms.  Nontender volarly over the scar.  Wrist range of motion is 20 to 30 degrees flexion extension passively and actively.  He can fully extend his pinky fingers.  He has some resistance to flexion of his index and long finger down into his palm.  Can flex his thumb.  X-rays were reviewed.  Recent films from March showed good healing of the fracture in a satisfactory position and satisfactory placement of the hardware.  Assessment/Plan Patient presents with persistent discomfort and functional limitations 9  months out from distal radius fracture.  His physical therapy has been discontinued due to plateau of his progress.  His hand therapist brought up to him the possibility of complex regional pain syndrome diagnosis.  While it does not seem that pain is a primary complaint of his it is possible that investigating this would yield some benefit.  Particularly since I cannot think of a good surgical intervention to increase his function.  We will plan referral to pain management which will be organized through his Worker's Comp. agent.  I will see him back pending the results of that referral.  All of his questions were answered.  Allena Napoleon 04/21/2022, 5:58 PM

## 2022-05-05 ENCOUNTER — Telehealth: Payer: Self-pay | Admitting: *Deleted

## 2022-05-05 NOTE — Telephone Encounter (Signed)
Received call from Rayne Du Case Manager from [facility] requesting Op notes on the patient.    Called and spoke with Mr. Manson Passey and informed him that we do not have the Op notes on this patient.  He will need to reach out to [facility] Records for the Op notes.  Mr Manson Passey verbalized understanding and agreed.//AB/CMA

## 2022-06-16 ENCOUNTER — Other Ambulatory Visit: Payer: Self-pay | Admitting: Orthopaedic Surgery

## 2022-06-16 DIAGNOSIS — S46011D Strain of muscle(s) and tendon(s) of the rotator cuff of right shoulder, subsequent encounter: Secondary | ICD-10-CM | POA: Diagnosis not present

## 2022-06-16 DIAGNOSIS — M7541 Impingement syndrome of right shoulder: Secondary | ICD-10-CM | POA: Diagnosis not present

## 2022-06-16 MED ORDER — HYDROCODONE-ACETAMINOPHEN 7.5-325 MG PO TABS: 1.0000 | ORAL_TABLET | Freq: Four times a day (QID) | ORAL | 0 refills | Status: DC | PRN

## 2022-06-23 ENCOUNTER — Ambulatory Visit (INDEPENDENT_AMBULATORY_CARE_PROVIDER_SITE_OTHER): Payer: Worker's Compensation | Admitting: Orthopaedic Surgery

## 2022-06-23 ENCOUNTER — Encounter: Payer: Self-pay | Admitting: Orthopaedic Surgery

## 2022-06-23 DIAGNOSIS — Z9889 Other specified postprocedural states: Secondary | ICD-10-CM

## 2022-06-23 NOTE — Progress Notes (Signed)
HPI: [name] returns today status post right shoulder arthroscopy 06/16/2022.  He states that shoulder actually feels better.  He has noticed some increased range of motion.  He is taking no pain medication currently.  His shoulder arthroscopy showed no rotator cuff tear.  Did show significant inflamed tissue.  Significant debridement was performed and subacromial decompression with a partial acromioplasty was performed.  Review of systems: See HPI otherwise negative.  Physical exam: General well-developed well-nourished male no acute distress.Port sites are all healing well no signs of infection no drainage.  No dehiscence.  Active forward flexion 90 degrees passive and can bring to 120 degrees.  Good range of motion of the right elbow with some discomfort  Impression: Status post right shoulder arthroscopy 06/16/2022  Plan: We will send him to formal therapy on his right shoulder to work on range of motion, strengthening, home exercise program and modalities.  We will see him back in 1 month to see how he is doing.  Sutures were harvested today.  He will work on scar tissue mobilization over the port sites.

## 2022-06-27 ENCOUNTER — Other Ambulatory Visit: Payer: Self-pay

## 2022-06-27 DIAGNOSIS — Z9889 Other specified postprocedural states: Secondary | ICD-10-CM

## 2022-06-28 ENCOUNTER — Other Ambulatory Visit: Payer: Self-pay

## 2022-06-28 DIAGNOSIS — Z9889 Other specified postprocedural states: Secondary | ICD-10-CM

## 2022-06-29 ENCOUNTER — Telehealth: Payer: Self-pay | Admitting: Orthopaedic Surgery

## 2022-06-29 DIAGNOSIS — Z9889 Other specified postprocedural states: Secondary | ICD-10-CM

## 2022-06-29 NOTE — Telephone Encounter (Signed)
Received call from Crystal with [facility] asking if the order can be changed to OT instead of PT. The number to contact patient is [phone]   fax [phone]

## 2022-06-30 NOTE — Telephone Encounter (Signed)
OT order put in. Can you please make sure crystal with [facility] sees it?

## 2022-07-04 ENCOUNTER — Other Ambulatory Visit: Payer: Self-pay

## 2022-07-04 ENCOUNTER — Ambulatory Visit: Payer: Worker's Compensation | Attending: Orthopaedic Surgery | Admitting: Physical Therapy

## 2022-07-04 DIAGNOSIS — M6281 Muscle weakness (generalized): Secondary | ICD-10-CM | POA: Diagnosis present

## 2022-07-04 DIAGNOSIS — M25511 Pain in right shoulder: Secondary | ICD-10-CM | POA: Diagnosis present

## 2022-07-04 DIAGNOSIS — M25611 Stiffness of right shoulder, not elsewhere classified: Secondary | ICD-10-CM | POA: Diagnosis present

## 2022-07-04 DIAGNOSIS — G8929 Other chronic pain: Secondary | ICD-10-CM | POA: Insufficient documentation

## 2022-07-04 DIAGNOSIS — Z9889 Other specified postprocedural states: Secondary | ICD-10-CM | POA: Insufficient documentation

## 2022-07-04 NOTE — Therapy (Signed)
OUTPATIENT PHYSICAL THERAPY SHOULDER EVALUATION   Patient Name: Craig Church MRN: [id number] DOB:1965/12/03, 56 y.o., male Today's Date: 07/04/2022   PT End of Session - 07/04/22 1245     Visit Number 1    Number of Visits 12    Date for PT Re-Evaluation 08/15/22    PT Start Time 0945    PT Stop Time 1031    PT Time Calculation (min) 46 min    Activity Tolerance Patient tolerated treatment well    Behavior During Therapy [facility] for tasks assessed/performed             Past Medical History:  Diagnosis Date   Hypertension    Seizure disorder [facility])    Past Surgical History:  Procedure Laterality Date   CHOLECYSTECTOMY     OPEN REDUCTION INTERNAL FIXATION (ORIF) DISTAL PHALANX Right 07/12/2021   Procedure: OPEN REDUCTION INTERNAL FIXATION (ORIF) DISTAL RADIUS;  Surgeon: Cindra Presume, MD;  Location: [facility];  Service: Plastics;  Laterality: Right;   Patient Active Problem List   Diagnosis Date Noted   Fall 07/09/2021   Arthritis, multiple joint involvement 09/24/2019   Class 1 obesity due to excess calories with serious comorbidity and body mass index (BMI) of 32.0 to 32.9 in adult 09/24/2019   Essential hypertension 09/24/2019   Mixed hyperlipidemia 09/24/2019   Restless legs syndrome 09/24/2019   Allergic rhinitis 11/25/2013   FHx: heart disease 11/25/2013     REFERRING PROVIDER: Jean Rosenthal MD  REFERRING DIAG: S/p arthroscopy of right shoulder.  THERAPY DIAG:  Chronic right shoulder pain - Plan: PT plan of care cert/re-cert  Stiffness of right shoulder, not elsewhere classified - Plan: PT plan of care cert/re-cert  Rationale for Evaluation and Treatment Rehabilitation  ONSET DATE: 06/16/22 (surgery date).  SUBJECTIVE:                                                                                                                                                                                      SUBJECTIVE STATEMENT: The patient presents to the clinic  s/p right shoulder arthroscopy performed on 06/16/12.  His original injury was on 07/09/22 when he fell from a train car ladder while at work.  He is out of the sling and performing the pendulum exercise.  Today, he rates his pain at a 4/10 and he states surgery has been helpful.  He describes the pain as sore and throbbing.    PERTINENT HISTORY: Thoracic compression fractures and right wrist ORIF due to above stated work-related injury.  PAIN:  Are you having pain? Yes: NPRS scale: 4/10 Pain location: Right shoulder. Pain description: Ache, throb. Aggravating factors: Movement Relieving  factors: Rest.  PRECAUTIONS: Other: Per shoulder scope protocol.  PLOF: Independent  PATIENT GOALS:  Use right shoulder without pain.  OBJECTIVE:   UPPER EXTREMITY ROM:   In supine:  AAROM into right shoulder flexion is 120 degrees and ER to 40 degrees.  PALPATION:  Some tenderness around right shoulder incisional sites which appear to be healing well.   TODAY'S TREATMENT:  Vasopneumatic on low x 15 minutes with pillow between thorax and right elbow.  Patient enjoyed treatment.    ASSESSMENT:  CLINICAL IMPRESSION: The patient presents to OPPT s/p right shoulder arthroscopic surgery performed on 06/16/22.  He is out of the sling and has been performing the pendulum exercise.  He has a loss of range of motion and functional usage of his right shoulder currently.  He has some tenderness near his incisional sites which appear to be healing well.  Patient will benefit from skilled physical therapy intervention to address pain and deficits.   OBJECTIVE IMPAIRMENTS decreased activity tolerance and pain.   ACTIVITY LIMITATIONS reach over head  PERSONAL FACTORS Time since onset of injury/illness/exacerbation are also affecting patient's functional outcome.   REHAB POTENTIAL: Excellent  CLINICAL DECISION MAKING: Stable/uncomplicated  EVALUATION COMPLEXITY: Low   GOALS:  LONG TERM GOALS:  Target date: 08/15/2022  (Remove Blue Hyperlink)  Ind with HEP. Baseline:  Goal status: INITIAL  2.  Active right shoulder flexion to 155 degrees so the patient can easily reach overhead. Baseline:  Goal status: INITIAL  3.  Active ER to 75 degrees+ to allow for easily donning/doffing of apparel. Baseline:  Goal status: INITIAL  4.  Perform ADL's with pain not > 2-3/10. Baseline:  Goal status: INITIAL    PLAN: PT FREQUENCY: 2x/week  PT DURATION: 6 months  PLANNED INTERVENTIONS: Therapeutic exercises, Therapeutic activity, Neuromuscular re-education, Patient/Family education, Self Care, Electrical stimulation, Cryotherapy, Moist heat, Vasopneumatic device, Ultrasound, and Manual therapy  PLAN FOR NEXT SESSION: Please instruct patient in supine cane stretch to increase ER and countertop stretch to increase flexion.  Begin with gentle range of motion to right shoulder.  Progress to pulleys, wall ladder and UE Ranger.   Raynah Gomes, [place], PT 07/04/2022, 1:01 PM

## 2022-07-06 ENCOUNTER — Ambulatory Visit: Payer: Worker's Compensation

## 2022-07-06 DIAGNOSIS — G8929 Other chronic pain: Secondary | ICD-10-CM

## 2022-07-06 DIAGNOSIS — M25611 Stiffness of right shoulder, not elsewhere classified: Secondary | ICD-10-CM

## 2022-07-06 DIAGNOSIS — M25511 Pain in right shoulder: Secondary | ICD-10-CM | POA: Diagnosis not present

## 2022-07-06 NOTE — Therapy (Signed)
OUTPATIENT PHYSICAL THERAPY SHOULDER TREATMENT   Patient Name: Craig Church MRN: [id number] DOB:Jun 11, 1966, 56 y.o., male Today's Date: 07/06/2022   PT End of Session - 07/06/22 0904     Visit Number 2    Number of Visits 12    Date for PT Re-Evaluation 08/15/22    PT Start Time 0900    PT Stop Time 0950    PT Time Calculation (min) 50 min    Activity Tolerance Patient tolerated treatment well    Behavior During Therapy [facility] for tasks assessed/performed             Past Medical History:  Diagnosis Date   Hypertension    Seizure disorder [facility])    Past Surgical History:  Procedure Laterality Date   CHOLECYSTECTOMY     OPEN REDUCTION INTERNAL FIXATION (ORIF) DISTAL PHALANX Right 07/12/2021   Procedure: OPEN REDUCTION INTERNAL FIXATION (ORIF) DISTAL RADIUS;  Surgeon: Allena Napoleon, MD;  Location: [facility] OR;  Service: Plastics;  Laterality: Right;   Patient Active Problem List   Diagnosis Date Noted   Fall 07/09/2021   Arthritis, multiple joint involvement 09/24/2019   Class 1 obesity due to excess calories with serious comorbidity and body mass index (BMI) of 32.0 to 32.9 in adult 09/24/2019   Essential hypertension 09/24/2019   Mixed hyperlipidemia 09/24/2019   Restless legs syndrome 09/24/2019   Allergic rhinitis 11/25/2013   FHx: heart disease 11/25/2013     REFERRING PROVIDER: Doneen Poisson MD  REFERRING DIAG: S/p arthroscopy of right shoulder.  THERAPY DIAG:  Chronic right shoulder pain  Stiffness of right shoulder, not elsewhere classified  Rationale for Evaluation and Treatment Rehabilitation  ONSET DATE: 06/16/22 (surgery date).  SUBJECTIVE:                                                                                                                                                                                      SUBJECTIVE STATEMENT: Pt arrives for today's treatment session reporting right shoulder soreness and stiffness.   PERTINENT  HISTORY: Thoracic compression fractures and right wrist ORIF due to above stated work-related injury.  PAIN:  Are you having pain? Yes: NPRS scale: 4/10 Pain location: Right shoulder. Pain description: Ache, throb. Aggravating factors: Movement Relieving factors: Rest.  PRECAUTIONS: Other: Per shoulder scope protocol.  PLOF: Independent  PATIENT GOALS:  Use right shoulder without pain.  OBJECTIVE:   UPPER EXTREMITY ROM:   In supine:  AAROM into right shoulder flexion is 120 degrees and ER to 40 degrees.  PALPATION:  Some tenderness around right shoulder incisional sites which appear to be healing well.   TODAY'S TREATMENT:  EXERCISE LOG  Exercise Repetitions and Resistance Comments  AAROM Flexion 20 reps w 3 sec hold    AAROM ER 20 reps w 3 sec hold     Counter stretch 20 reps w 3 sec hold            Blank cell = exercise not performed today   Manual Therapy Passive ROM: right shoulder, PROM into flexion and ER with gentle hold at end range, distraction and oscillations performed to promote relaxation    Modalities  Date:  Unattended Estim: Shoulder, IFC 80-150 Hz, 15 mins, Pain and Tone Vaso: Shoulder, 34 degrees, 15 mins, Pain and Edema  ASSESSMENT:  CLINICAL IMPRESSION: Pt arrives for today's treatment session reporting 4/10 right shoulder soreness and stiffness.  PROM performed into flexion and ER with gentle hold at end range.  Pt requiring numerous cues to relax and distraction and oscillations performed to promote relaxation.  AAROM performed with cane into flexion and ER.  Pt also instructed in counter stretch with good result.  Pt instructed to add all newly performed exercises to HEP.  Pt denied printed copy.  Normal responses to estim and vaso noted upon removal. Pt reported 2/10 right shoulder pain/soreness at completion of today's treatment session.     OBJECTIVE IMPAIRMENTS decreased activity tolerance and pain.    ACTIVITY LIMITATIONS reach over head  PERSONAL FACTORS Time since onset of injury/illness/exacerbation are also affecting patient's functional outcome.   REHAB POTENTIAL: Excellent  CLINICAL DECISION MAKING: Stable/uncomplicated  EVALUATION COMPLEXITY: Low   GOALS:  LONG TERM GOALS: Target date: 08/17/2022  (Remove Blue Hyperlink)  Ind with HEP. Baseline:  Goal status: INITIAL  2.  Active right shoulder flexion to 155 degrees so the patient can easily reach overhead. Baseline:  Goal status: INITIAL  3.  Active ER to 75 degrees+ to allow for easily donning/doffing of apparel. Baseline:  Goal status: INITIAL  4.  Perform ADL's with pain not > 2-3/10. Baseline:  Goal status: INITIAL    PLAN: PT FREQUENCY: 2x/week  PT DURATION: 6 months  PLANNED INTERVENTIONS: Therapeutic exercises, Therapeutic activity, Neuromuscular re-education, Patient/Family education, Self Care, Electrical stimulation, Cryotherapy, Moist heat, Vasopneumatic device, Ultrasound, and Manual therapy  PLAN FOR NEXT SESSION: Please instruct patient in supine cane stretch to increase ER and countertop stretch to increase flexion.  Begin with gentle range of motion to right shoulder.  Progress to pulleys, wall ladder and UE Ranger.   Kathrynn Ducking, PTA 07/06/2022, 10:00 AM

## 2022-07-11 ENCOUNTER — Ambulatory Visit: Payer: Worker's Compensation

## 2022-07-11 DIAGNOSIS — G8929 Other chronic pain: Secondary | ICD-10-CM

## 2022-07-11 DIAGNOSIS — M25511 Pain in right shoulder: Secondary | ICD-10-CM | POA: Diagnosis not present

## 2022-07-11 DIAGNOSIS — M25611 Stiffness of right shoulder, not elsewhere classified: Secondary | ICD-10-CM

## 2022-07-11 NOTE — Therapy (Signed)
OUTPATIENT PHYSICAL THERAPY SHOULDER TREATMENT   Patient Name: Craig Church MRN: [id number] DOB:[birth year], 56 y.o., male Today's Date: 07/11/2022   PT End of Session - 07/11/22 0911     Visit Number 3    Number of Visits 12    Date for PT Re-Evaluation 08/15/22    PT Start Time 0904    PT Stop Time 0958    PT Time Calculation (min) 54 min    Activity Tolerance Patient tolerated treatment well    Behavior During Therapy [facility] for tasks assessed/performed             Past Medical History:  Diagnosis Date   Hypertension    Seizure disorder [facility])    Past Surgical History:  Procedure Laterality Date   CHOLECYSTECTOMY     OPEN REDUCTION INTERNAL FIXATION (ORIF) DISTAL PHALANX Right 07/12/2021   Procedure: OPEN REDUCTION INTERNAL FIXATION (ORIF) DISTAL RADIUS;  Surgeon: Allena Napoleon, MD;  Location: [facility] OR;  Service: Plastics;  Laterality: Right;   Patient Active Problem List   Diagnosis Date Noted   Fall 07/09/2021   Arthritis, multiple joint involvement 09/24/2019   Class 1 obesity due to excess calories with serious comorbidity and body mass index (BMI) of 32.0 to 32.9 in adult 09/24/2019   Essential hypertension 09/24/2019   Mixed hyperlipidemia 09/24/2019   Restless legs syndrome 09/24/2019   Allergic rhinitis 11/25/2013   FHx: heart disease 11/25/2013     REFERRING PROVIDER: Doneen Poisson MD  REFERRING DIAG: S/p arthroscopy of right shoulder.  THERAPY DIAG:  Chronic right shoulder pain  Stiffness of right shoulder, not elsewhere classified  Rationale for Evaluation and Treatment Rehabilitation  ONSET DATE: 06/16/22 (surgery date).  SUBJECTIVE:                                                                                                                                                                                      SUBJECTIVE STATEMENT: Pt arrives for today's treatment session reporting increased right shoulder pain.  Pt reports performing "honey  do list" this weekend that caused increased pain.  PERTINENT HISTORY: Thoracic compression fractures and right wrist ORIF due to above stated work-related injury.  PAIN:  Are you having pain? Yes: NPRS scale: 6/10 Pain location: Right shoulder. Pain description: Ache, throb. Aggravating factors: Movement Relieving factors: Rest.  PRECAUTIONS: Other: Per shoulder scope protocol.  PLOF: Independent  PATIENT GOALS:  Use right shoulder without pain.  OBJECTIVE:   UPPER EXTREMITY ROM:   In supine:  AAROM into right shoulder flexion is 120 degrees and ER to 40 degrees.  PALPATION:  Some tenderness around right shoulder incisional sites which appear to be  healing well.   TODAY'S TREATMENT:                                      EXERCISE LOG  Exercise Repetitions and Resistance Comments  Pulley 5 mins flexion   Wall Ladder 5 reps with hold at top (max # 28)   AAROM Flexion 20 reps w 3 sec hold    AAROM ER 20 reps w 3 sec hold     Counter stretch 20 reps w 3 sec hold            Blank cell = exercise not performed today   Manual Therapy Soft Tissue Mobilization: right shoulder, STW/M to right posterior shoulder musculature as well as right deltoid to decrease pain and tone Passive ROM: right shoulder, PROM into all directions with gentle hold at end range, distraction and oscillations performed to promote relaxation    Modalities  Date:  Unattended Estim: Shoulder, IFC 80-150 Hz, 15 mins, Pain and Tone Vaso: Shoulder, 34 degrees, 15 mins, Pain and Edema  ASSESSMENT:  CLINICAL IMPRESSION: Pt arrives for today's treatment session reporting 6/10 right shoulder pain.  Pt reports performing chores at home on Saturday and his pain has been up since then.  PROM and AAROM performed today to increase ROM and function.  Pt requiring numerous cues to relax throughout ROM.  Normal responses to estim and vaso noted upon removal.  Pt reported decreased pain at completion of today's  treatment session.     OBJECTIVE IMPAIRMENTS decreased activity tolerance and pain.   ACTIVITY LIMITATIONS reach over head  PERSONAL FACTORS Time since onset of injury/illness/exacerbation are also affecting patient's functional outcome.   REHAB POTENTIAL: Excellent  CLINICAL DECISION MAKING: Stable/uncomplicated  EVALUATION COMPLEXITY: Low   GOALS:  LONG TERM GOALS: Target date: 08/22/2022  (Remove Blue Hyperlink)  Ind with HEP. Baseline:  Goal status: INITIAL  2.  Active right shoulder flexion to 155 degrees so the patient can easily reach overhead. Baseline:  Goal status: INITIAL  3.  Active ER to 75 degrees+ to allow for easily donning/doffing of apparel. Baseline:  Goal status: INITIAL  4.  Perform ADL's with pain not > 2-3/10. Baseline:  Goal status: INITIAL    PLAN: PT FREQUENCY: 2x/week  PT DURATION: 6 months  PLANNED INTERVENTIONS: Therapeutic exercises, Therapeutic activity, Neuromuscular re-education, Patient/Family education, Self Care, Electrical stimulation, Cryotherapy, Moist heat, Vasopneumatic device, Ultrasound, and Manual therapy  PLAN FOR NEXT SESSION: Please instruct patient in supine cane stretch to increase ER and countertop stretch to increase flexion.  Begin with gentle range of motion to right shoulder.  Progress to pulleys, wall ladder and UE Ranger.   Kathrynn Ducking, PTA 07/11/2022, 10:02 AM

## 2022-07-14 ENCOUNTER — Ambulatory Visit: Payer: Worker's Compensation

## 2022-07-14 DIAGNOSIS — G8929 Other chronic pain: Secondary | ICD-10-CM

## 2022-07-14 DIAGNOSIS — M6281 Muscle weakness (generalized): Secondary | ICD-10-CM

## 2022-07-14 DIAGNOSIS — M25511 Pain in right shoulder: Secondary | ICD-10-CM | POA: Diagnosis not present

## 2022-07-14 DIAGNOSIS — M25611 Stiffness of right shoulder, not elsewhere classified: Secondary | ICD-10-CM

## 2022-07-14 NOTE — Therapy (Signed)
OUTPATIENT PHYSICAL THERAPY SHOULDER TREATMENT   Patient Name: Craig Church MRN: [id number] DOB:Jul 08, 1966, 56 y.o., male Today's Date: 07/14/2022   PT End of Session - 07/14/22 0903     Visit Number 4    Number of Visits 12    Date for PT Re-Evaluation 08/15/22    PT Start Time 0900    PT Stop Time 0956    PT Time Calculation (min) 56 min    Activity Tolerance Patient tolerated treatment well    Behavior During Therapy [facility] for tasks assessed/performed             Past Medical History:  Diagnosis Date   Hypertension    Seizure disorder [facility])    Past Surgical History:  Procedure Laterality Date   CHOLECYSTECTOMY     OPEN REDUCTION INTERNAL FIXATION (ORIF) DISTAL PHALANX Right 07/12/2021   Procedure: OPEN REDUCTION INTERNAL FIXATION (ORIF) DISTAL RADIUS;  Surgeon: Cindra Presume, MD;  Location: [place];  Service: Plastics;  Laterality: Right;   Patient Active Problem List   Diagnosis Date Noted   Fall 07/09/2021   Arthritis, multiple joint involvement 09/24/2019   Class 1 obesity due to excess calories with serious comorbidity and body mass index (BMI) of 32.0 to 32.9 in adult 09/24/2019   Essential hypertension 09/24/2019   Mixed hyperlipidemia 09/24/2019   Restless legs syndrome 09/24/2019   Allergic rhinitis 11/25/2013   FHx: heart disease 11/25/2013     REFERRING PROVIDER: Jean Rosenthal MD  REFERRING DIAG: S/p arthroscopy of right shoulder.  THERAPY DIAG:  Chronic right shoulder pain  Stiffness of right shoulder, not elsewhere classified  Muscle weakness (generalized)  Rationale for Evaluation and Treatment Rehabilitation  ONSET DATE: 06/16/22 (surgery date).  SUBJECTIVE:                                                                                                                                                                                      SUBJECTIVE STATEMENT: Pt arrives for today's treatment session reporting that shoulder is feeling  much better.   PERTINENT HISTORY: Thoracic compression fractures and right wrist ORIF due to above stated work-related injury.  PAIN:  Are you having pain? No  PRECAUTIONS: Other: Per shoulder scope protocol.  PLOF: Independent  PATIENT GOALS:  Use right shoulder without pain.  OBJECTIVE:   UPPER EXTREMITY ROM:   In supine:  AAROM into right shoulder flexion is 120 degrees and ER to 40 degrees.  PALPATION:  Some tenderness around right shoulder incisional sites which appear to be healing well.   TODAY'S TREATMENT:  EXERCISE LOG  Exercise Repetitions and Resistance Comments  Pulley 5 mins flexion   Wall Ladder 5 reps with hold at top (max # 28)   AAROM Flexion 25 reps w 3 sec hold    AAROM Chest Press 20 reps   AAROM ER 25 reps w 3 sec hold     Counter stretch 25 reps w 3 sec hold   Wall Washing X2 mins   Doorway Stretch 5 reps x 30 sec    Blank cell = exercise not performed today   Manual Therapy Soft Tissue Mobilization: right shoulder, STW/M to right posterior shoulder musculature as well as right deltoid to decrease pain and tone Passive ROM: right shoulder, PROM into all directions with gentle hold at end range, distraction and oscillations performed to promote relaxation    Modalities  Date:  Unattended Estim: Shoulder, IFC 80-150 Hz, 15 mins, Pain and Tone Vaso: Shoulder, 34 degrees, 15 mins, Pain and Edema  ASSESSMENT:  CLINICAL IMPRESSION: Pt arrives for today's treatment session denying any pain. Pt reports that his shoulder is feeling much better today.  Pt introduced to wall washing and doorway stretch.  Pt reported increased soreness towards end of reps.  Normal responses to estim and vaso noted upon removal.  Pt denied any pain at completion of today's treatment session.     OBJECTIVE IMPAIRMENTS decreased activity tolerance and pain.   ACTIVITY LIMITATIONS reach over head  PERSONAL FACTORS Time since onset  of injury/illness/exacerbation are also affecting patient's functional outcome.   REHAB POTENTIAL: Excellent  CLINICAL DECISION MAKING: Stable/uncomplicated  EVALUATION COMPLEXITY: Low   GOALS:  LONG TERM GOALS: Target date: 08/25/2022  (Remove Blue Hyperlink)  Ind with HEP. Baseline:  Goal status: INITIAL  2.  Active right shoulder flexion to 155 degrees so the patient can easily reach overhead. Baseline:  Goal status: INITIAL  3.  Active ER to 75 degrees+ to allow for easily donning/doffing of apparel. Baseline:  Goal status: INITIAL  4.  Perform ADL's with pain not > 2-3/10. Baseline:  Goal status: INITIAL    PLAN: PT FREQUENCY: 2x/week  PT DURATION: 6 months  PLANNED INTERVENTIONS: Therapeutic exercises, Therapeutic activity, Neuromuscular re-education, Patient/Family education, Self Care, Electrical stimulation, Cryotherapy, Moist heat, Vasopneumatic device, Ultrasound, and Manual therapy  PLAN FOR NEXT SESSION: Please instruct patient in supine cane stretch to increase ER and countertop stretch to increase flexion.  Begin with gentle range of motion to right shoulder.  Progress to pulleys, wall ladder and UE Ranger.   Newman Pies, PTA 07/14/2022, 10:02 AM

## 2022-07-18 ENCOUNTER — Ambulatory Visit: Payer: Worker's Compensation | Attending: Orthopaedic Surgery

## 2022-07-18 DIAGNOSIS — M6281 Muscle weakness (generalized): Secondary | ICD-10-CM | POA: Diagnosis present

## 2022-07-18 DIAGNOSIS — G8929 Other chronic pain: Secondary | ICD-10-CM | POA: Diagnosis present

## 2022-07-18 DIAGNOSIS — M25511 Pain in right shoulder: Secondary | ICD-10-CM | POA: Insufficient documentation

## 2022-07-18 DIAGNOSIS — M25611 Stiffness of right shoulder, not elsewhere classified: Secondary | ICD-10-CM | POA: Diagnosis present

## 2022-07-18 NOTE — Therapy (Signed)
OUTPATIENT PHYSICAL THERAPY SHOULDER TREATMENT   Patient Name: Craig Church MRN: [id number] DOB:31-Jan-1966, 56 y.o., male Today's Date: 07/18/2022   PT End of Session - 07/18/22 0952     Visit Number 5    Number of Visits 12    Date for PT Re-Evaluation 08/15/22    PT Start Time 0945    Activity Tolerance Patient tolerated treatment well    Behavior During Therapy [facility] for tasks assessed/performed             Past Medical History:  Diagnosis Date   Hypertension    Seizure disorder [facility])    Past Surgical History:  Procedure Laterality Date   CHOLECYSTECTOMY     OPEN REDUCTION INTERNAL FIXATION (ORIF) DISTAL PHALANX Right 07/12/2021   Procedure: OPEN REDUCTION INTERNAL FIXATION (ORIF) DISTAL RADIUS;  Surgeon: Cindra Presume, MD;  Location: [place];  Service: Plastics;  Laterality: Right;   Patient Active Problem List   Diagnosis Date Noted   Fall 07/09/2021   Arthritis, multiple joint involvement 09/24/2019   Class 1 obesity due to excess calories with serious comorbidity and body mass index (BMI) of 32.0 to 32.9 in adult 09/24/2019   Essential hypertension 09/24/2019   Mixed hyperlipidemia 09/24/2019   Restless legs syndrome 09/24/2019   Allergic rhinitis 11/25/2013   FHx: heart disease 11/25/2013     REFERRING PROVIDER: Jean Rosenthal MD  REFERRING DIAG: S/p arthroscopy of right shoulder.  THERAPY DIAG:  Chronic right shoulder pain  Stiffness of right shoulder, not elsewhere classified  Muscle weakness (generalized)  Rationale for Evaluation and Treatment Rehabilitation  ONSET DATE: 06/16/22 (surgery date).  SUBJECTIVE:                                                                                                                                                                                      SUBJECTIVE STATEMENT: Pt arrives for today's treatment session reporting that shoulder is feeling much better.  Shoulder is just feeling sore.   PERTINENT  HISTORY: Thoracic compression fractures and right wrist ORIF due to above stated work-related injury.  PAIN:  Are you having pain? Yes: NPRS scale: 2/10 Pain location: right shoulder  PRECAUTIONS: Other: Per shoulder scope protocol.  PLOF: Independent  PATIENT GOALS:  Use right shoulder without pain.  OBJECTIVE:   UPPER EXTREMITY ROM:   In supine:  AAROM into right shoulder flexion is 120 degrees and ER to 40 degrees.  PALPATION:  Some tenderness around right shoulder incisional sites which appear to be healing well.   TODAY'S TREATMENT:  EXERCISE LOG  Exercise Repetitions and Resistance Comments  Pulley 5 mins flexion   Wall Ladder 5 reps with hold at top (max # 31)   AAROM Flexion 25 reps w 3 sec hold    AAROM Chest Press 2# x 25 reps   AAROM ER 25 reps w 3 sec hold     Counter stretch 25 reps w 3 sec hold   Wall Washing    Doorway Stretch     Blank cell = exercise not performed today   Manual Therapy Soft Tissue Mobilization: right shoulder, STW/M to right posterior shoulder musculature as well as right deltoid to decrease pain and tone Passive ROM: right shoulder, PROM into all directions with gentle hold at end range, distraction and oscillations performed to promote relaxation    Modalities  Date:  Unattended Estim: Shoulder, IFC 80-150 Hz, 15 mins, Pain and Tone Vaso: Shoulder, 34 degrees, 15 mins, Pain and Edema  ASSESSMENT:  CLINICAL IMPRESSION: Pt arrives for today's treatment session reporting 2/10 right shoulder soreness.  Pt able to demonstrate increased flexion on wall ladder with ability to reach max # of 31.  Pt able to demonstrate 146 degrees of active flexion and 46 degrees of active ER.  Pt is making good progress towards all of his goals at this time.  Pt reports that his shoulder is feeling much better and is most troubled by his back at this time.  Normal responses to estim and vaso noted upon removal.  Pt  denied any pain upon completion of today's treatment session.    OBJECTIVE IMPAIRMENTS decreased activity tolerance and pain.   ACTIVITY LIMITATIONS reach over head  PERSONAL FACTORS Time since onset of injury/illness/exacerbation are also affecting patient's functional outcome.   REHAB POTENTIAL: Excellent  CLINICAL DECISION MAKING: Stable/uncomplicated  EVALUATION COMPLEXITY: Low   GOALS:  LONG TERM GOALS: Target date: 08/29/2022  (Remove Blue Hyperlink)  Ind with HEP. Baseline:  Goal status: MET  2.  Active right shoulder flexion to 155 degrees so the patient can easily reach overhead. Baseline: 10/2: 146 degrees  Goal status: IN PROGRESS  3.  Active ER to 75 degrees+ to allow for easily donning/doffing of apparel. Baseline: 10/2: 46 degrees Goal status: IN PROGRESS  4.  Perform ADL's with pain not > 2-3/10. Baseline: 10/2: depends on the day and activities Goal status: IN PROGRESS    PLAN: PT FREQUENCY: 2x/week  PT DURATION: 6 months  PLANNED INTERVENTIONS: Therapeutic exercises, Therapeutic activity, Neuromuscular re-education, Patient/Family education, Self Care, Electrical stimulation, Cryotherapy, Moist heat, Vasopneumatic device, Ultrasound, and Manual therapy  PLAN FOR NEXT SESSION: Please instruct patient in supine cane stretch to increase ER and countertop stretch to increase flexion.  Begin with gentle range of motion to right shoulder.  Progress to pulleys, wall ladder and UE Ranger.   Kathrynn Ducking, PTA 07/18/2022, 10:49 AM

## 2022-07-20 ENCOUNTER — Ambulatory Visit (INDEPENDENT_AMBULATORY_CARE_PROVIDER_SITE_OTHER): Payer: Worker's Compensation | Admitting: Orthopaedic Surgery

## 2022-07-20 ENCOUNTER — Encounter: Payer: Self-pay | Admitting: Orthopaedic Surgery

## 2022-07-20 DIAGNOSIS — Z9889 Other specified postprocedural states: Secondary | ICD-10-CM | POA: Diagnosis not present

## 2022-07-20 MED ORDER — LIDOCAINE HCL 1 % IJ SOLN
3.0000 mL | INTRAMUSCULAR | Status: AC | PRN
Start: 2022-07-20 — End: 2022-07-20
  Administered 2022-07-20: 3 mL

## 2022-07-20 MED ORDER — METHYLPREDNISOLONE ACETATE 40 MG/ML IJ SUSP
40.0000 mg | INTRAMUSCULAR | Status: AC | PRN
  Administered 2022-07-20: 40 mg via INTRA_ARTICULAR

## 2022-07-20 NOTE — Progress Notes (Signed)
Office Visit Note   Patient: Craig Church           Date of Birth: 12-29-1965           MRN: [id number] Visit Date: 07/20/2022              Requested by: Jettie Booze, NP [place],  [place] [address] PCP: Jettie Booze, NP   Assessment & Plan: Visit Diagnoses:  1. Status post arthroscopy of right shoulder     Plan: I spoke with him today about trying a steroid injection to help calm down the inflammatory pain postoperative in his right shoulder.  He agreed to this and tolerated it very well in the subacromial outlet.  His case manager is with him today.  We talked about work restrictions only for his right shoulder.  He is out of work as it relates to his spine.  However with his right shoulder we will have him avoid overhead reaching and overhead activities.  He will avoid repetitive overhead activities as well.  He should lift no greater than 15 to 20 pounds with the right shoulder and only on occasion.  I gave a order on a prescription for continuing his outpatient physical therapy for the right shoulder.  I would like to see him back in 4 weeks to see how he is doing overall from the shoulder standpoint of things.  All questions and concerns were answered and addressed.  Follow-Up Instructions: Return in about 4 weeks (around 08/17/2022).   Orders:  Orders Placed This Encounter  Procedures   Large Joint Inj   No orders of the defined types were placed in this encounter.     Procedures: Large Joint Inj: R subacromial bursa on 07/20/2022 3:41 PM Indications: pain and diagnostic evaluation Details: 22 G 1.5 in needle  Arthrogram: No  Medications: 3 mL lidocaine 1 %; 40 mg methylPREDNISolone acetate 40 MG/ML Outcome: tolerated well, no immediate complications Procedure, treatment alternatives, risks and benefits explained, specific risks discussed. Consent was given by the patient. Immediately prior to procedure a time out was called to verify the correct  patient, procedure, equipment, support staff and site/side marked as required. Patient was prepped and draped in the usual sterile fashion.       Clinical Data: No additional findings.   Subjective: Chief Complaint  Patient presents with   Right Shoulder - Routine Post Op  The patient is now 6 weeks status post a right shoulder arthroscopy with debridement and subacromial decompression.  We did not find any rotator cuff tear and there was inflamed tissue.  This was secondary to an 8 foot fall.  He had other distracting injuries related to his spine and a wrist fracture that required surgery.  He is seen by other providers for his spine and his wrist.  His right shoulder started giving him significant problems with overhead activities and reaching behind him and was having continued pain.  A MRI of the shoulder suggested a partial rotator cuff tear.  Intraoperatively we found inflamed tissue in the shoulder and tendinosis of the rotator cuff as well as some partial labral tearing which was minimal.  We found inflamed biceps tendon as well.  He is now in physical therapy for his shoulder.  He says that is doing better overall he is working with physical therapy to help increase the motion of his right shoulder.  He is still having some soreness and pain.  HPI  Review of Systems There is no listed fever, chills, nausea, vomiting  Objective: Vital Signs: There were no vitals taken for this visit.  Physical Exam He is alert and oriented and in no acute distress Ortho Exam Examination of his right shoulder shows no significant weakness in the rotator cuff but he still lacks full abduction and full forward flexion by about 10 degrees.  His internal rotation with adduction is still to the lower lumbar spine on the right side. Specialty Comments:  No specialty comments available.  Imaging: No results found.   PMFS History: Patient Active Problem List   Diagnosis Date Noted   Fall  07/09/2021   Arthritis, multiple joint involvement 09/24/2019   Class 1 obesity due to excess calories with serious comorbidity and body mass index (BMI) of 32.0 to 32.9 in adult 09/24/2019   Essential hypertension 09/24/2019   Mixed hyperlipidemia 09/24/2019   Restless legs syndrome 09/24/2019   Allergic rhinitis 11/25/2013   FHx: heart disease 11/25/2013   Past Medical History:  Diagnosis Date   Hypertension    Seizure disorder [facility])     History reviewed. No pertinent family history.  Past Surgical History:  Procedure Laterality Date   CHOLECYSTECTOMY     OPEN REDUCTION INTERNAL FIXATION (ORIF) DISTAL PHALANX Right 07/12/2021   Procedure: OPEN REDUCTION INTERNAL FIXATION (ORIF) DISTAL RADIUS;  Surgeon: Cindra Presume, MD;  Location: [place];  Service: Plastics;  Laterality: Right;   Social History   Occupational History   Not on file  Tobacco Use   Smoking status: Never   Smokeless tobacco: Current    Types: Chew  Substance and Sexual Activity   Alcohol use: Never   Drug use: Never   Sexual activity: Not on file

## 2022-07-21 ENCOUNTER — Ambulatory Visit: Payer: Worker's Compensation | Admitting: Physical Therapy

## 2022-07-21 DIAGNOSIS — M25611 Stiffness of right shoulder, not elsewhere classified: Secondary | ICD-10-CM

## 2022-07-21 DIAGNOSIS — G8929 Other chronic pain: Secondary | ICD-10-CM

## 2022-07-21 DIAGNOSIS — M25511 Pain in right shoulder: Secondary | ICD-10-CM | POA: Diagnosis not present

## 2022-07-21 NOTE — Therapy (Signed)
OUTPATIENT PHYSICAL THERAPY SHOULDER TREATMENT   Patient Name: Craig Church MRN: [id number] DOB:06/30/1966, 56 y.o., male Today's Date: 07/21/2022   PT End of Session - 07/21/22 1658     Visit Number 6    Number of Visits 12    Date for PT Re-Evaluation 08/15/22    PT Start Time 0445    PT Stop Time 0536    PT Time Calculation (min) 51 min    Activity Tolerance Patient tolerated treatment well    Behavior During Therapy [facility] for tasks assessed/performed             Past Medical History:  Diagnosis Date   Hypertension    Seizure disorder [facility])    Past Surgical History:  Procedure Laterality Date   CHOLECYSTECTOMY     OPEN REDUCTION INTERNAL FIXATION (ORIF) DISTAL PHALANX Right 07/12/2021   Procedure: OPEN REDUCTION INTERNAL FIXATION (ORIF) DISTAL RADIUS;  Surgeon: Cindra Presume, MD;  Location: [place];  Service: Plastics;  Laterality: Right;   Patient Active Problem List   Diagnosis Date Noted   Fall 07/09/2021   Arthritis, multiple joint involvement 09/24/2019   Class 1 obesity due to excess calories with serious comorbidity and body mass index (BMI) of 32.0 to 32.9 in adult 09/24/2019   Essential hypertension 09/24/2019   Mixed hyperlipidemia 09/24/2019   Restless legs syndrome 09/24/2019   Allergic rhinitis 11/25/2013   FHx: heart disease 11/25/2013     REFERRING PROVIDER: Jean Rosenthal MD  REFERRING DIAG: S/p arthroscopy of right shoulder.  THERAPY DIAG:  Chronic right shoulder pain  Stiffness of right shoulder, not elsewhere classified  Rationale for Evaluation and Treatment Rehabilitation  ONSET DATE: 06/16/22 (surgery date).  SUBJECTIVE:                                                                                                                                                                                      SUBJECTIVE STATEMENT: Dr. Yvone Neu.  Gave me a cortisone shot. PERTINENT HISTORY: Thoracic compression fractures and right wrist  ORIF due to above stated work-related injury.  PAIN:  Are you having pain? Yes: NPRS scale: 2/10 Pain location: right shoulder  PRECAUTIONS: Other: Per shoulder scope protocol.   PATIENT GOALS:  Use right shoulder without pain.  OBJECTIVE:   TODAY'S TREATMENT:                                      EXERCISE LOG  Exercise Repetitions and Resistance Comments  Pulley 5 mins    Wall Ladder 5 minutes   UE Ranger  5  minutes.                        Blank cell = exercise not performed today   Manual Therapy In supine:  PROM to patient's to right shoulder into flexion and ER with low load long duration stretching technique utilized x 10 minutes. Modalities  Date:  Unattended Estim: Shoulder, IFC 80-150 Hz, 20 mins, Pain and Tone Vaso: Shoulder, 34 degrees, 20 mins, Pain and Edema  ASSESSMENT:  CLINICAL IMPRESSION: Patient states doctor pleased was very pleased with his progress.  Received a cortisone injection.  Added UE range on wall which the patient performed with excellent technique and without complaint.  OBJECTIVE IMPAIRMENTS decreased activity tolerance and pain.   ACTIVITY LIMITATIONS reach over head  PERSONAL FACTORS Time since onset of injury/illness/exacerbation are also affecting patient's functional outcome.   REHAB POTENTIAL: Excellent  CLINICAL DECISION MAKING: Stable/uncomplicated  EVALUATION COMPLEXITY: Low   GOALS:  LONG TERM GOALS: Target date: 08/29/2022  (Remove Blue Hyperlink)  Ind with HEP. Baseline:  Goal status: MET  2.  Active right shoulder flexion to 155 degrees so the patient can easily reach overhead. Baseline: 10/2: 146 degrees  Goal status: IN PROGRESS  3.  Active ER to 75 degrees+ to allow for easily donning/doffing of apparel. Baseline: 10/2: 46 degrees Goal status: IN PROGRESS  4.  Perform ADL's with pain not > 2-3/10. Baseline: 10/2: depends on the day and activities Goal status: IN PROGRESS    PLAN: PT FREQUENCY:  2x/week  PT DURATION: 6 months  PLANNED INTERVENTIONS: Therapeutic exercises, Therapeutic activity, Neuromuscular re-education, Patient/Family education, Self Care, Electrical stimulation, Cryotherapy, Moist heat, Vasopneumatic device, Ultrasound, and Manual therapy  PLAN FOR NEXT SESSION: Please instruct patient in supine cane stretch to increase ER and countertop stretch to increase flexion.  Begin with gentle range of motion to right shoulder.  Progress to pulleys, wall ladder and UE Ranger.   Emma Birchler, [place], PT 07/21/2022, 5:40 PM

## 2022-07-25 ENCOUNTER — Ambulatory Visit: Payer: Worker's Compensation | Admitting: Physical Therapy

## 2022-07-25 ENCOUNTER — Encounter: Payer: Self-pay | Admitting: Physical Therapy

## 2022-07-25 DIAGNOSIS — G8929 Other chronic pain: Secondary | ICD-10-CM

## 2022-07-25 DIAGNOSIS — M6281 Muscle weakness (generalized): Secondary | ICD-10-CM

## 2022-07-25 DIAGNOSIS — M25511 Pain in right shoulder: Secondary | ICD-10-CM | POA: Diagnosis not present

## 2022-07-25 DIAGNOSIS — M25611 Stiffness of right shoulder, not elsewhere classified: Secondary | ICD-10-CM

## 2022-07-25 NOTE — Therapy (Signed)
OUTPATIENT PHYSICAL THERAPY SHOULDER TREATMENT   Patient Name: Craig Church MRN: [id number] DOB:Jan 10, 1966, 56 y.o., male Today's Date: 07/25/2022   PT End of Session - 07/25/22 0817     Visit Number 7    Number of Visits 12    Date for PT Re-Evaluation 08/15/22    PT Start Time 0815    PT Stop Time 0858    PT Time Calculation (min) 43 min    Activity Tolerance Patient tolerated treatment well    Behavior During Therapy [facility] for tasks assessed/performed             Past Medical History:  Diagnosis Date   Hypertension    Seizure disorder [facility])    Past Surgical History:  Procedure Laterality Date   CHOLECYSTECTOMY     OPEN REDUCTION INTERNAL FIXATION (ORIF) DISTAL PHALANX Right 07/12/2021   Procedure: OPEN REDUCTION INTERNAL FIXATION (ORIF) DISTAL RADIUS;  Surgeon: Cindra Presume, MD;  Location: [place];  Service: Plastics;  Laterality: Right;   Patient Active Problem List   Diagnosis Date Noted   Fall 07/09/2021   Arthritis, multiple joint involvement 09/24/2019   Class 1 obesity due to excess calories with serious comorbidity and body mass index (BMI) of 32.0 to 32.9 in adult 09/24/2019   Essential hypertension 09/24/2019   Mixed hyperlipidemia 09/24/2019   Restless legs syndrome 09/24/2019   Allergic rhinitis 11/25/2013   FHx: heart disease 11/25/2013     REFERRING PROVIDER: Jean Rosenthal MD  REFERRING DIAG: S/p arthroscopy of right shoulder.  THERAPY DIAG:  Chronic right shoulder pain  Stiffness of right shoulder, not elsewhere classified  Muscle weakness (generalized)  Rationale for Evaluation and Treatment Rehabilitation  ONSET DATE: 06/16/22 (surgery date).  SUBJECTIVE:                                                                                                                                                                                      SUBJECTIVE STATEMENT: Reports only stiffness around home.  PERTINENT HISTORY: Thoracic  compression fractures and right wrist ORIF due to above stated work-related injury.  PAIN:  Are you having pain? Yes: NPRS scale: 1.5/10 Pain location: right anterior shoulder  PRECAUTIONS: Other: Per shoulder scope protocol.   PATIENT GOALS:  Use right shoulder without pain.  OBJECTIVE:  TODAY'S TREATMENT:                                      EXERCISE LOG  Exercise Repetitions and Resistance Comments  Pulley 5 mins    Wall Ladder 5 minutes   UE Ranger  5 minutes.   Supine chest press X20 reps Anterior shoulder discomfort reported   Supine AAROM fleexion X20 reps   Supine AAROM ER X20 reps            Blank cell = exercise not performed today   Manual Therapy In supine:  PROM to patient's to right shoulder into flexion and ER with low load long duration stretching technique utilized  Modalities  Date:  Unattended Estim: Shoulder, IFC 80-150 Hz, 10 mins, Pain and Tone Vaso: Shoulder, 34 degrees, 10 mins, Pain and Edema  ASSESSMENT:  CLINICAL IMPRESSION: Patient presented in clinic with reports of mild pain of the R shoulder but more stiffness than anything. Patient able to complete all therex with no verbal reports of pain. Firm end feels and smooth arc of motion noted during PROM of R shoulder. Normal modalities response noted following removal of the modalities.  OBJECTIVE IMPAIRMENTS decreased activity tolerance and pain.   ACTIVITY LIMITATIONS reach over head  PERSONAL FACTORS Time since onset of injury/illness/exacerbation are also affecting patient's functional outcome.   REHAB POTENTIAL: Excellent  CLINICAL DECISION MAKING: Stable/uncomplicated  EVALUATION COMPLEXITY: Low   GOALS:  LONG TERM GOALS: Target date: 08/29/2022  (Remove Blue Hyperlink)  Ind with HEP. Baseline:  Goal status: MET  2.  Active right shoulder flexion to 155 degrees so the patient can easily reach overhead. Baseline: 10/2: 146 degrees  Goal status: IN PROGRESS  3.  Active  ER to 75 degrees+ to allow for easily donning/doffing of apparel. Baseline: 10/2: 46 degrees Goal status: IN PROGRESS  4.  Perform ADL's with pain not > 2-3/10. Baseline: 10/2: depends on the day and activities Goal status: IN PROGRESS    PLAN: PT FREQUENCY: 2x/week  PT DURATION: 6 months  PLANNED INTERVENTIONS: Therapeutic exercises, Therapeutic activity, Neuromuscular re-education, Patient/Family education, Self Care, Electrical stimulation, Cryotherapy, Moist heat, Vasopneumatic device, Ultrasound, and Manual therapy  PLAN FOR NEXT SESSION: Please instruct patient in supine cane stretch to increase ER and countertop stretch to increase flexion.  Begin with gentle range of motion to right shoulder.  Progress to pulleys, wall ladder and UE Ranger.   Standley Brooking, PTA 07/25/2022, 9:39 AM

## 2022-07-29 ENCOUNTER — Ambulatory Visit: Payer: Worker's Compensation | Admitting: *Deleted

## 2022-07-29 ENCOUNTER — Encounter: Payer: Self-pay | Admitting: *Deleted

## 2022-07-29 DIAGNOSIS — M6281 Muscle weakness (generalized): Secondary | ICD-10-CM

## 2022-07-29 DIAGNOSIS — M25511 Pain in right shoulder: Secondary | ICD-10-CM | POA: Diagnosis not present

## 2022-07-29 DIAGNOSIS — M25611 Stiffness of right shoulder, not elsewhere classified: Secondary | ICD-10-CM

## 2022-07-29 DIAGNOSIS — G8929 Other chronic pain: Secondary | ICD-10-CM

## 2022-07-29 NOTE — Therapy (Signed)
OUTPATIENT PHYSICAL THERAPY SHOULDER TREATMENT   Patient Name: Craig Church MRN: [id number] DOB:1966-05-15, 56 y.o., male Today's Date: 07/29/2022   PT End of Session - 07/29/22 0950     Visit Number 8    Number of Visits 12    Date for PT Re-Evaluation 08/15/22    PT Start Time 0946    PT Stop Time 1044    PT Time Calculation (min) 58 min             Past Medical History:  Diagnosis Date   Hypertension    Seizure disorder [facility])    Past Surgical History:  Procedure Laterality Date   CHOLECYSTECTOMY     OPEN REDUCTION INTERNAL FIXATION (ORIF) DISTAL PHALANX Right 07/12/2021   Procedure: OPEN REDUCTION INTERNAL FIXATION (ORIF) DISTAL RADIUS;  Surgeon: Cindra Presume, MD;  Location: [place];  Service: Plastics;  Laterality: Right;   Patient Active Problem List   Diagnosis Date Noted   Fall 07/09/2021   Arthritis, multiple joint involvement 09/24/2019   Class 1 obesity due to excess calories with serious comorbidity and body mass index (BMI) of 32.0 to 32.9 in adult 09/24/2019   Essential hypertension 09/24/2019   Mixed hyperlipidemia 09/24/2019   Restless legs syndrome 09/24/2019   Allergic rhinitis 11/25/2013   FHx: heart disease 11/25/2013     REFERRING PROVIDER: Jean Rosenthal MD  REFERRING DIAG: S/p arthroscopy of right shoulder.  THERAPY DIAG:  Chronic right shoulder pain  Stiffness of right shoulder, not elsewhere classified  Muscle weakness (generalized)  Rationale for Evaluation and Treatment Rehabilitation  ONSET DATE: 06/16/22 (surgery date).  SUBJECTIVE:                                                                                                                                                                                      SUBJECTIVE STATEMENT: Reports RT shldr doing very well  PERTINENT HISTORY: Thoracic compression fractures and right wrist ORIF due to above stated work-related injury.  PAIN:  Are you having pain? Yes: NPRS  scale: 1/10 Pain location: right anterior shoulder  PRECAUTIONS: Other: Per shoulder scope protocol.   PATIENT GOALS:  Use right shoulder without pain.  OBJECTIVE:  TODAY'S TREATMENT:                                      EXERCISE LOG  Exercise Repetitions and Resistance Comments  Pulley 5 mins    Wall Ladder x10   UE Ranger  5 minutes.   Supine chest press  Anterior shoulder discomfort reported   Supine AAROM fleexion  Supine AAROM ER             Blank cell = exercise not performed today   Manual Therapy In supine:  PROM to patient's to right shoulder into flexion and ER with low load long duration stretching technique utilized f/b Rhythmic stab and muscle activation techniques at end ranges. PROM to 135 degrees. X-friction mass. To RT ACJ  Modalities  Date:  Unattended Estim: Shoulder,  ,   mins, Pain and Tone Vaso: Shoulder, 34 degrees, 10 mins, Pain and Edema  ASSESSMENT:  CLINICAL IMPRESSION: Pt arrived today doing fairly well with RT shldr and was able to continue with PROM as well as AAROM ex.'s and did well. He reached 135 degrees of Passive elevation today. X-friction mass. Performed to RT ACJ with some soreness, but did well. Vaso end of session.    OBJECTIVE IMPAIRMENTS decreased activity tolerance and pain.   ACTIVITY LIMITATIONS reach over head  PERSONAL FACTORS Time since onset of injury/illness/exacerbation are also affecting patient's functional outcome.   REHAB POTENTIAL: Excellent  CLINICAL DECISION MAKING: Stable/uncomplicated  EVALUATION COMPLEXITY: Low   GOALS:  LONG TERM GOALS: Target date: 08/29/2022  (Remove Blue Hyperlink)  Ind with HEP. Baseline:  Goal status: MET  2.  Active right shoulder flexion to 155 degrees so the patient can easily reach overhead. Baseline: 10/2: 146 degrees  Goal status: IN PROGRESS  3.  Active ER to 75 degrees+ to allow for easily donning/doffing of apparel. Baseline: 10/2: 46 degrees Goal status:  IN PROGRESS  4.  Perform ADL's with pain not > 2-3/10. Baseline: 10/2: depends on the day and activities Goal status: IN PROGRESS    PLAN: PT FREQUENCY: 2x/week  PT DURATION: 6 months  PLANNED INTERVENTIONS: Therapeutic exercises, Therapeutic activity, Neuromuscular re-education, Patient/Family education, Self Care, Electrical stimulation, Cryotherapy, Moist heat, Vasopneumatic device, Ultrasound, and Manual therapy  PLAN FOR NEXT SESSION: Please instruct patient in supine cane stretch to increase ER and countertop stretch to increase flexion.  Begin with gentle range of motion to right shoulder.  Progress to pulleys, wall ladder and UE Ranger.   Avrey Flanagin,CHRIS, PTA 07/29/2022, 12:36 PM

## 2022-08-01 ENCOUNTER — Ambulatory Visit: Payer: Worker's Compensation | Admitting: Physical Therapy

## 2022-08-01 DIAGNOSIS — G8929 Other chronic pain: Secondary | ICD-10-CM

## 2022-08-01 DIAGNOSIS — M25611 Stiffness of right shoulder, not elsewhere classified: Secondary | ICD-10-CM

## 2022-08-01 DIAGNOSIS — M6281 Muscle weakness (generalized): Secondary | ICD-10-CM

## 2022-08-01 DIAGNOSIS — M25511 Pain in right shoulder: Secondary | ICD-10-CM | POA: Diagnosis not present

## 2022-08-01 NOTE — Therapy (Signed)
OUTPATIENT PHYSICAL THERAPY SHOULDER TREATMENT   Patient Name: Craig Church MRN: [id number] DOB:1966/03/05, 56 y.o., male Today's Date: 08/01/2022   PT End of Session - 08/01/22 1445     Visit Number 9    Number of Visits 12    Date for PT Re-Evaluation 08/15/22    PT Start Time 0230    PT Stop Time 0316    PT Time Calculation (min) 46 min    Activity Tolerance Patient tolerated treatment well    Behavior During Therapy [facility] for tasks assessed/performed              Past Medical History:  Diagnosis Date   Hypertension    Seizure disorder [facility])    Past Surgical History:  Procedure Laterality Date   CHOLECYSTECTOMY     OPEN REDUCTION INTERNAL FIXATION (ORIF) DISTAL PHALANX Right 07/12/2021   Procedure: OPEN REDUCTION INTERNAL FIXATION (ORIF) DISTAL RADIUS;  Surgeon: Cindra Presume, MD;  Location: [place];  Service: Plastics;  Laterality: Right;   Patient Active Problem List   Diagnosis Date Noted   Fall 07/09/2021   Arthritis, multiple joint involvement 09/24/2019   Class 1 obesity due to excess calories with serious comorbidity and body mass index (BMI) of 32.0 to 32.9 in adult 09/24/2019   Essential hypertension 09/24/2019   Mixed hyperlipidemia 09/24/2019   Restless legs syndrome 09/24/2019   Allergic rhinitis 11/25/2013   FHx: heart disease 11/25/2013     REFERRING PROVIDER: Jean Rosenthal MD  REFERRING DIAG: S/p arthroscopy of right shoulder.  THERAPY DIAG:  Chronic right shoulder pain  Stiffness of right shoulder, not elsewhere classified  Muscle weakness (generalized)  Rationale for Evaluation and Treatment Rehabilitation  ONSET DATE: 06/16/22 (surgery date).  SUBJECTIVE:                                                                                                                                                                                      SUBJECTIVE STATEMENT: Reports RT shldr doing very well  PERTINENT HISTORY: Thoracic  compression fractures and right wrist ORIF due to above stated work-related injury.  PAIN:  Are you having pain? Yes: NPRS scale: 1/10 Pain location: right anterior shoulder  PRECAUTIONS: Other: Per shoulder scope protocol.   PATIENT GOALS:  Use right shoulder without pain.  OBJECTIVE:  TODAY'S TREATMENT:                                      EXERCISE LOG  Exercise Repetitions and Resistance Comments  UBE 120 RPM's 4 minutes forward and 4 minutes backward  Pulleys X 5   UE Ranger  5 minutes.   RW4  Yellow theraband to fatigue.                    Blank cell = exercise not performed today   Manual Therapy In supine:  PROM to patient's to right shoulder into flexion and ER with low load long duration stretching technique utilized x 6 minutes f/b Vasopneumatic on low with pillow between thorax and right elbow. HEP:  RW4, yellow theraband provided for home use.  ASSESSMENT:  CLINICAL IMPRESSION: Patient did very well with the addition of UBE and yellow theraband resisted RW4 exercises.  He performed with excellent technique and no increase in pain.  GOALS:  LONG TERM GOALS: Target date: 08/29/2022  (Remove Blue Hyperlink)  Ind with HEP. Baseline:  Goal status: MET  2.  Active right shoulder flexion to 155 degrees so the patient can easily reach overhead. Baseline: 10/2: 146 degrees  Goal status: IN PROGRESS  3.  Active ER to 75 degrees+ to allow for easily donning/doffing of apparel. Baseline: 10/2: 46 degrees Goal status: IN PROGRESS  4.  Perform ADL's with pain not > 2-3/10. Baseline: 10/2: depends on the day and activities Goal status: IN PROGRESS    PLAN: PT FREQUENCY: 2x/week  PT DURATION: 6 months  PLANNED INTERVENTIONS: Therapeutic exercises, Therapeutic activity, Neuromuscular re-education, Patient/Family education, Self Care, Electrical stimulation, Cryotherapy, Moist heat, Vasopneumatic device, Ultrasound, and Manual therapy  PLAN FOR NEXT  SESSION: Please instruct patient in supine cane stretch to increase ER and countertop stretch to increase flexion.  Begin with gentle range of motion to right shoulder.  Progress to pulleys, wall ladder and UE Ranger.   Adeel Guiffre, [place], PT 08/01/2022, 3:17 PM

## 2022-08-04 ENCOUNTER — Encounter: Payer: Self-pay | Admitting: *Deleted

## 2022-08-04 ENCOUNTER — Ambulatory Visit: Payer: Worker's Compensation | Admitting: *Deleted

## 2022-08-04 DIAGNOSIS — M25511 Pain in right shoulder: Secondary | ICD-10-CM | POA: Diagnosis not present

## 2022-08-04 DIAGNOSIS — G8929 Other chronic pain: Secondary | ICD-10-CM

## 2022-08-04 DIAGNOSIS — M6281 Muscle weakness (generalized): Secondary | ICD-10-CM

## 2022-08-04 DIAGNOSIS — M25611 Stiffness of right shoulder, not elsewhere classified: Secondary | ICD-10-CM

## 2022-08-04 NOTE — Therapy (Addendum)
OUTPATIENT PHYSICAL THERAPY SHOULDER TREATMENT   Patient Name: Craig Church MRN: [id number] DOB:1965/10/21, 56 y.o., male Today's Date: 08/04/2022   PT End of Session - 08/04/22 1348     Visit Number 10    Number of Visits 12    Date for PT Re-Evaluation 08/15/22    PT Start Time 53    PT Stop Time 1440    PT Time Calculation (min) 55 min              Past Medical History:  Diagnosis Date   Hypertension    Seizure disorder [facility])    Past Surgical History:  Procedure Laterality Date   CHOLECYSTECTOMY     OPEN REDUCTION INTERNAL FIXATION (ORIF) DISTAL PHALANX Right 07/12/2021   Procedure: OPEN REDUCTION INTERNAL FIXATION (ORIF) DISTAL RADIUS;  Surgeon: Cindra Presume, MD;  Location: [place];  Service: Plastics;  Laterality: Right;   Patient Active Problem List   Diagnosis Date Noted   Fall 07/09/2021   Arthritis, multiple joint involvement 09/24/2019   Class 1 obesity due to excess calories with serious comorbidity and body mass index (BMI) of 32.0 to 32.9 in adult 09/24/2019   Essential hypertension 09/24/2019   Mixed hyperlipidemia 09/24/2019   Restless legs syndrome 09/24/2019   Allergic rhinitis 11/25/2013   FHx: heart disease 11/25/2013     REFERRING PROVIDER: Jean Rosenthal MD  REFERRING DIAG: S/p arthroscopy of right shoulder.  THERAPY DIAG:  Chronic right shoulder pain  Stiffness of right shoulder, not elsewhere classified  Muscle weakness (generalized)  Rationale for Evaluation and Treatment Rehabilitation  ONSET DATE: 06/16/22 (surgery date).  SUBJECTIVE:                                                                                                                                                                                      SUBJECTIVE STATEMENT: Reports RT shldr doing very well still. Just sore mainly  PERTINENT HISTORY: Thoracic compression fractures and right wrist ORIF due to above stated work-related injury.  PAIN:  Are you  having pain? Yes: NPRS scale: 1/10 Pain location: right anterior shoulder  PRECAUTIONS: Other: Per shoulder scope protocol.   PATIENT GOALS:  Use right shoulder without pain.  OBJECTIVE:  TODAY'S TREATMENT:                                      EXERCISE LOG 10/19  Exercise Repetitions and Resistance Comments  UBE 120 RPM's 4 minutes forward and 4 minutes backward   Pulleys X 6 mins   UE Ranger     RW4  Yellow theraband to fatigue. With verbal and tactile cues for technique                    Blank cell = exercise not performed today   Manual Therapy In supine:  PROM to patient's to right shoulder into flexion ,ABD, and ER. Muscle activation techniques at end ranges and Rhythmic stab. Vaso  on low with pillow between thorax and right elbow.x 10 mins. LTG's are on-going at this time HEP:  RW4, yellow theraband provided for home use.  ASSESSMENT:  CLINICAL IMPRESSION: Patient did very well again  with the UBE and yellow theraband  RW4 exercises, but needed verbal and tactile cues for technique.  PROM, Rhythmic stab was performed for elevation as well as IR/ER. Elevation 145 degrees and ER 68 degrees GOALS:  LONG TERM GOALS: Target date: 08/29/2022  (Remove Blue Hyperlink)  Ind with HEP. Baseline:  Goal status: MET  2.  Active right shoulder flexion to 155 degrees so the patient can easily reach overhead. Baseline: 10/2: 146 degrees  Goal status: IN PROGRESS  3.  Active ER to 75 degrees+ to allow for easily donning/doffing of apparel. Baseline: 10/2: 46 degrees Goal status: IN PROGRESS  4.  Perform ADL's with pain not > 2-3/10. Baseline: 10/2: depends on the day and activities Goal status: IN PROGRESS    PLAN: PT FREQUENCY: 2x/week  PT DURATION: 6 months  PLANNED INTERVENTIONS: Therapeutic exercises, Therapeutic activity, Neuromuscular re-education, Patient/Family education, Self Care, Electrical stimulation, Cryotherapy, Moist heat, Vasopneumatic device,  Ultrasound, and Manual therapy  PLAN FOR NEXT SESSION: Please instruct patient in supine cane stretch to increase ER and countertop stretch to increase flexion.  Begin with gentle range of motion to right shoulder.  Progress to pulleys, wall ladder and UE Ranger.   Sakara Lehtinen,CHRIS, PTA 08/04/2022, 2:44 PM   Progress Note Reporting Period 07/04/22 to 08/05/22  See note below for Objective Data and Assessment of Progress/Goals. Patient progressing very well toward goals.

## 2022-08-08 ENCOUNTER — Ambulatory Visit: Payer: Worker's Compensation

## 2022-08-08 DIAGNOSIS — G8929 Other chronic pain: Secondary | ICD-10-CM

## 2022-08-08 DIAGNOSIS — M25511 Pain in right shoulder: Secondary | ICD-10-CM | POA: Diagnosis not present

## 2022-08-08 DIAGNOSIS — M25611 Stiffness of right shoulder, not elsewhere classified: Secondary | ICD-10-CM

## 2022-08-08 DIAGNOSIS — M6281 Muscle weakness (generalized): Secondary | ICD-10-CM

## 2022-08-08 NOTE — Therapy (Signed)
OUTPATIENT PHYSICAL THERAPY SHOULDER TREATMENT   Patient Name: Craig Church MRN: [id number] DOB:May 30, 1966, 56 y.o., male Today's Date: 08/08/2022   PT End of Session - 08/08/22 1432     Visit Number 11    Number of Visits 12    Date for PT Re-Evaluation 08/15/22    PT Start Time 1430    PT Stop Time 1528    PT Time Calculation (min) 58 min              Past Medical History:  Diagnosis Date   Hypertension    Seizure disorder [facility])    Past Surgical History:  Procedure Laterality Date   CHOLECYSTECTOMY     OPEN REDUCTION INTERNAL FIXATION (ORIF) DISTAL PHALANX Right 07/12/2021   Procedure: OPEN REDUCTION INTERNAL FIXATION (ORIF) DISTAL RADIUS;  Surgeon: Cindra Presume, MD;  Location: [place];  Service: Plastics;  Laterality: Right;   Patient Active Problem List   Diagnosis Date Noted   Fall 07/09/2021   Arthritis, multiple joint involvement 09/24/2019   Class 1 obesity due to excess calories with serious comorbidity and body mass index (BMI) of 32.0 to 32.9 in adult 09/24/2019   Essential hypertension 09/24/2019   Mixed hyperlipidemia 09/24/2019   Restless legs syndrome 09/24/2019   Allergic rhinitis 11/25/2013   FHx: heart disease 11/25/2013     REFERRING PROVIDER: Jean Rosenthal MD  REFERRING DIAG: S/p arthroscopy of right shoulder.  THERAPY DIAG:  Chronic right shoulder pain  Stiffness of right shoulder, not elsewhere classified  Muscle weakness (generalized)  Rationale for Evaluation and Treatment Rehabilitation  ONSET DATE: 06/16/22 (surgery date).  SUBJECTIVE:                                                                                                                                                                                      SUBJECTIVE STATEMENT: Reports RT shldr doing very well still. Just sore mainly  PERTINENT HISTORY: Thoracic compression fractures and right wrist ORIF due to above stated work-related injury.  PAIN:  Are you  having pain? Yes: NPRS scale: 1/10 Pain location: right anterior shoulder  PRECAUTIONS: Other: Per shoulder scope protocol.   PATIENT GOALS:  Use right shoulder without pain.  OBJECTIVE:  TODAY'S TREATMENT:                                      EXERCISE LOG 10/19  Exercise Repetitions and Resistance Comments  UBE 120 RPM's 5 minutes forward and 5 minutes backward   Pulleys X 6 mins   UE Ranger  Flex/Extension x 3 mins  RW4  Yellow theraband x 25 reps   Supine Flexion 15 reps no weight   Supine Abduction 15 reps no weight            Blank cell = exercise not performed today   Manual Therapy In supine:  PROM to patient's to right shoulder into flexion ,ABD, and ER.  Vaso  on low with pillow between thorax and right elbow.x 10 mins.   HEP:  RW4, yellow theraband provided for home use.  ASSESSMENT:  CLINICAL IMPRESSION: Pt arrives for today's treatment session denying any pain.  Pt able to tolerate increased reps with t-band exercises today without issue. Pt requiring consistent cues for proper technique with ER.  Pt introduced to supine flexion and abduction without weight.  Pt with mild increase in pain after completion of these exercises.  PROM performed in all directions with gentle hold at end range to assist with increased ROM.  Normal responses to vaso noted upon removal.  Pt reported 2/10 right shoulder pain at completion of today's treatment session.   GOALS:  LONG TERM GOALS: Target date: 08/29/2022  (Remove Blue Hyperlink)  Ind with HEP. Baseline:  Goal status: MET  2.  Active right shoulder flexion to 155 degrees so the patient can easily reach overhead. Baseline: 10/2: 146 degrees  Goal status: IN PROGRESS  3.  Active ER to 75 degrees+ to allow for easily donning/doffing of apparel. Baseline: 10/2: 46 degrees Goal status: IN PROGRESS  4.  Perform ADL's with pain not > 2-3/10. Baseline: 10/2: depends on the day and activities Goal status: IN  PROGRESS    PLAN: PT FREQUENCY: 2x/week  PT DURATION: 6 months  PLANNED INTERVENTIONS: Therapeutic exercises, Therapeutic activity, Neuromuscular re-education, Patient/Family education, Self Care, Electrical stimulation, Cryotherapy, Moist heat, Vasopneumatic device, Ultrasound, and Manual therapy  PLAN FOR NEXT SESSION: Please instruct patient in supine cane stretch to increase ER and countertop stretch to increase flexion.  Begin with gentle range of motion to right shoulder.  Progress to pulleys, wall ladder and UE Ranger.   Kathrynn Ducking, PTA 08/08/2022, 3:36 PM

## 2022-08-11 ENCOUNTER — Ambulatory Visit: Payer: Worker's Compensation | Admitting: *Deleted

## 2022-08-11 DIAGNOSIS — M6281 Muscle weakness (generalized): Secondary | ICD-10-CM

## 2022-08-11 DIAGNOSIS — M25611 Stiffness of right shoulder, not elsewhere classified: Secondary | ICD-10-CM

## 2022-08-11 DIAGNOSIS — G8929 Other chronic pain: Secondary | ICD-10-CM

## 2022-08-11 DIAGNOSIS — M25511 Pain in right shoulder: Secondary | ICD-10-CM | POA: Diagnosis not present

## 2022-08-11 NOTE — Therapy (Signed)
OUTPATIENT PHYSICAL THERAPY SHOULDER TREATMENT   Patient Name: Craig Church MRN: [id number] DOB:[birth year], 56 y.o., male Today's Date: 08/11/2022   PT End of Session - 08/11/22 1444     Visit Number 12    Number of Visits 12    Date for PT Re-Evaluation 08/15/22    PT Start Time 60    PT Stop Time 1520    PT Time Calculation (min) 50 min              Past Medical History:  Diagnosis Date   Hypertension    Seizure disorder [facility])    Past Surgical History:  Procedure Laterality Date   CHOLECYSTECTOMY     OPEN REDUCTION INTERNAL FIXATION (ORIF) DISTAL PHALANX Right 07/12/2021   Procedure: OPEN REDUCTION INTERNAL FIXATION (ORIF) DISTAL RADIUS;  Surgeon: Cindra Presume, MD;  Location: [place];  Service: Plastics;  Laterality: Right;   Patient Active Problem List   Diagnosis Date Noted   Fall 07/09/2021   Arthritis, multiple joint involvement 09/24/2019   Class 1 obesity due to excess calories with serious comorbidity and body mass index (BMI) of 32.0 to 32.9 in adult 09/24/2019   Essential hypertension 09/24/2019   Mixed hyperlipidemia 09/24/2019   Restless legs syndrome 09/24/2019   Allergic rhinitis 11/25/2013   FHx: heart disease 11/25/2013     REFERRING PROVIDER: Jean Rosenthal MD  REFERRING DIAG: S/p arthroscopy of right shoulder.  THERAPY DIAG:  Chronic right shoulder pain  Stiffness of right shoulder, not elsewhere classified  Muscle weakness (generalized)  Rationale for Evaluation and Treatment Rehabilitation  ONSET DATE: 06/16/22 (surgery date).  SUBJECTIVE:                                                                                                                                                                                      SUBJECTIVE STATEMENT: Reports RT shldr doing fairly well 2/10  PERTINENT HISTORY: Thoracic compression fractures and right wrist ORIF due to above stated work-related injury.  PAIN:  Are you having pain?  Yes: NPRS scale: 2/10 Pain location: right anterior shoulder  PRECAUTIONS: Other: Per shoulder scope protocol.   PATIENT GOALS:  Use right shoulder without pain.  OBJECTIVE:  TODAY'S TREATMENT:                                      EXERCISE LOG 10/26  Exercise Repetitions and Resistance Comments  UBE 120 RPM's 5 minutes forward and 5 minutes backward Discussed posture with EXs to decrease anterior scapular tilt.  Pulleys X 6 mins   UE Ranger  CW/CCW 3x10 each way   RW4  Yellow theraband 2x10 -15 reps   Shldr ext  yellow tband 2x10-15   Supine Flexion 15 reps no weight   Supine Abduction    Supine CW/CCW circles 3x10 each way        Blank cell = exercise not performed today   Manual Therapy       STW/ TPR  to RT pec minor F/B In supine:  PROM / AAROM to patient's to right shoulder into flexion as well as end range isometrics     HEP:    ASSESSMENT:  CLINICAL IMPRESSION:  Pt arrived today doing fairly well with mainly RT shldr soreness. He was able to continue with progression of exs and did well. STW to release tight pec minor was tolerated well. LTGs NM yet due to ROM deficits and some pain with ADL's. AROM 145 degrees flexion and ER to 65 degrees.   GOALS:  LONG TERM GOALS: Target date: 08/29/2022  (Remove Blue Hyperlink)  Ind with HEP. Baseline:  Goal status: MET  2.  Active right shoulder flexion to 155 degrees so the patient can easily reach overhead. Baseline: 10/2: 146 degrees  Goal status: IN PROGRESS  3.  Active ER to 75 degrees+ to allow for easily donning/doffing of apparel. Baseline: 10/2: 46 degrees Goal status: IN PROGRESS  4.  Perform ADL's with pain not > 2-3/10. Baseline: 10/2: depends on the day and activities Goal status: IN PROGRESS    PLAN: PT FREQUENCY: 2x/week  PT DURATION: 6 months  PLANNED INTERVENTIONS: Therapeutic exercises, Therapeutic activity, Neuromuscular re-education, Patient/Family education, Self Care, Electrical  stimulation, Cryotherapy, Moist heat, Vasopneumatic device, Ultrasound, and Manual therapy  PLAN FOR NEXT SESSION: Please instruct patient in supine cane stretch to increase ER and countertop stretch to increase flexion.  Begin with gentle range of motion to right shoulder.  Progress to pulleys, wall ladder and UE Ranger.   Lynze Reddy,CHRIS, PTA 08/11/2022, 6:07 PM

## 2022-08-15 ENCOUNTER — Ambulatory Visit: Payer: Worker's Compensation | Admitting: Physical Therapy

## 2022-08-15 DIAGNOSIS — M25611 Stiffness of right shoulder, not elsewhere classified: Secondary | ICD-10-CM

## 2022-08-15 DIAGNOSIS — M6281 Muscle weakness (generalized): Secondary | ICD-10-CM

## 2022-08-15 DIAGNOSIS — M25511 Pain in right shoulder: Secondary | ICD-10-CM | POA: Diagnosis not present

## 2022-08-15 DIAGNOSIS — G8929 Other chronic pain: Secondary | ICD-10-CM

## 2022-08-15 NOTE — Therapy (Signed)
OUTPATIENT PHYSICAL THERAPY SHOULDER TREATMENT   Patient Name: Craig Church MRN: [id number] DOB:[birth year], 56 y.o., male Today's Date: 08/15/2022   PT End of Session - 08/15/22 1507     Visit Number 13    Number of Visits 18    Date for PT Re-Evaluation 09/05/22    PT Start Time 0230    PT Stop Time 0322    PT Time Calculation (min) 52 min    Activity Tolerance Patient tolerated treatment well    Behavior During Therapy [facility] for tasks assessed/performed              Past Medical History:  Diagnosis Date   Hypertension    Seizure disorder [facility])    Past Surgical History:  Procedure Laterality Date   CHOLECYSTECTOMY     OPEN REDUCTION INTERNAL FIXATION (ORIF) DISTAL PHALANX Right 07/12/2021   Procedure: OPEN REDUCTION INTERNAL FIXATION (ORIF) DISTAL RADIUS;  Surgeon: Cindra Presume, MD;  Location: [place];  Service: Plastics;  Laterality: Right;   Patient Active Problem List   Diagnosis Date Noted   Fall 07/09/2021   Arthritis, multiple joint involvement 09/24/2019   Class 1 obesity due to excess calories with serious comorbidity and body mass index (BMI) of 32.0 to 32.9 in adult 09/24/2019   Essential hypertension 09/24/2019   Mixed hyperlipidemia 09/24/2019   Restless legs syndrome 09/24/2019   Allergic rhinitis 11/25/2013   FHx: heart disease 11/25/2013     REFERRING PROVIDER: Jean Rosenthal MD  REFERRING DIAG: S/p arthroscopy of right shoulder.  THERAPY DIAG:  Chronic right shoulder pain  Stiffness of right shoulder, not elsewhere classified  Muscle weakness (generalized)  Rationale for Evaluation and Treatment Rehabilitation  ONSET DATE: 06/16/22 (surgery date).  SUBJECTIVE:                                                                                                                                                                                      SUBJECTIVE STATEMENT: Sore in front of shoulder. PERTINENT HISTORY: Thoracic compression  fractures and right wrist ORIF due to above stated work-related injury.  PAIN:  Are you having pain? Yes: NPRS scale: 2/10 Pain location: right anterior shoulder  PRECAUTIONS: Other: Per shoulder scope protocol.   PATIENT GOALS:  Use right shoulder without pain.  OBJECTIVE:  TODAY'S TREATMENT:                                      EXERCISE LOG 10/26  Exercise Repetitions and Resistance Comments  UBE 120 RPM's x 8 minutes at 90 RPM's. Discussed posture with EXs  to decrease anterior scapular tilt.  Pulleys X 5 mins   UE Ranger  X 4 minutes.                            Blank cell = exercise not performed today   Combo e'stim/US at 1.50 W/CM2 x 8 minutes to patient's right anterior shoulder region f/b IFC at 80-150 Hz on 40% scan x 20 minutes.  Normal modality response following removal of modality.    HEP:    ASSESSMENT:  CLINICAL IMPRESSION:  Pt arrived today doing well with mainly RT shldr soreness in anterior, bicipital groove area. Performed combo e'stim/US to this area which tolerated without complaint.   GOALS:  LONG TERM GOALS: Target date: 08/29/2022  (Remove Blue Hyperlink)  Ind with HEP. Baseline:  Goal status: MET  2.  Active right shoulder flexion to 155 degrees so the patient can easily reach overhead. Baseline: 10/2: 146 degrees  Goal status: IN PROGRESS  3.  Active ER to 75 degrees+ to allow for easily donning/doffing of apparel. Baseline: 10/2: 46 degrees Goal status: IN PROGRESS  4.  Perform ADL's with pain not > 2-3/10. Baseline: 10/2: depends on the day and activities Goal status: IN PROGRESS    PLAN: PT FREQUENCY: 2x/week  PT DURATION: 6 months  PLANNED INTERVENTIONS: Therapeutic exercises, Therapeutic activity, Neuromuscular re-education, Patient/Family education, Self Care, Electrical stimulation, Cryotherapy, Moist heat, Vasopneumatic device, Ultrasound, and Manual therapy  PLAN FOR NEXT SESSION: Please instruct patient in supine  cane stretch to increase ER and countertop stretch to increase flexion.  Begin with gentle range of motion to right shoulder.  Progress to pulleys, wall ladder and UE Ranger.   [place], [place], PT 08/15/2022, 3:28 PM

## 2022-08-17 ENCOUNTER — Encounter: Payer: Self-pay | Admitting: Orthopaedic Surgery

## 2022-08-18 ENCOUNTER — Ambulatory Visit: Payer: Worker's Compensation | Attending: Orthopaedic Surgery

## 2022-08-18 DIAGNOSIS — G8929 Other chronic pain: Secondary | ICD-10-CM | POA: Insufficient documentation

## 2022-08-18 DIAGNOSIS — M25611 Stiffness of right shoulder, not elsewhere classified: Secondary | ICD-10-CM | POA: Insufficient documentation

## 2022-08-18 DIAGNOSIS — M6281 Muscle weakness (generalized): Secondary | ICD-10-CM | POA: Insufficient documentation

## 2022-08-18 DIAGNOSIS — M25511 Pain in right shoulder: Secondary | ICD-10-CM | POA: Diagnosis not present

## 2022-08-18 NOTE — Therapy (Signed)
OUTPATIENT PHYSICAL THERAPY SHOULDER TREATMENT   Patient Name: Craig Church MRN: [id number] DOB:18-Aug-1966, 56 y.o., male Today's Date: 08/18/2022   PT End of Session - 08/18/22 1436     Visit Number 14    Number of Visits 18    Date for PT Re-Evaluation 09/05/22    PT Start Time 1430    PT Stop Time 1515    PT Time Calculation (min) 45 min    Activity Tolerance Patient tolerated treatment well    Behavior During Therapy [facility] for tasks assessed/performed              Past Medical History:  Diagnosis Date   Hypertension    Seizure disorder ([place])    Past Surgical History:  Procedure Laterality Date   CHOLECYSTECTOMY     OPEN REDUCTION INTERNAL FIXATION (ORIF) DISTAL PHALANX Right 07/12/2021   Procedure: OPEN REDUCTION INTERNAL FIXATION (ORIF) DISTAL RADIUS;  Surgeon: Cindra Presume, MD;  Location: [place];  Service: Plastics;  Laterality: Right;   Patient Active Problem List   Diagnosis Date Noted   Fall 07/09/2021   Arthritis, multiple joint involvement 09/24/2019   Class 1 obesity due to excess calories with serious comorbidity and body mass index (BMI) of 32.0 to 32.9 in adult 09/24/2019   Essential hypertension 09/24/2019   Mixed hyperlipidemia 09/24/2019   Restless legs syndrome 09/24/2019   Allergic rhinitis 11/25/2013   FHx: heart disease 11/25/2013     REFERRING PROVIDER: Jean Rosenthal MD  REFERRING DIAG: S/p arthroscopy of right shoulder.  THERAPY DIAG:  Chronic right shoulder pain  Stiffness of right shoulder, not elsewhere classified  Muscle weakness (generalized)  Rationale for Evaluation and Treatment Rehabilitation  ONSET DATE: 06/16/22 (surgery date).  SUBJECTIVE:                                                                                                                                                                                      SUBJECTIVE STATEMENT: Pt reports 2/10 anterior shoulder pain.  Reports relief after last  treatment session.  PERTINENT HISTORY: Thoracic compression fractures and right wrist ORIF due to above stated work-related injury.  PAIN:  Are you having pain? Yes: NPRS scale: 2/10 Pain location: right anterior shoulder  PRECAUTIONS: Other: Per shoulder scope protocol.   PATIENT GOALS:  Use right shoulder without pain.  OBJECTIVE:  TODAY'S TREATMENT:                                11/2   EXERCISE LOG   Exercise Repetitions and Resistance Comments  UBE x 10 minutes at 90 RPM's.  Discussed posture with EXs to decrease anterior scapular tilt.  Pulleys X 5 mins   UE Ranger  X 4 minutes.   RW 4 Yellow t-band x 20 reps   Shoulder extension Yellos t-band x 20 reps    Blank cell = exercise not performed today   Combo e'stim/US at 1.50 W/CM2 x 8 minutes to patient's right anterior shoulder region f/b IFC at 80-150 Hz on 40% scan x 10 minutes.  Normal modality response following removal of modality.    HEP:    ASSESSMENT:  CLINICAL IMPRESSION:  Pt arrives for today's treatment 2/10 anterior right shoulder pain.  Session reviewed exercises previously performed with min cues required for proper technique with IR and ER.  Pt reports relief from combo.  Normal responses to all modalities performed.  Pt denied any pain at completion of today's treatment session.    GOALS:  LONG TERM GOALS: Target date: 08/29/2022  (Remove Blue Hyperlink)  Ind with HEP. Baseline:  Goal status: MET  2.  Active right shoulder flexion to 155 degrees so the patient can easily reach overhead. Baseline: 10/2: 146 degrees  Goal status: IN PROGRESS  3.  Active ER to 75 degrees+ to allow for easily donning/doffing of apparel. Baseline: 10/2: 46 degrees Goal status: IN PROGRESS  4.  Perform ADL's with pain not > 2-3/10. Baseline: 10/2: depends on the day and activities Goal status: IN PROGRESS    PLAN: PT FREQUENCY: 2x/week  PT DURATION: 6 months  PLANNED INTERVENTIONS: Therapeutic exercises,  Therapeutic activity, Neuromuscular re-education, Patient/Family education, Self Care, Electrical stimulation, Cryotherapy, Moist heat, Vasopneumatic device, Ultrasound, and Manual therapy  PLAN FOR NEXT SESSION: Please instruct patient in supine cane stretch to increase ER and countertop stretch to increase flexion.  Begin with gentle range of motion to right shoulder.  Progress to pulleys, wall ladder and UE Ranger.   Kathrynn Ducking, PTA 08/18/2022, 3:26 PM

## 2022-08-22 ENCOUNTER — Encounter: Payer: Self-pay | Admitting: Physical Therapy

## 2022-08-22 ENCOUNTER — Ambulatory Visit: Payer: Worker's Compensation

## 2022-08-22 DIAGNOSIS — G8929 Other chronic pain: Secondary | ICD-10-CM

## 2022-08-22 DIAGNOSIS — M25611 Stiffness of right shoulder, not elsewhere classified: Secondary | ICD-10-CM

## 2022-08-22 DIAGNOSIS — M25511 Pain in right shoulder: Secondary | ICD-10-CM | POA: Diagnosis not present

## 2022-08-22 DIAGNOSIS — M6281 Muscle weakness (generalized): Secondary | ICD-10-CM

## 2022-08-22 NOTE — Therapy (Signed)
OUTPATIENT PHYSICAL THERAPY SHOULDER TREATMENT   Patient Name: Craig Church MRN: [id number] DOB:1966-02-22, 56 y.o., male Today's Date: 08/22/2022   PT End of Session - 08/22/22 0819     Visit Number 15    Number of Visits 18    Date for PT Re-Evaluation 09/05/22    PT Start Time 0815    PT Stop Time 0912    PT Time Calculation (min) 57 min    Activity Tolerance Patient tolerated treatment well    Behavior During Therapy [facility] for tasks assessed/performed              Past Medical History:  Diagnosis Date   Hypertension    Seizure disorder [facility])    Past Surgical History:  Procedure Laterality Date   CHOLECYSTECTOMY     OPEN REDUCTION INTERNAL FIXATION (ORIF) DISTAL PHALANX Right 07/12/2021   Procedure: OPEN REDUCTION INTERNAL FIXATION (ORIF) DISTAL RADIUS;  Surgeon: Cindra Presume, MD;  Location: [place];  Service: Plastics;  Laterality: Right;   Patient Active Problem List   Diagnosis Date Noted   Fall 07/09/2021   Arthritis, multiple joint involvement 09/24/2019   Class 1 obesity due to excess calories with serious comorbidity and body mass index (BMI) of 32.0 to 32.9 in adult 09/24/2019   Essential hypertension 09/24/2019   Mixed hyperlipidemia 09/24/2019   Restless legs syndrome 09/24/2019   Allergic rhinitis 11/25/2013   FHx: heart disease 11/25/2013     REFERRING PROVIDER: Jean Rosenthal MD  REFERRING DIAG: S/p arthroscopy of right shoulder.  THERAPY DIAG:  Chronic right shoulder pain  Stiffness of right shoulder, not elsewhere classified  Muscle weakness (generalized)  Rationale for Evaluation and Treatment Rehabilitation  ONSET DATE: 06/16/22 (surgery date).  SUBJECTIVE:                                                                                                                                                                                      SUBJECTIVE STATEMENT: Reports 5/10 right shoulder pain today, pt reports going fishing over the  weekend for the first time since surgery.  PERTINENT HISTORY: Thoracic compression fractures and right wrist ORIF due to above stated work-related injury.  PAIN:  Are you having pain? Yes: NPRS scale: 5/10 Pain location: right anterior shoulder  PRECAUTIONS: Other: Per shoulder scope protocol.   PATIENT GOALS:  Use right shoulder without pain.  OBJECTIVE:  TODAY'S TREATMENT:                                11/6   EXERCISE LOG   Exercise Repetitions and Resistance Comments  UBE  x 10 minutes at 90 RPM's. Discussed posture with EXs to decrease anterior scapular tilt.  Pulleys X 5 mins   UE Ranger  X 4 minutes.   RW 4 Yellow t-band x 25 reps   Shoulder extension Yellos t-band x 25 reps    Blank cell = exercise not performed today   Manual Therapy Soft Tissue Mobilization: right shoulder, STW/M performed to right posterior shoulder to decrease pain and tone     Modalities  Date:  Unattended Estim: Shoulder, Pre-Mod 80-150 Hz, 15 mins, Pain and Tone Vaso: Shoulder, 34 degrees, 15 mins, Pain    ASSESSMENT:  CLINICAL IMPRESSION:  Pt arrives for today's treatment session reporting 5/10 right shoulder pain.  Pt reports fishing over the weekend which has caused increased pain.  Pt able to tolerate increased reps with all t-band exercises today.  STW/M performed to right posterior shoulder to decrease pain and tone.  Normal responses to estim and vaso noted upon removal.  Pt reported 3/10 right shoulder pain at completion.     GOALS:  LONG TERM GOALS: Target date: 08/29/2022  (Remove Blue Hyperlink)  Ind with HEP. Baseline:  Goal status: MET  2.  Active right shoulder flexion to 155 degrees so the patient can easily reach overhead. Baseline: 10/2: 146 degrees  Goal status: IN PROGRESS  3.  Active ER to 75 degrees+ to allow for easily donning/doffing of apparel. Baseline: 10/2: 46 degrees Goal status: IN PROGRESS  4.  Perform ADL's with pain not > 2-3/10. Baseline: 10/2:  depends on the day and activities;  Goal status: IN PROGRESS    PLAN: PT FREQUENCY: 2x/week  PT DURATION: 6 months  PLANNED INTERVENTIONS: Therapeutic exercises, Therapeutic activity, Neuromuscular re-education, Patient/Family education, Self Care, Electrical stimulation, Cryotherapy, Moist heat, Vasopneumatic device, Ultrasound, and Manual therapy  PLAN FOR NEXT SESSION: Please instruct patient in supine cane stretch to increase ER and countertop stretch to increase flexion.  Begin with gentle range of motion to right shoulder.  Progress to pulleys, wall ladder and UE Ranger.   Kathrynn Ducking, PTA 08/22/2022, 9:14 AM

## 2022-08-25 ENCOUNTER — Encounter: Payer: Self-pay | Admitting: Physician Assistant

## 2022-08-25 ENCOUNTER — Ambulatory Visit: Payer: Worker's Compensation

## 2022-08-25 ENCOUNTER — Ambulatory Visit (INDEPENDENT_AMBULATORY_CARE_PROVIDER_SITE_OTHER): Payer: Worker's Compensation | Admitting: Physician Assistant

## 2022-08-25 DIAGNOSIS — Z9889 Other specified postprocedural states: Secondary | ICD-10-CM

## 2022-08-25 DIAGNOSIS — G8929 Other chronic pain: Secondary | ICD-10-CM

## 2022-08-25 DIAGNOSIS — M6281 Muscle weakness (generalized): Secondary | ICD-10-CM

## 2022-08-25 DIAGNOSIS — M25511 Pain in right shoulder: Secondary | ICD-10-CM | POA: Diagnosis not present

## 2022-08-25 DIAGNOSIS — M25611 Stiffness of right shoulder, not elsewhere classified: Secondary | ICD-10-CM

## 2022-08-25 NOTE — Progress Notes (Signed)
HPI: [name] returns.  Follow-up 10 weeks status post right shoulder arthroscopy.  Shoulder pain secondary to an 8 foot fall at work.  He has been going to formal therapy.  Feels therapy is beneficial he also feels that the subacromial injection given by Dr. Magnus Church 4 weeks ago was helpful.  He states his pain today is 2 out of 10 pain at its worst and does have some popping in the shoulder at times.  Reports that his pain can be up to 6 out of 10 pain after finishing with therapy.  He is taking no pain medications no anti-inflammatories.  He is out of work at this point time.  Review of systems: See HPI  Physical exam general well-developed well-nourished male no acute distress.  Affect appropriate. Right shoulder forward flexion to approximately 150 degrees.  Limited external rotation.  Impression: 10 weeks status post right shoulder arthroscopy with partial acromioplasty  Plan: Recommend continued physical therapy for the next 6 to 8 weeks.  We will keep him out of work at this time.  He will follow-up with [place] in 6 weeks to check his progress.  Questions were encouraged and answered at length today.  Patient states that he will contact his case manager and let them know of today's recommendations.

## 2022-08-25 NOTE — Therapy (Signed)
OUTPATIENT PHYSICAL THERAPY SHOULDER TREATMENT   Patient Name: Craig Church MRN: [id number] DOB:May 07, 1966, 56 y.o., male Today's Date: 08/25/2022   PT End of Session - 08/25/22 0821     Visit Number 16    Number of Visits 18    Date for PT Re-Evaluation 09/05/22    PT Start Time 0815    PT Stop Time 0915    PT Time Calculation (min) 60 min    Activity Tolerance Patient tolerated treatment well    Behavior During Therapy [facility] for tasks assessed/performed              Past Medical History:  Diagnosis Date   Hypertension    Seizure disorder [facility])    Past Surgical History:  Procedure Laterality Date   CHOLECYSTECTOMY     OPEN REDUCTION INTERNAL FIXATION (ORIF) DISTAL PHALANX Right 07/12/2021   Procedure: OPEN REDUCTION INTERNAL FIXATION (ORIF) DISTAL RADIUS;  Surgeon: Cindra Presume, MD;  Location: [place];  Service: Plastics;  Laterality: Right;   Patient Active Problem List   Diagnosis Date Noted   Fall 07/09/2021   Arthritis, multiple joint involvement 09/24/2019   Class 1 obesity due to excess calories with serious comorbidity and body mass index (BMI) of 32.0 to 32.9 in adult 09/24/2019   Essential hypertension 09/24/2019   Mixed hyperlipidemia 09/24/2019   Restless legs syndrome 09/24/2019   Allergic rhinitis 11/25/2013   FHx: heart disease 11/25/2013     REFERRING PROVIDER: Jean Rosenthal MD  REFERRING DIAG: S/p arthroscopy of right shoulder.  THERAPY DIAG:  Chronic right shoulder pain  Stiffness of right shoulder, not elsewhere classified  Muscle weakness (generalized)  Rationale for Evaluation and Treatment Rehabilitation  ONSET DATE: 06/16/22 (surgery date).  SUBJECTIVE:                                                                                                                                                                                      SUBJECTIVE STATEMENT: Pt reports 2/10 right shoulder pain today.  PERTINENT HISTORY: Thoracic  compression fractures and right wrist ORIF due to above stated work-related injury.  PAIN:  Are you having pain? Yes: NPRS scale: 2/10 Pain location: right anterior shoulder  PRECAUTIONS: Other: Per shoulder scope protocol.   PATIENT GOALS:  Use right shoulder without pain.  OBJECTIVE:  TODAY'S TREATMENT:                                11/9  EXERCISE LOG   Exercise Repetitions and Resistance Comments  UBE x 10 minutes at 90 RPM's   Pulleys X 5 mins  UE Ranger  X 4 minutes.   RW 4 Green t-band x 25 reps   Shoulder extension Green t-band x 25 reps    Blank cell = exercise not performed today   Manual Therapy Soft Tissue Mobilization: right shoulder, STW/M performed to right posterior shoulder to decrease pain and tone     Modalities  Date:  Unattended Estim: Shoulder, Pre-Mod 80-150 Hz, 15 mins, Pain and Tone Vaso: Shoulder, 34 degrees, 15 mins, Pain    ASSESSMENT:  CLINICAL IMPRESSION:  Pt arrives for today's treatment session reporting 2/10 right shoulder pain. Pt able to tolerate increased resistance with all t-band exercises.  Pt able to demonstrated 151 degrees of active right shoulder flexion and 64 degrees of right shoulder external rotation.  Pt is making good progress towards all of his goals.  His pain level with ADLs is dependent on the activities done that day.  Normal responses to estim and vaso noted upon removal.  Pt denied any pain at completion of today's treatment session.   GOALS:  LONG TERM GOALS: Target date: 08/29/2022  (Remove Blue Hyperlink)  Ind with HEP. Baseline:  Goal status: MET  2.  Active right shoulder flexion to 155 degrees so the patient can easily reach overhead. Baseline: 10/2: 146 degrees; 11/9: 151 degrees Goal status: IN PROGRESS  3.  Active ER to 75 degrees+ to allow for easily donning/doffing of apparel. Baseline: 10/2: 46 degrees; 11/9: 64 degress Goal status: IN PROGRESS  4.  Perform ADL's with pain not >  2-3/10. Baseline: 10/2: depends on the day and activities; 11/9: depends on the day and activities Goal status: IN PROGRESS    PLAN: PT FREQUENCY: 2x/week  PT DURATION: 6 months  PLANNED INTERVENTIONS: Therapeutic exercises, Therapeutic activity, Neuromuscular re-education, Patient/Family education, Self Care, Electrical stimulation, Cryotherapy, Moist heat, Vasopneumatic device, Ultrasound, and Manual therapy  PLAN FOR NEXT SESSION: Please instruct patient in supine cane stretch to increase ER and countertop stretch to increase flexion.  Begin with gentle range of motion to right shoulder.  Progress to pulleys, wall ladder and UE Ranger.   Kathrynn Ducking, PTA 08/25/2022, 9:17 AM

## 2022-08-29 ENCOUNTER — Ambulatory Visit: Payer: Worker's Compensation

## 2022-08-29 DIAGNOSIS — M6281 Muscle weakness (generalized): Secondary | ICD-10-CM

## 2022-08-29 DIAGNOSIS — M25611 Stiffness of right shoulder, not elsewhere classified: Secondary | ICD-10-CM

## 2022-08-29 DIAGNOSIS — G8929 Other chronic pain: Secondary | ICD-10-CM

## 2022-08-29 DIAGNOSIS — M25511 Pain in right shoulder: Secondary | ICD-10-CM | POA: Diagnosis not present

## 2022-08-29 NOTE — Therapy (Signed)
OUTPATIENT PHYSICAL THERAPY SHOULDER TREATMENT   Patient Name: Craig Church MRN: [id number] DOB:1965-12-14, 56 y.o., male Today's Date: 08/29/2022   PT End of Session - 08/29/22 0821     Visit Number 17    Number of Visits 18    Date for PT Re-Evaluation 09/05/22    PT Start Time 0818    PT Stop Time 0913    PT Time Calculation (min) 55 min    Activity Tolerance Patient tolerated treatment well    Behavior During Therapy [facility] for tasks assessed/performed              Past Medical History:  Diagnosis Date   Hypertension    Seizure disorder [facility])    Past Surgical History:  Procedure Laterality Date   CHOLECYSTECTOMY     OPEN REDUCTION INTERNAL FIXATION (ORIF) DISTAL PHALANX Right 07/12/2021   Procedure: OPEN REDUCTION INTERNAL FIXATION (ORIF) DISTAL RADIUS;  Surgeon: Cindra Presume, MD;  Location: [place];  Service: Plastics;  Laterality: Right;   Patient Active Problem List   Diagnosis Date Noted   Fall 07/09/2021   Arthritis, multiple joint involvement 09/24/2019   Class 1 obesity due to excess calories with serious comorbidity and body mass index (BMI) of 32.0 to 32.9 in adult 09/24/2019   Essential hypertension 09/24/2019   Mixed hyperlipidemia 09/24/2019   Restless legs syndrome 09/24/2019   Allergic rhinitis 11/25/2013   FHx: heart disease 11/25/2013     REFERRING PROVIDER: Jean Rosenthal MD  REFERRING DIAG: S/p arthroscopy of right shoulder.  THERAPY DIAG:  Chronic right shoulder pain  Stiffness of right shoulder, not elsewhere classified  Muscle weakness (generalized)  Rationale for Evaluation and Treatment Rehabilitation  ONSET DATE: 06/16/22 (surgery date).  SUBJECTIVE:                                                                                                                                                                                      SUBJECTIVE STATEMENT: Pt reports 6/10 right shoulder pain today.  Pt reports fishing over the  weekend.   PERTINENT HISTORY: Thoracic compression fractures and right wrist ORIF due to above stated work-related injury.  PAIN:  Are you having pain? Yes: NPRS scale: 6/10 Pain location: right anterior shoulder  PRECAUTIONS: Other: Per shoulder scope protocol.   PATIENT GOALS:  Use right shoulder without pain.  OBJECTIVE:  TODAY'S TREATMENT:                                11/13  EXERCISE LOG   Exercise Repetitions and Resistance Comments  UBE x 10 minutes at 90  RPM's   Pulleys X 5 mins   UE Ranger  X 4 minutes.   RW 4 Green t-band x 25 reps   Shoulder extension Green t-band x 25 reps    Blank cell = exercise not performed today   Manual Therapy Passive ROM: right shoulder, PROM into flexion and ER with concentration on ER to increase ROM     Modalities  Date:  Unattended Estim: Shoulder, Pre-Mod 80-150 Hz, 15 mins, Pain and Tone Vaso: Shoulder, 34 degrees, 15 mins, Pain    ASSESSMENT:  CLINICAL IMPRESSION:  Pt arrives for today's treatment session reporting 6/10 right shoulder soreness. Pt reports fishing over the weekend which caused increased pain/soreness.  PROM performed today into flexion and ER with concentration on ER to increase ROM and function.  Pt instructed to continue performing stretching at home daily.  Normal responses to estim and vaso noted upon removal.  Pt reported 4/10 right shoulder pain at completion of today's treatment session.    GOALS:  LONG TERM GOALS: Target date: 08/29/2022  (Remove Blue Hyperlink)  Ind with HEP. Baseline:  Goal status: MET  2.  Active right shoulder flexion to 155 degrees so the patient can easily reach overhead. Baseline: 10/2: 146 degrees; 11/9: 151 degrees Goal status: IN PROGRESS  3.  Active ER to 75 degrees+ to allow for easily donning/doffing of apparel. Baseline: 10/2: 46 degrees; 11/9: 64 degress Goal status: IN PROGRESS  4.  Perform ADL's with pain not > 2-3/10. Baseline: 10/2: depends on the day  and activities; 11/9: depends on the day and activities Goal status: IN PROGRESS    PLAN: PT FREQUENCY: 2x/week  PT DURATION: 6 months  PLANNED INTERVENTIONS: Therapeutic exercises, Therapeutic activity, Neuromuscular re-education, Patient/Family education, Self Care, Electrical stimulation, Cryotherapy, Moist heat, Vasopneumatic device, Ultrasound, and Manual therapy  PLAN FOR NEXT SESSION: Please instruct patient in supine cane stretch to increase ER and countertop stretch to increase flexion.  Begin with gentle range of motion to right shoulder.  Progress to pulleys, wall ladder and UE Ranger.   Kathrynn Ducking, PTA 08/29/2022, 9:15 AM

## 2022-09-01 ENCOUNTER — Ambulatory Visit: Payer: Worker's Compensation

## 2022-09-01 DIAGNOSIS — G8929 Other chronic pain: Secondary | ICD-10-CM

## 2022-09-01 DIAGNOSIS — M25611 Stiffness of right shoulder, not elsewhere classified: Secondary | ICD-10-CM

## 2022-09-01 DIAGNOSIS — M25511 Pain in right shoulder: Secondary | ICD-10-CM | POA: Diagnosis not present

## 2022-09-01 DIAGNOSIS — M6281 Muscle weakness (generalized): Secondary | ICD-10-CM

## 2022-09-01 NOTE — Addendum Note (Signed)
Addended by: [place], [place] W on: 09/01/2022 11:24 AM   Modules accepted: Orders

## 2022-09-01 NOTE — Therapy (Addendum)
OUTPATIENT PHYSICAL THERAPY SHOULDER TREATMENT   Patient Name: Craig Church MRN: [id number] DOB:1966-02-24, 56 y.o., male Today's Date: 09/01/2022   PT End of Session - 09/01/22 0821     Visit Number 18    Number of Visits 18    Date for PT Re-Evaluation 09/05/22    PT Start Time 0815    PT Stop Time 0912    PT Time Calculation (min) 57 min    Activity Tolerance Patient tolerated treatment well    Behavior During Therapy [facility] for tasks assessed/performed              Past Medical History:  Diagnosis Date   Hypertension    Seizure disorder [facility])    Past Surgical History:  Procedure Laterality Date   CHOLECYSTECTOMY     OPEN REDUCTION INTERNAL FIXATION (ORIF) DISTAL PHALANX Right 07/12/2021   Procedure: OPEN REDUCTION INTERNAL FIXATION (ORIF) DISTAL RADIUS;  Surgeon: Cindra Presume, MD;  Location: [place];  Service: Plastics;  Laterality: Right;   Patient Active Problem List   Diagnosis Date Noted   Fall 07/09/2021   Arthritis, multiple joint involvement 09/24/2019   Class 1 obesity due to excess calories with serious comorbidity and body mass index (BMI) of 32.0 to 32.9 in adult 09/24/2019   Essential hypertension 09/24/2019   Mixed hyperlipidemia 09/24/2019   Restless legs syndrome 09/24/2019   Allergic rhinitis 11/25/2013   FHx: heart disease 11/25/2013     REFERRING PROVIDER: Jean Rosenthal MD  REFERRING DIAG: S/p arthroscopy of right shoulder.  THERAPY DIAG:  Chronic right shoulder pain  Stiffness of right shoulder, not elsewhere classified  Muscle weakness (generalized)  Rationale for Evaluation and Treatment Rehabilitation  ONSET DATE: 06/16/22 (surgery date).  SUBJECTIVE:                                                                                                                                                                                      SUBJECTIVE STATEMENT: Pt reports 4/10 right shoulder pain today.  Pt reports fishing Tuesday and  that it is getting easier and causes less soreness.   PERTINENT HISTORY: Thoracic compression fractures and right wrist ORIF due to above stated work-related injury.  PAIN:  Are you having pain? Yes: NPRS scale: 4/10 Pain location: right anterior shoulder  PRECAUTIONS: Other: Per shoulder scope protocol.   PATIENT GOALS:  Use right shoulder without pain.  OBJECTIVE:  TODAY'S TREATMENT:                                11/16  EXERCISE LOG   Exercise Repetitions and Resistance  Comments  UBE x 10 minutes at 90 RPM's   Pulleys X 5 mins   UE Ranger  X 4 minutes.   RW 4 Green t-band x 30 reps   Shoulder extension Green t-band x 30 reps    Blank cell = exercise not performed today   Manual Therapy Passive ROM: right shoulder, PROM into flexion and ER with concentration on ER to increase ROM     Modalities  Date:  Unattended Estim: Shoulder, Pre-Mod 80-150 Hz, 15 mins, Pain and Tone Vaso: Shoulder, 34 degrees, 15 mins, Pain    ASSESSMENT:  CLINICAL IMPRESSION:  Pt arrives for today's treatment session reporting 4/10 right shoulder pain.  Pt able to demonstrate 156 degrees of active left shoulder flexion today, meeting his ROM goal.  Pt has made progress towards his ER goal, demonstrating 65 degrees of active ER.  Pt able to tolerate increased reps with all t-band exercises today with min cues to avoid rotation with ER/IR.  Pt returns to [place] on 12/21 and would benefit from continuation of current POC to address limitation and deficits.  Normal responses to estim and vaso noted upon removal.  Pt reported decreased pain at completion of today's treatment session.   GOALS:  LONG TERM GOALS: Target date: 08/29/2022  (Remove Blue Hyperlink)  Ind with HEP. Baseline:  Goal status: MET  2.  Active right shoulder flexion to 155 degrees so the patient can easily reach overhead. Baseline: 10/2: 146 degrees; 11/9: 151 degrees; 11/16: 156 degrees Goal status: MET  3.  Active ER to 75  degrees+ to allow for easily donning/doffing of apparel. Baseline: 10/2: 46 degrees; 11/9: 64 degrees; 11/16: 65 degrees Goal status: IN PROGRESS  4.  Perform ADL's with pain not > 2-3/10. Baseline: 10/2: depends on the day and activities; 11/9: depends on the day and activities Goal status: IN PROGRESS    PLAN: PT FREQUENCY: 2x/week  PT DURATION: 6 months  PLANNED INTERVENTIONS: Therapeutic exercises, Therapeutic activity, Neuromuscular re-education, Patient/Family education, Self Care, Electrical stimulation, Cryotherapy, Moist heat, Vasopneumatic device, Ultrasound, and Manual therapy  PLAN FOR NEXT SESSION: Please instruct patient in supine cane stretch to increase ER and countertop stretch to increase flexion.  Begin with gentle range of motion to right shoulder.  Progress to pulleys, wall ladder and UE Ranger.   Kathrynn Ducking, PTA 09/01/2022, 11:13 AM

## 2022-09-05 ENCOUNTER — Ambulatory Visit: Payer: Worker's Compensation

## 2022-09-05 DIAGNOSIS — G8929 Other chronic pain: Secondary | ICD-10-CM

## 2022-09-05 DIAGNOSIS — M25611 Stiffness of right shoulder, not elsewhere classified: Secondary | ICD-10-CM

## 2022-09-05 DIAGNOSIS — M6281 Muscle weakness (generalized): Secondary | ICD-10-CM

## 2022-09-05 DIAGNOSIS — M25511 Pain in right shoulder: Secondary | ICD-10-CM | POA: Diagnosis not present

## 2022-09-05 NOTE — Therapy (Signed)
OUTPATIENT PHYSICAL THERAPY SHOULDER TREATMENT   Patient Name: Craig Church MRN: [id number] DOB:[birth year], 56 y.o., male Today's Date: 09/05/2022   PT End of Session - 09/05/22 0818     Visit Number 19    Number of Visits 24    Date for PT Re-Evaluation 10/03/22    PT Start Time 0815    PT Stop Time 0928    PT Time Calculation (min) 73 min    Activity Tolerance Patient tolerated treatment well    Behavior During Therapy [facility] for tasks assessed/performed              Past Medical History:  Diagnosis Date   Hypertension    Seizure disorder [facility])    Past Surgical History:  Procedure Laterality Date   CHOLECYSTECTOMY     OPEN REDUCTION INTERNAL FIXATION (ORIF) DISTAL PHALANX Right 07/12/2021   Procedure: OPEN REDUCTION INTERNAL FIXATION (ORIF) DISTAL RADIUS;  Surgeon: Cindra Presume, MD;  Location: [place];  Service: Plastics;  Laterality: Right;   Patient Active Problem List   Diagnosis Date Noted   Fall 07/09/2021   Arthritis, multiple joint involvement 09/24/2019   Class 1 obesity due to excess calories with serious comorbidity and body mass index (BMI) of 32.0 to 32.9 in adult 09/24/2019   Essential hypertension 09/24/2019   Mixed hyperlipidemia 09/24/2019   Restless legs syndrome 09/24/2019   Allergic rhinitis 11/25/2013   FHx: heart disease 11/25/2013     REFERRING PROVIDER: Jean Rosenthal MD  REFERRING DIAG: S/p arthroscopy of right shoulder.  THERAPY DIAG:  Chronic right shoulder pain  Stiffness of right shoulder, not elsewhere classified  Muscle weakness (generalized)  Rationale for Evaluation and Treatment Rehabilitation  ONSET DATE: 06/16/22 (surgery date).  SUBJECTIVE:                                                                                                                                                                                      SUBJECTIVE STATEMENT: Pt reports increased soreness today, 8/10.  Has done a lot of fishing  lately.   PERTINENT HISTORY: Thoracic compression fractures and right wrist ORIF due to above stated work-related injury.  PAIN:  Are you having pain? Yes: NPRS scale: 8/10 Pain location: right anterior shoulder  PRECAUTIONS: Other: Per shoulder scope protocol.   PATIENT GOALS:  Use right shoulder without pain.  OBJECTIVE:  TODAY'S TREATMENT:                                11/20  EXERCISE LOG   Exercise Repetitions and Resistance Comments  UBE x 10 minutes at 90  RPM's   Pulleys X 7 mins   UE Ranger  X 4 minutes.   RW 4    Shoulder extension     Blank cell = exercise not performed today   Manual Therapy Soft Tissue Mobilization: right shoulder, STW/M to posterior right shoulder and scapular musculature to decrease pain and tone Passive ROM: right shoulder, PROM to right shoulder into flexion and ER, concentration on ER to increase ROM and function     Modalities  Date:  Unattended Estim: Shoulder, Pre-Mod 80-150 Hz, 15 mins, Pain and Tone Vaso: Shoulder, 34 degrees, 15 mins, Pain    ASSESSMENT:  CLINICAL IMPRESSION:  Pt arrives for today's treatment session reporting 8/10 right shoulder pain.  Pt reports increased activity and fishing over the past few weeks which he attributes to his increase in pain.  Due to increase in pain pt opted out of performing t-band exercises.  STW/M performed to posterior right shoulder and scapular musculature to decrease pain and tone.  Normal responses to estim and vaso noted upon removal.  Pt reported 410 right shoulder pain upon completion of today's treatment session.    GOALS:  LONG TERM GOALS: Target date: 08/29/2022  (Remove Blue Hyperlink)  Ind with HEP. Baseline:  Goal status: MET  2.  Active right shoulder flexion to 155 degrees so the patient can easily reach overhead. Baseline: 10/2: 146 degrees; 11/9: 151 degrees; 11/16: 156 degrees Goal status: MET  3.  Active ER to 75 degrees+ to allow for easily donning/doffing of  apparel. Baseline: 10/2: 46 degrees; 11/9: 64 degrees; 11/16: 65 degrees Goal status: IN PROGRESS  4.  Perform ADL's with pain not > 2-3/10. Baseline: 10/2: depends on the day and activities; 11/9: depends on the day and activities Goal status: IN PROGRESS    PLAN: PT FREQUENCY: 2x/week  PT DURATION: 6 months  PLANNED INTERVENTIONS: Therapeutic exercises, Therapeutic activity, Neuromuscular re-education, Patient/Family education, Self Care, Electrical stimulation, Cryotherapy, Moist heat, Vasopneumatic device, Ultrasound, and Manual therapy  PLAN FOR NEXT SESSION: Please instruct patient in supine cane stretch to increase ER and countertop stretch to increase flexion.  Begin with gentle range of motion to right shoulder.  Progress to pulleys, wall ladder and UE Ranger.   Kathrynn Ducking, PTA 09/05/2022, 9:30 AM

## 2022-09-12 ENCOUNTER — Ambulatory Visit: Payer: Worker's Compensation

## 2022-09-12 DIAGNOSIS — G8929 Other chronic pain: Secondary | ICD-10-CM

## 2022-09-12 DIAGNOSIS — M25611 Stiffness of right shoulder, not elsewhere classified: Secondary | ICD-10-CM

## 2022-09-12 DIAGNOSIS — M25511 Pain in right shoulder: Secondary | ICD-10-CM | POA: Diagnosis not present

## 2022-09-12 DIAGNOSIS — M6281 Muscle weakness (generalized): Secondary | ICD-10-CM

## 2022-09-12 NOTE — Therapy (Signed)
OUTPATIENT PHYSICAL THERAPY SHOULDER TREATMENT   Patient Name: Craig Church MRN: [id number] DOB:April 02, 1966, 56 y.o., male Today's Date: 09/12/2022   PT End of Session - 09/12/22 0819     Visit Number 20    Number of Visits 24    Date for PT Re-Evaluation 10/03/22    PT Start Time 0817    PT Stop Time 0916    PT Time Calculation (min) 59 min    Activity Tolerance Patient tolerated treatment well    Behavior During Therapy [facility] for tasks assessed/performed              Past Medical History:  Diagnosis Date   Hypertension    Seizure disorder [facility])    Past Surgical History:  Procedure Laterality Date   CHOLECYSTECTOMY     OPEN REDUCTION INTERNAL FIXATION (ORIF) DISTAL PHALANX Right 07/12/2021   Procedure: OPEN REDUCTION INTERNAL FIXATION (ORIF) DISTAL RADIUS;  Surgeon: Cindra Presume, MD;  Location: [place];  Service: Plastics;  Laterality: Right;   Patient Active Problem List   Diagnosis Date Noted   Fall 07/09/2021   Arthritis, multiple joint involvement 09/24/2019   Class 1 obesity due to excess calories with serious comorbidity and body mass index (BMI) of 32.0 to 32.9 in adult 09/24/2019   Essential hypertension 09/24/2019   Mixed hyperlipidemia 09/24/2019   Restless legs syndrome 09/24/2019   Allergic rhinitis 11/25/2013   FHx: heart disease 11/25/2013     REFERRING PROVIDER: Jean Rosenthal MD  REFERRING DIAG: S/p arthroscopy of right shoulder.  THERAPY DIAG:  Chronic right shoulder pain  Stiffness of right shoulder, not elsewhere classified  Muscle weakness (generalized)  Rationale for Evaluation and Treatment Rehabilitation  ONSET DATE: 06/16/22 (surgery date).  SUBJECTIVE:                                                                                                                                                                                      SUBJECTIVE STATEMENT: Pt reports 2/10 posterior shoulder soreness today.  PERTINENT  HISTORY: Thoracic compression fractures and right wrist ORIF due to above stated work-related injury.  PAIN:  Are you having pain? Yes: NPRS scale: 2/10 Pain location: right anterior shoulder  PRECAUTIONS: Other: Per shoulder scope protocol.   PATIENT GOALS:  Use right shoulder without pain.  OBJECTIVE:  TODAY'S TREATMENT:                                11/27  EXERCISE LOG   Exercise Repetitions and Resistance Comments  UBE x 10 minutes at 90 RPM's   Pulleys X 7 mins  UE Ranger     RW 4 Green t-band x 30 reps   Shoulder extension Green t-band x 30 reps    Blank cell = exercise not performed today   Manual Therapy Soft Tissue Mobilization: right shoulder, STW/M to posterior right shoulder and scapular musculature to decrease pain and tone Passive ROM: right shoulder, PROM to right shoulder into flexion and ER, concentration on ER to increase ROM and function     Modalities  Date:  Unattended Estim: Shoulder, Pre-Mod 80-150 Hz, 15 mins, Pain and Tone Vaso: Shoulder, 34 degrees, 15 mins, Pain    ASSESSMENT:  CLINICAL IMPRESSION:  Pt arrives for today's treatment session reporting 2/10 posterior shoulder pain.  Pt able to demonstrate 81 degrees of right shoulder external rotation today, meeting his LTG.  Pt reports that he raked the yard on Wednesday, to which he said, "I paid for it the next day." Normal responses to estim and vaso note dupon removal.  Pt denied any pain at completion of today's treatment session.    GOALS:  LONG TERM GOALS: Target date: 08/29/2022  (Remove Blue Hyperlink)  Ind with HEP. Baseline:  Goal status: MET  2.  Active right shoulder flexion to 155 degrees so the patient can easily reach overhead. Baseline: 10/2: 146 degrees; 11/9: 151 degrees; 11/16: 156 degrees Goal status: MET  3.  Active ER to 75 degrees+ to allow for easily donning/doffing of apparel. Baseline: 10/2: 46 degrees; 11/9: 64 degrees; 11/16: 65 degrees; 11/27: 81  degrees Goal status: MET  4.  Perform ADL's with pain not > 2-3/10. Baseline: 10/2: depends on the day and activities; 11/9: depends on the day and activities Goal status: IN PROGRESS    PLAN: PT FREQUENCY: 2x/week  PT DURATION: 6 months  PLANNED INTERVENTIONS: Therapeutic exercises, Therapeutic activity, Neuromuscular re-education, Patient/Family education, Self Care, Electrical stimulation, Cryotherapy, Moist heat, Vasopneumatic device, Ultrasound, and Manual therapy  PLAN FOR NEXT SESSION: Please instruct patient in supine cane stretch to increase ER and countertop stretch to increase flexion.  Begin with gentle range of motion to right shoulder.  Progress to pulleys, wall ladder and UE Ranger.   Kathrynn Ducking, PTA 09/12/2022, 9:16 AM

## 2022-09-15 ENCOUNTER — Ambulatory Visit: Payer: Worker's Compensation

## 2022-09-15 DIAGNOSIS — M25611 Stiffness of right shoulder, not elsewhere classified: Secondary | ICD-10-CM

## 2022-09-15 DIAGNOSIS — G8929 Other chronic pain: Secondary | ICD-10-CM

## 2022-09-15 DIAGNOSIS — M25511 Pain in right shoulder: Secondary | ICD-10-CM | POA: Diagnosis not present

## 2022-09-15 NOTE — Therapy (Signed)
OUTPATIENT PHYSICAL THERAPY SHOULDER TREATMENT   Patient Name: Craig Church MRN: [id number] DOB:1966-03-26, 56 y.o., male Today's Date: 09/15/2022   PT End of Session - 09/15/22 0823     Visit Number 21    Number of Visits 24    Date for PT Re-Evaluation 10/03/22    PT Start Time 0821    PT Stop Time 0917    PT Time Calculation (min) 56 min    Activity Tolerance Patient tolerated treatment well    Behavior During Therapy [facility] for tasks assessed/performed              Past Medical History:  Diagnosis Date   Hypertension    Seizure disorder [facility])    Past Surgical History:  Procedure Laterality Date   CHOLECYSTECTOMY     OPEN REDUCTION INTERNAL FIXATION (ORIF) DISTAL PHALANX Right 07/12/2021   Procedure: OPEN REDUCTION INTERNAL FIXATION (ORIF) DISTAL RADIUS;  Surgeon: Cindra Presume, MD;  Location: [place];  Service: Plastics;  Laterality: Right;   Patient Active Problem List   Diagnosis Date Noted   Fall 07/09/2021   Arthritis, multiple joint involvement 09/24/2019   Class 1 obesity due to excess calories with serious comorbidity and body mass index (BMI) of 32.0 to 32.9 in adult 09/24/2019   Essential hypertension 09/24/2019   Mixed hyperlipidemia 09/24/2019   Restless legs syndrome 09/24/2019   Allergic rhinitis 11/25/2013   FHx: heart disease 11/25/2013     REFERRING PROVIDER: Jean Rosenthal MD  REFERRING DIAG: S/p arthroscopy of right shoulder.  THERAPY DIAG:  Chronic right shoulder pain  Stiffness of right shoulder, not elsewhere classified  Rationale for Evaluation and Treatment Rehabilitation  ONSET DATE: 06/16/22 (surgery date).  SUBJECTIVE:                                                                                                                                                                                      SUBJECTIVE STATEMENT: Pt reports 2/10 posterior shoulder soreness today.  PERTINENT HISTORY: Thoracic compression fractures  and right wrist ORIF due to above stated work-related injury.  PAIN:  Are you having pain? Yes: NPRS scale: 2/10 Pain location: right anterior shoulder  PRECAUTIONS: Other: Per shoulder scope protocol.   PATIENT GOALS:  Use right shoulder without pain.  OBJECTIVE:  TODAY'S TREATMENT:                                11/30  EXERCISE LOG   Exercise Repetitions and Resistance Comments  UBE x 10 minutes at 90 RPM's   Pulleys X 5 mins   Flexion 2# x  20 reps   Abduction 2# x 3 reps; stopped due to pain   Scaption 2# x 20 reps   RW 4 Green t-band x 30 reps   Shoulder extension Green t-band x 30 reps    Blank cell = exercise not performed today   Manual Therapy Joint Mobilizations: Right Scapula, scapular mobs in all directions with concentration on upward rotation     Modalities  Date:  Unattended Estim: Shoulder, Pre-Mod 80-150 Hz, 15 mins, Pain and Tone    ASSESSMENT:  CLINICAL IMPRESSION:  Pt arrives for today's treatment session reporting 2/10 posterior shoulder pain.  Pt introduced to standing weighted flexion, scaption, and abduction today.  Pt unable to perform abduction due to increase in posterior shoulder pain.  Scapular mobs performed for right scapula in all directions with concentration on upward rotation.  Pt reported decreased pain post scapular mobs.  Normal responses to estm noted upon removal.  Pt denied any pain at completion of today's treatment session.    GOALS:  LONG TERM GOALS: Target date: 08/29/2022  (Remove Blue Hyperlink)  Ind with HEP. Baseline:  Goal status: MET  2.  Active right shoulder flexion to 155 degrees so the patient can easily reach overhead. Baseline: 10/2: 146 degrees; 11/9: 151 degrees; 11/16: 156 degrees Goal status: MET  3.  Active ER to 75 degrees+ to allow for easily donning/doffing of apparel. Baseline: 10/2: 46 degrees; 11/9: 64 degrees; 11/16: 65 degrees; 11/27: 81 degrees Goal status: MET  4.  Perform ADL's with pain  not > 2-3/10. Baseline: 10/2: depends on the day and activities; 11/9: depends on the day and activities Goal status: IN PROGRESS    PLAN: PT FREQUENCY: 2x/week  PT DURATION: 6 months  PLANNED INTERVENTIONS: Therapeutic exercises, Therapeutic activity, Neuromuscular re-education, Patient/Family education, Self Care, Electrical stimulation, Cryotherapy, Moist heat, Vasopneumatic device, Ultrasound, and Manual therapy  PLAN FOR NEXT SESSION: Please instruct patient in supine cane stretch to increase ER and countertop stretch to increase flexion.  Begin with gentle range of motion to right shoulder.  Progress to pulleys, wall ladder and UE Ranger.   Kathrynn Ducking, PTA 09/15/2022, 9:39 AM

## 2022-09-19 ENCOUNTER — Ambulatory Visit: Payer: Worker's Compensation | Attending: Orthopaedic Surgery

## 2022-09-19 DIAGNOSIS — G8929 Other chronic pain: Secondary | ICD-10-CM | POA: Diagnosis present

## 2022-09-19 DIAGNOSIS — M25611 Stiffness of right shoulder, not elsewhere classified: Secondary | ICD-10-CM | POA: Insufficient documentation

## 2022-09-19 DIAGNOSIS — M25511 Pain in right shoulder: Secondary | ICD-10-CM | POA: Diagnosis present

## 2022-09-19 DIAGNOSIS — M6281 Muscle weakness (generalized): Secondary | ICD-10-CM | POA: Diagnosis present

## 2022-09-19 NOTE — Therapy (Signed)
OUTPATIENT PHYSICAL THERAPY SHOULDER TREATMENT   Patient Name: Craig Church MRN: [id number] DOB:1966/10/07, 56 y.o., male Today's Date: 09/19/2022   PT End of Session - 09/19/22 0831     Visit Number 23    Number of Visits 24    Date for PT Re-Evaluation 10/03/22    PT Start Time 0820    PT Stop Time 0921    PT Time Calculation (min) 61 min    Activity Tolerance Patient tolerated treatment well    Behavior During Therapy [facility] for tasks assessed/performed              Past Medical History:  Diagnosis Date   Hypertension    Seizure disorder [facility])    Past Surgical History:  Procedure Laterality Date   CHOLECYSTECTOMY     OPEN REDUCTION INTERNAL FIXATION (ORIF) DISTAL PHALANX Right 07/12/2021   Procedure: OPEN REDUCTION INTERNAL FIXATION (ORIF) DISTAL RADIUS;  Surgeon: Cindra Presume, MD;  Location: [place];  Service: Plastics;  Laterality: Right;   Patient Active Problem List   Diagnosis Date Noted   Fall 07/09/2021   Arthritis, multiple joint involvement 09/24/2019   Class 1 obesity due to excess calories with serious comorbidity and body mass index (BMI) of 32.0 to 32.9 in adult 09/24/2019   Essential hypertension 09/24/2019   Mixed hyperlipidemia 09/24/2019   Restless legs syndrome 09/24/2019   Allergic rhinitis 11/25/2013   FHx: heart disease 11/25/2013     REFERRING PROVIDER: Jean Rosenthal MD  REFERRING DIAG: S/p arthroscopy of right shoulder.  THERAPY DIAG:  Chronic right shoulder pain  Stiffness of right shoulder, not elsewhere classified  Rationale for Evaluation and Treatment Rehabilitation  ONSET DATE: 06/16/22 (surgery date).  SUBJECTIVE:                                                                                                                                                                                      SUBJECTIVE STATEMENT: Pt reports 3/10 posterior shoulder soreness today.  PERTINENT HISTORY: Thoracic compression fractures and  right wrist ORIF due to above stated work-related injury.  PAIN:  Are you having pain? Yes: NPRS scale: 3/10 Pain location: right anterior shoulder  PRECAUTIONS: Other: Per shoulder scope protocol.   PATIENT GOALS:  Use right shoulder without pain.  OBJECTIVE:  TODAY'S TREATMENT:                                12/4  EXERCISE LOG   Exercise Repetitions and Resistance Comments  UBE x 10 minutes at 90 RPM's   Pulleys X 5 mins   Flexion 2# x  20 reps   Abduction    Scaption 2# x 4 reps; discontinued due to pain.   RW 4 Green t-band x 30 reps   Shoulder extension Blue t-band x 30 reps Concentration on scap stability   Blank cell = exercise not performed today   Manual Therapy Joint Mobilizations: Right Scapula, scapular mobs in all directions with concentration on upward rotation     Modalities  Date:  Unattended Estim: Shoulder, IFC 80-150 Hz, 15 mins, Pain and Tone Vaso: Shoulder, 34 degrees, 15 mins, Pain and Tone    ASSESSMENT:  CLINICAL IMPRESSION:  Pt arrives for today's treatment session reporting 3/10 right posterior shoulder pain.  Pt reports fishing over the weekend and that his pain has increased since then.  Pt unable to perform weighted should scaption and abduction due to increased pain today. STW/M performed to posterior right should musculature to decrease pain and tone.  Scapular mobs also performed to right scapula to promote ROM and function.  Normal responses to estim and vaso noted upon removal.  Pt reported 1/10 right shoulder pain at completion of today's treatment session.   GOALS:  LONG TERM GOALS: Target date: 08/29/2022  (Remove Blue Hyperlink)  Ind with HEP. Baseline:  Goal status: MET  2.  Active right shoulder flexion to 155 degrees so the patient can easily reach overhead. Baseline: 10/2: 146 degrees; 11/9: 151 degrees; 11/16: 156 degrees Goal status: MET  3.  Active ER to 75 degrees+ to allow for easily donning/doffing of  apparel. Baseline: 10/2: 46 degrees; 11/9: 64 degrees; 11/16: 65 degrees; 11/27: 81 degrees Goal status: MET  4.  Perform ADL's with pain not > 2-3/10. Baseline: 10/2: depends on the day and activities; 11/9: depends on the day and activities Goal status: IN PROGRESS    PLAN: PT FREQUENCY: 2x/week  PT DURATION: 6 months  PLANNED INTERVENTIONS: Therapeutic exercises, Therapeutic activity, Neuromuscular re-education, Patient/Family education, Self Care, Electrical stimulation, Cryotherapy, Moist heat, Vasopneumatic device, Ultrasound, and Manual therapy  PLAN FOR NEXT SESSION: Please instruct patient in supine cane stretch to increase ER and countertop stretch to increase flexion.  Begin with gentle range of motion to right shoulder.  Progress to pulleys, wall ladder and UE Ranger.   Kathrynn Ducking, PTA 09/19/2022, 9:44 AM

## 2022-09-26 ENCOUNTER — Ambulatory Visit: Payer: Worker's Compensation

## 2022-09-26 DIAGNOSIS — G8929 Other chronic pain: Secondary | ICD-10-CM

## 2022-09-26 DIAGNOSIS — M25511 Pain in right shoulder: Secondary | ICD-10-CM | POA: Diagnosis not present

## 2022-09-26 DIAGNOSIS — M6281 Muscle weakness (generalized): Secondary | ICD-10-CM

## 2022-09-26 DIAGNOSIS — M25611 Stiffness of right shoulder, not elsewhere classified: Secondary | ICD-10-CM

## 2022-09-26 NOTE — Therapy (Signed)
OUTPATIENT PHYSICAL THERAPY SHOULDER TREATMENT   Patient Name: Craig Church MRN: [id number] DOB:Feb 17, 1966, 56 y.o., male Today's Date: 09/26/2022   PT End of Session - 09/26/22 0821     Visit Number 23    Number of Visits 24    Date for PT Re-Evaluation 10/03/22    PT Start Time 0818    PT Stop Time 0917    PT Time Calculation (min) 59 min    Activity Tolerance Patient tolerated treatment well    Behavior During Therapy [facility] for tasks assessed/performed              Past Medical History:  Diagnosis Date   Hypertension    Seizure disorder [facility])    Past Surgical History:  Procedure Laterality Date   CHOLECYSTECTOMY     OPEN REDUCTION INTERNAL FIXATION (ORIF) DISTAL PHALANX Right 07/12/2021   Procedure: OPEN REDUCTION INTERNAL FIXATION (ORIF) DISTAL RADIUS;  Surgeon: Cindra Presume, MD;  Location: [place];  Service: Plastics;  Laterality: Right;   Patient Active Problem List   Diagnosis Date Noted   Fall 07/09/2021   Arthritis, multiple joint involvement 09/24/2019   Class 1 obesity due to excess calories with serious comorbidity and body mass index (BMI) of 32.0 to 32.9 in adult 09/24/2019   Essential hypertension 09/24/2019   Mixed hyperlipidemia 09/24/2019   Restless legs syndrome 09/24/2019   Allergic rhinitis 11/25/2013   FHx: heart disease 11/25/2013     REFERRING PROVIDER: Jean Rosenthal MD  REFERRING DIAG: S/p arthroscopy of right shoulder.  THERAPY DIAG:  Chronic right shoulder pain  Muscle weakness (generalized)  Stiffness of right shoulder, not elsewhere classified  Rationale for Evaluation and Treatment Rehabilitation  ONSET DATE: 06/16/22 (surgery date).  SUBJECTIVE:                                                                                                                                                                                      SUBJECTIVE STATEMENT: Pt reports 2/10 right shoulder soreness today.  PERTINENT  HISTORY: Thoracic compression fractures and right wrist ORIF due to above stated work-related injury.  PAIN:  Are you having pain? Yes: NPRS scale: 2/10 Pain location: right anterior shoulder  PRECAUTIONS: Other: Per shoulder scope protocol.   PATIENT GOALS:  Use right shoulder without pain.  OBJECTIVE:  TODAY'S TREATMENT:                                12/11  EXERCISE LOG   Exercise Repetitions and Resistance Comments  UBE x 10 minutes at 90 RPM's   Pulleys X 5 mins  Flexion 2# x 20 reps   Abduction Unable to perform   Scaption 2# x 20 reps   RW 4 Green t-band x 30 reps   Shoulder extension Blue t-band x 30 reps Concentration on scap stability   Blank cell = exercise not performed today   Manual Therapy Soft Tissue Mobilization: right shoulder, STW/M to right upper trap and upper deltoid to decrease pain and tone     Modalities  Date:  Unattended Estim: Shoulder, IFC 80-150 Hz, 15 mins, Pain and Tone Vaso: Shoulder, 34 degrees, 15 mins, Pain and Tone    ASSESSMENT:  CLINICAL IMPRESSION:  Pt arrives for today's treatment session reporting 2/10 right shoulder pain.  Pt requiring min cues for proper technique and to avoid turning body with ER and IR.  STW/M performed to right upper trap and right deltoid to decrease pain and tone.  Normal responses to estim and vaso noted upon removal.  Pt denied any pain at completion of today's treatment session.  Pt ready for discharge at next session.    GOALS:  LONG TERM GOALS: Target date: 08/29/2022  (Remove Blue Hyperlink)  Ind with HEP. Baseline:  Goal status: MET  2.  Active right shoulder flexion to 155 degrees so the patient can easily reach overhead. Baseline: 10/2: 146 degrees; 11/9: 151 degrees; 11/16: 156 degrees Goal status: MET  3.  Active ER to 75 degrees+ to allow for easily donning/doffing of apparel. Baseline: 10/2: 46 degrees; 11/9: 64 degrees; 11/16: 65 degrees; 11/27: 81 degrees Goal status: MET  4.   Perform ADL's with pain not > 2-3/10. Baseline: 10/2: depends on the day and activities; 11/9: depends on the day and activities Goal status: IN PROGRESS    PLAN: PT FREQUENCY: 2x/week  PT DURATION: 6 months  PLANNED INTERVENTIONS: Therapeutic exercises, Therapeutic activity, Neuromuscular re-education, Patient/Family education, Self Care, Electrical stimulation, Cryotherapy, Moist heat, Vasopneumatic device, Ultrasound, and Manual therapy  PLAN FOR NEXT SESSION: Please instruct patient in supine cane stretch to increase ER and countertop stretch to increase flexion.  Begin with gentle range of motion to right shoulder.  Progress to pulleys, wall ladder and UE Ranger.   Kathrynn Ducking, PTA 09/26/2022, 9:31 AM

## 2022-10-03 ENCOUNTER — Ambulatory Visit: Payer: Worker's Compensation

## 2022-10-03 DIAGNOSIS — M6281 Muscle weakness (generalized): Secondary | ICD-10-CM

## 2022-10-03 DIAGNOSIS — M25611 Stiffness of right shoulder, not elsewhere classified: Secondary | ICD-10-CM

## 2022-10-03 DIAGNOSIS — G8929 Other chronic pain: Secondary | ICD-10-CM

## 2022-10-03 DIAGNOSIS — M25511 Pain in right shoulder: Secondary | ICD-10-CM | POA: Diagnosis not present

## 2022-10-03 NOTE — Therapy (Signed)
OUTPATIENT PHYSICAL THERAPY SHOULDER TREATMENT   Patient Name: Craig Church MRN: [id number] DOB:1965-10-21, 56 y.o., male Today's Date: 10/03/2022   PT End of Session - 10/03/22 0820     Visit Number 24    Number of Visits 24    Date for PT Re-Evaluation 10/03/22    PT Start Time 0820    PT Stop Time 0914    PT Time Calculation (min) 54 min    Activity Tolerance Patient tolerated treatment well    Behavior During Therapy [facility] for tasks assessed/performed              Past Medical History:  Diagnosis Date   Hypertension    Seizure disorder [facility])    Past Surgical History:  Procedure Laterality Date   CHOLECYSTECTOMY     OPEN REDUCTION INTERNAL FIXATION (ORIF) DISTAL PHALANX Right 07/12/2021   Procedure: OPEN REDUCTION INTERNAL FIXATION (ORIF) DISTAL RADIUS;  Surgeon: Cindra Presume, MD;  Location: [place];  Service: Plastics;  Laterality: Right;   Patient Active Problem List   Diagnosis Date Noted   Fall 07/09/2021   Arthritis, multiple joint involvement 09/24/2019   Class 1 obesity due to excess calories with serious comorbidity and body mass index (BMI) of 32.0 to 32.9 in adult 09/24/2019   Essential hypertension 09/24/2019   Mixed hyperlipidemia 09/24/2019   Restless legs syndrome 09/24/2019   Allergic rhinitis 11/25/2013   FHx: heart disease 11/25/2013     REFERRING PROVIDER: Jean Rosenthal MD  REFERRING DIAG: S/p arthroscopy of right shoulder.  THERAPY DIAG:  Chronic right shoulder pain  Stiffness of right shoulder, not elsewhere classified  Muscle weakness (generalized)  Rationale for Evaluation and Treatment Rehabilitation  ONSET DATE: 06/16/22 (surgery date).  SUBJECTIVE:                                                                                                                                                                                      SUBJECTIVE STATEMENT: Pt reports feeling good today with 2/10 right shoulder pain.  Pt ready for  discharge today.   PERTINENT HISTORY: Thoracic compression fractures and right wrist ORIF due to above stated work-related injury.  PAIN:  Are you having pain? Yes: NPRS scale: 2/10 Pain location: right anterior shoulder  PRECAUTIONS: Other: Per shoulder scope protocol.   PATIENT GOALS:  Use right shoulder without pain.  OBJECTIVE:  TODAY'S TREATMENT:                                12/18  EXERCISE LOG   Exercise Repetitions and Resistance Comments  UBE x 10 minutes  at 90 RPM's   Pulleys X 5 mins   Flexion 2# x 25 reps   Abduction Unable to perform   Scaption 2# x 25 reps   RW 4 Green t-band x 30 reps   Shoulder extension Blue t-band x 30 reps Concentration on scap stability   Blank cell = exercise not performed today   Manual Therapy Soft Tissue Mobilization: right shoulder, STW/M to right upper trap and upper deltoid to decrease pain and tone     Modalities  Date:  Unattended Estim: Shoulder, IFC 80-150 Hz, 15 mins, Pain and Tone Vaso: Shoulder, 34 degrees, 15 mins, Pain and Tone    ASSESSMENT:  CLINICAL IMPRESSION:  Pt arrives for today's treatment session reporting 2/10 right shoulder pain.  Pt reports that shoulder feels 95% better since beginning surgery, but continues to be limited by pain on busy days.  Pt has meet all of his goals at this time except his pain goal.  Pt encouraged to call the facility with any questions or concerns.  Pt instructed to continue performing HEP at home regularly.  Pt ready for discharge at this time.   GOALS:  LONG TERM GOALS: Target date: 08/29/2022  (Remove Blue Hyperlink)  Ind with HEP. Baseline:  Goal status: MET  2.  Active right shoulder flexion to 155 degrees so the patient can easily reach overhead. Baseline: 10/2: 146 degrees; 11/9: 151 degrees; 11/16: 156 degrees Goal status: MET  3.  Active ER to 75 degrees+ to allow for easily donning/doffing of apparel. Baseline: 10/2: 46 degrees; 11/9: 64 degrees; 11/16: 65  degrees; 11/27: 81 degrees Goal status: MET  4.  Perform ADL's with pain not > 2-3/10. Baseline: 10/2: depends on the day and activities; 11/9: depends on the day and activities; 12/18: depends on the day and activities Goal status: IN PROGRESS    PLAN: PT FREQUENCY: 2x/week  PT DURATION: 6 months  PLANNED INTERVENTIONS: Therapeutic exercises, Therapeutic activity, Neuromuscular re-education, Patient/Family education, Self Care, Electrical stimulation, Cryotherapy, Moist heat, Vasopneumatic device, Ultrasound, and Manual therapy  PLAN FOR NEXT SESSION: Please instruct patient in supine cane stretch to increase ER and countertop stretch to increase flexion.  Begin with gentle range of motion to right shoulder.  Progress to pulleys, wall ladder and UE Ranger.   Kathrynn Ducking, PTA 10/03/2022, 9:15 AM

## 2022-10-06 ENCOUNTER — Encounter: Payer: Self-pay | Admitting: Physician Assistant

## 2022-10-06 ENCOUNTER — Ambulatory Visit (INDEPENDENT_AMBULATORY_CARE_PROVIDER_SITE_OTHER): Payer: Worker's Compensation | Admitting: Physician Assistant

## 2022-10-06 DIAGNOSIS — Z9889 Other specified postprocedural states: Secondary | ICD-10-CM

## 2022-10-06 NOTE — Progress Notes (Signed)
HPI: Craig Church  returns today for his right shoulder.  He underwent a right shoulder arthroscopy with debridement NSAID on 06/16/2022.  He states overall he is doing well having no pain in the shoulder.  He is finished up with physical therapy.  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  Again this was a work-related injury.  Physical exam: General well-developed well-nourished male no acute distress. Psych: Alert and oriented x 3 Bilateral shoulders: Full overhead active motion.  5 out of 5 strength bilaterally with external and internal rotation against resistance.  Empty can test is negative bilaterally.  Impingement testing negative bilaterally.  He has slightly greater external rotation of his right shoulder compared to his left today.  Liftoff test is negative bilaterally.   Impression: Status post right shoulder arthroscopy with debridement and SA D 06/16/2022.    Plan: Given the fact that he is at [facility] we will release him.  Dr. Magnus Ivan will be rate release on him in the near future.  Questions were encouraged and answered.  Follow-up as needed

## 2022-10-25 NOTE — Therapy (Signed)
OUTPATIENT PHYSICAL THERAPY SHOULDER TREATMENT   Patient Name: Craig Church MRN: [id number] DOB:Feb 17, 1966, 57 y.o., male Today's Date: 09/26/2022   PT End of Session - 09/26/22 0821     Visit Number 23    Number of Visits 24    Date for PT Re-Evaluation 10/03/22    PT Start Time 0818    PT Stop Time 0917    PT Time Calculation (min) 59 min    Activity Tolerance Patient tolerated treatment well    Behavior During Therapy [facility] for tasks assessed/performed              Past Medical History:  Diagnosis Date   Hypertension    Seizure disorder [facility])    Past Surgical History:  Procedure Laterality Date   CHOLECYSTECTOMY     OPEN REDUCTION INTERNAL FIXATION (ORIF) DISTAL PHALANX Right 07/12/2021   Procedure: OPEN REDUCTION INTERNAL FIXATION (ORIF) DISTAL RADIUS;  Surgeon: Cindra Presume, MD;  Location: [place];  Service: Plastics;  Laterality: Right;   Patient Active Problem List   Diagnosis Date Noted   Fall 07/09/2021   Arthritis, multiple joint involvement 09/24/2019   Class 1 obesity due to excess calories with serious comorbidity and body mass index (BMI) of 32.0 to 32.9 in adult 09/24/2019   Essential hypertension 09/24/2019   Mixed hyperlipidemia 09/24/2019   Restless legs syndrome 09/24/2019   Allergic rhinitis 11/25/2013   FHx: heart disease 11/25/2013     REFERRING PROVIDER: Jean Rosenthal MD  REFERRING DIAG: S/p arthroscopy of right shoulder.  THERAPY DIAG:  Chronic right shoulder pain  Muscle weakness (generalized)  Stiffness of right shoulder, not elsewhere classified  Rationale for Evaluation and Treatment Rehabilitation  ONSET DATE: 06/16/22 (surgery date).  SUBJECTIVE:                                                                                                                                                                                      SUBJECTIVE STATEMENT: Pt reports 2/10 right shoulder soreness today.  PERTINENT  HISTORY: Thoracic compression fractures and right wrist ORIF due to above stated work-related injury.  PAIN:  Are you having pain? Yes: NPRS scale: 2/10 Pain location: right anterior shoulder  PRECAUTIONS: Other: Per shoulder scope protocol.   PATIENT GOALS:  Use right shoulder without pain.  OBJECTIVE:  TODAY'S TREATMENT:                                12/11  EXERCISE LOG   Exercise Repetitions and Resistance Comments  UBE x 10 minutes at 90 RPM's   Pulleys X 5 mins  Flexion 2# x 20 reps   Abduction Unable to perform   Scaption 2# x 20 reps   RW 4 Green t-band x 30 reps   Shoulder extension Blue t-band x 30 reps Concentration on scap stability   Blank cell = exercise not performed today   Manual Therapy Soft Tissue Mobilization: right shoulder, STW/M to right upper trap and upper deltoid to decrease pain and tone     Modalities  Date:  Unattended Estim: Shoulder, IFC 80-150 Hz, 15 mins, Pain and Tone Vaso: Shoulder, 34 degrees, 15 mins, Pain and Tone    ASSESSMENT:  CLINICAL IMPRESSION:  Pt arrives for today's treatment session reporting 2/10 right shoulder pain.  Pt requiring min cues for proper technique and to avoid turning body with ER and IR.  STW/M performed to right upper trap and right deltoid to decrease pain and tone.  Normal responses to estim and vaso noted upon removal.  Pt denied any pain at completion of today's treatment session.  Pt ready for discharge at next session.    GOALS:  LONG TERM GOALS: Target date: 08/29/2022  (Remove Blue Hyperlink)  Ind with HEP. Baseline:  Goal status: MET  2.  Active right shoulder flexion to 155 degrees so the patient can easily reach overhead. Baseline: 10/2: 146 degrees; 11/9: 151 degrees; 11/16: 156 degrees Goal status: MET  3.  Active ER to 75 degrees+ to allow for easily donning/doffing of apparel. Baseline: 10/2: 46 degrees; 11/9: 64 degrees; 11/16: 65 degrees; 11/27: 81 degrees Goal status: MET  4.   Perform ADL's with pain not > 2-3/10. Baseline: 10/2: depends on the day and activities; 11/9: depends on the day and activities Goal status: IN PROGRESS    PLAN: PT FREQUENCY: 2x/week  PT DURATION: 6 months  PLANNED INTERVENTIONS: Therapeutic exercises, Therapeutic activity, Neuromuscular re-education, Patient/Family education, Self Care, Electrical stimulation, Cryotherapy, Moist heat, Vasopneumatic device, Ultrasound, and Manual therapy  PLAN FOR NEXT SESSION: Please instruct patient in supine cane stretch to increase ER and countertop stretch to increase flexion.  Begin with gentle range of motion to right shoulder.  Progress to pulleys, wall ladder and UE Ranger.   Kathrynn Ducking, PTA 09/26/2022, 9:31 AM    PHYSICAL THERAPY DISCHARGE SUMMARY  Visits from Start of Care: 23  Current functional level related to goals / functional outcomes: See above.   Remaining deficits: See goal section.   Education / Equipment: HEP.   Patient agrees to discharge. Patient goals were partially met. Patient is being discharged due to  completing course of PT.     Mali Jenilee Franey MPT

## 2022-11-17 ENCOUNTER — Ambulatory Visit: Payer: Self-pay | Admitting: Orthopaedic Surgery

## 2022-12-22 ENCOUNTER — Encounter: Payer: Self-pay | Admitting: Radiology

## 2023-03-10 DIAGNOSIS — Z809 Family history of malignant neoplasm, unspecified: Secondary | ICD-10-CM | POA: Diagnosis not present

## 2023-03-10 DIAGNOSIS — Z880 Allergy status to penicillin: Secondary | ICD-10-CM | POA: Diagnosis not present

## 2023-03-10 DIAGNOSIS — G2581 Restless legs syndrome: Secondary | ICD-10-CM | POA: Diagnosis not present

## 2023-03-10 DIAGNOSIS — M1990 Unspecified osteoarthritis, unspecified site: Secondary | ICD-10-CM | POA: Diagnosis not present

## 2023-03-10 DIAGNOSIS — E785 Hyperlipidemia, unspecified: Secondary | ICD-10-CM | POA: Diagnosis not present

## 2023-03-10 DIAGNOSIS — G43909 Migraine, unspecified, not intractable, without status migrainosus: Secondary | ICD-10-CM | POA: Diagnosis not present

## 2023-03-10 DIAGNOSIS — Z7982 Long term (current) use of aspirin: Secondary | ICD-10-CM | POA: Diagnosis not present

## 2023-03-10 DIAGNOSIS — Z8249 Family history of ischemic heart disease and other diseases of the circulatory system: Secondary | ICD-10-CM | POA: Diagnosis not present

## 2023-03-10 DIAGNOSIS — G40909 Epilepsy, unspecified, not intractable, without status epilepticus: Secondary | ICD-10-CM | POA: Diagnosis not present

## 2023-03-10 DIAGNOSIS — K219 Gastro-esophageal reflux disease without esophagitis: Secondary | ICD-10-CM | POA: Diagnosis not present

## 2023-03-10 DIAGNOSIS — I10 Essential (primary) hypertension: Secondary | ICD-10-CM | POA: Diagnosis not present

## 2023-03-10 DIAGNOSIS — G4730 Sleep apnea, unspecified: Secondary | ICD-10-CM | POA: Diagnosis not present

## 2023-03-21 DIAGNOSIS — Z0001 Encounter for general adult medical examination with abnormal findings: Secondary | ICD-10-CM | POA: Diagnosis not present

## 2023-03-21 DIAGNOSIS — Z23 Encounter for immunization: Secondary | ICD-10-CM | POA: Diagnosis not present

## 2023-03-21 DIAGNOSIS — Z13220 Encounter for screening for lipoid disorders: Secondary | ICD-10-CM | POA: Diagnosis not present

## 2023-03-21 DIAGNOSIS — E538 Deficiency of other specified B group vitamins: Secondary | ICD-10-CM | POA: Diagnosis not present

## 2023-03-21 DIAGNOSIS — R197 Diarrhea, unspecified: Secondary | ICD-10-CM | POA: Diagnosis not present

## 2023-03-21 DIAGNOSIS — R739 Hyperglycemia, unspecified: Secondary | ICD-10-CM | POA: Diagnosis not present

## 2023-03-21 DIAGNOSIS — E559 Vitamin D deficiency, unspecified: Secondary | ICD-10-CM | POA: Diagnosis not present

## 2023-03-21 DIAGNOSIS — Z136 Encounter for screening for cardiovascular disorders: Secondary | ICD-10-CM | POA: Diagnosis not present

## 2023-03-29 DIAGNOSIS — E538 Deficiency of other specified B group vitamins: Secondary | ICD-10-CM | POA: Diagnosis not present

## 2023-04-05 DIAGNOSIS — E538 Deficiency of other specified B group vitamins: Secondary | ICD-10-CM | POA: Diagnosis not present

## 2023-04-12 DIAGNOSIS — E538 Deficiency of other specified B group vitamins: Secondary | ICD-10-CM | POA: Diagnosis not present

## 2023-04-19 DIAGNOSIS — E538 Deficiency of other specified B group vitamins: Secondary | ICD-10-CM | POA: Diagnosis not present

## 2023-04-19 DIAGNOSIS — R197 Diarrhea, unspecified: Secondary | ICD-10-CM | POA: Diagnosis not present

## 2023-05-07 ENCOUNTER — Emergency Department (HOSPITAL_BASED_OUTPATIENT_CLINIC_OR_DEPARTMENT_OTHER)
Admission: EM | Admit: 2023-05-07 | Discharge: 2023-05-07 | Discharge status: Against Medical Advice | Payer: 59 | Attending: Emergency Medicine | Admitting: Emergency Medicine

## 2023-05-07 ENCOUNTER — Other Ambulatory Visit: Payer: Self-pay

## 2023-05-07 ENCOUNTER — Encounter (HOSPITAL_BASED_OUTPATIENT_CLINIC_OR_DEPARTMENT_OTHER): Payer: Self-pay

## 2023-05-07 DIAGNOSIS — R1010 Upper abdominal pain, unspecified: Secondary | ICD-10-CM | POA: Diagnosis not present

## 2023-05-07 DIAGNOSIS — Z5321 Procedure and treatment not carried out due to patient leaving prior to being seen by health care provider: Secondary | ICD-10-CM | POA: Diagnosis not present

## 2023-05-07 DIAGNOSIS — R109 Unspecified abdominal pain: Secondary | ICD-10-CM | POA: Diagnosis not present

## 2023-05-07 LAB — CBC
HCT: 41.2 % (ref 39.0–52.0)
Hemoglobin: 13.3 g/dL (ref 13.0–17.0)
MCH: 25.9 pg — ABNORMAL LOW (ref 26.0–34.0)
MCHC: 32.3 g/dL (ref 30.0–36.0)
MCV: 80.2 fL (ref 80.0–100.0)
Platelets: 374 10*3/uL (ref 150–400)
RBC: 5.14 MIL/uL (ref 4.22–5.81)
RDW: 13.8 % (ref 11.5–15.5)
WBC: 7.9 10*3/uL (ref 4.0–10.5)
nRBC: 0 % (ref 0.0–0.2)

## 2023-05-07 LAB — URINALYSIS, ROUTINE W REFLEX MICROSCOPIC
Bacteria, UA: NONE SEEN
Bilirubin Urine: NEGATIVE
Cellular Cast, UA: 1
Glucose, UA: NEGATIVE mg/dL
Hgb urine dipstick: NEGATIVE
Nitrite: NEGATIVE
Protein, ur: NEGATIVE mg/dL
Specific Gravity, Urine: 1.027 (ref 1.005–1.030)
pH: 5.5 (ref 5.0–8.0)

## 2023-05-07 LAB — COMPREHENSIVE METABOLIC PANEL
ALT: 28 U/L (ref 0–44)
AST: 15 U/L (ref 15–41)
Albumin: 4.5 g/dL (ref 3.5–5.0)
Alkaline Phosphatase: 36 U/L — ABNORMAL LOW (ref 38–126)
Anion gap: 11 (ref 5–15)
BUN: 25 mg/dL — ABNORMAL HIGH (ref 6–20)
CO2: 24 mmol/L (ref 22–32)
Calcium: 10.1 mg/dL (ref 8.9–10.3)
Chloride: 102 mmol/L (ref 98–111)
Creatinine, Ser: 1.54 mg/dL — ABNORMAL HIGH (ref 0.61–1.24)
GFR, Estimated: 52 mL/min — ABNORMAL LOW (ref >60)
Glucose, Bld: 118 mg/dL — ABNORMAL HIGH (ref 70–99)
Potassium: 3.7 mmol/L (ref 3.5–5.1)
Sodium: 137 mmol/L (ref 135–145)
Total Bilirubin: 0.3 mg/dL (ref 0.3–1.2)
Total Protein: 8.3 g/dL — ABNORMAL HIGH (ref 6.5–8.1)

## 2023-05-07 NOTE — ED Triage Notes (Signed)
Patient here POV from Home.  Endorses Left Sided Flank Pain that began 5 Days ago. No Dysuria. No Known Fevers. No N/V/D.   NAD Noted During Triage. A&Ox4. Gcs 15. Ambulatory.

## 2023-05-07 NOTE — ED Notes (Signed)
No answer when called times 3 by Arlyss Repress, NT

## 2023-05-09 DIAGNOSIS — R109 Unspecified abdominal pain: Secondary | ICD-10-CM | POA: Diagnosis not present

## 2023-05-12 DIAGNOSIS — N200 Calculus of kidney: Secondary | ICD-10-CM | POA: Diagnosis not present

## 2023-05-12 DIAGNOSIS — R109 Unspecified abdominal pain: Secondary | ICD-10-CM | POA: Diagnosis not present

## 2023-05-24 DIAGNOSIS — K219 Gastro-esophageal reflux disease without esophagitis: Secondary | ICD-10-CM | POA: Diagnosis not present

## 2023-05-24 DIAGNOSIS — K5903 Drug induced constipation: Secondary | ICD-10-CM | POA: Diagnosis not present

## 2023-05-26 DIAGNOSIS — M47816 Spondylosis without myelopathy or radiculopathy, lumbar region: Secondary | ICD-10-CM | POA: Diagnosis not present

## 2023-05-26 DIAGNOSIS — K5900 Constipation, unspecified: Secondary | ICD-10-CM | POA: Diagnosis not present

## 2023-05-26 DIAGNOSIS — M545 Low back pain, unspecified: Secondary | ICD-10-CM | POA: Diagnosis not present

## 2023-05-26 DIAGNOSIS — N200 Calculus of kidney: Secondary | ICD-10-CM | POA: Diagnosis not present

## 2023-09-20 DIAGNOSIS — K219 Gastro-esophageal reflux disease without esophagitis: Secondary | ICD-10-CM | POA: Diagnosis not present

## 2023-09-20 DIAGNOSIS — G2581 Restless legs syndrome: Secondary | ICD-10-CM | POA: Diagnosis not present

## 2023-09-20 DIAGNOSIS — E538 Deficiency of other specified B group vitamins: Secondary | ICD-10-CM | POA: Diagnosis not present

## 2023-09-20 DIAGNOSIS — E782 Mixed hyperlipidemia: Secondary | ICD-10-CM | POA: Diagnosis not present

## 2023-09-20 DIAGNOSIS — G894 Chronic pain syndrome: Secondary | ICD-10-CM | POA: Diagnosis not present

## 2023-09-20 DIAGNOSIS — E559 Vitamin D deficiency, unspecified: Secondary | ICD-10-CM | POA: Diagnosis not present

## 2023-09-20 DIAGNOSIS — I10 Essential (primary) hypertension: Secondary | ICD-10-CM | POA: Diagnosis not present

## 2023-09-20 DIAGNOSIS — R7303 Prediabetes: Secondary | ICD-10-CM | POA: Diagnosis not present

## 2023-09-20 DIAGNOSIS — R252 Cramp and spasm: Secondary | ICD-10-CM | POA: Diagnosis not present

## 2023-09-20 DIAGNOSIS — R519 Headache, unspecified: Secondary | ICD-10-CM | POA: Diagnosis not present

## 2023-09-22 IMAGING — CR DG WRIST COMPLETE 3+V*R*
4 series · 4 of 4 positions shown · non-contrast
Comparison: Most recent radiograph 07/28/2021

CLINICAL DATA: Closed fracture of distal end of right radius,
unspecified fracture morphology, initial encounter. Status post ORIF
right wrist fracture.

EXAM:
RIGHT WRIST - COMPLETE 3+ VIEW

[wrist pa]
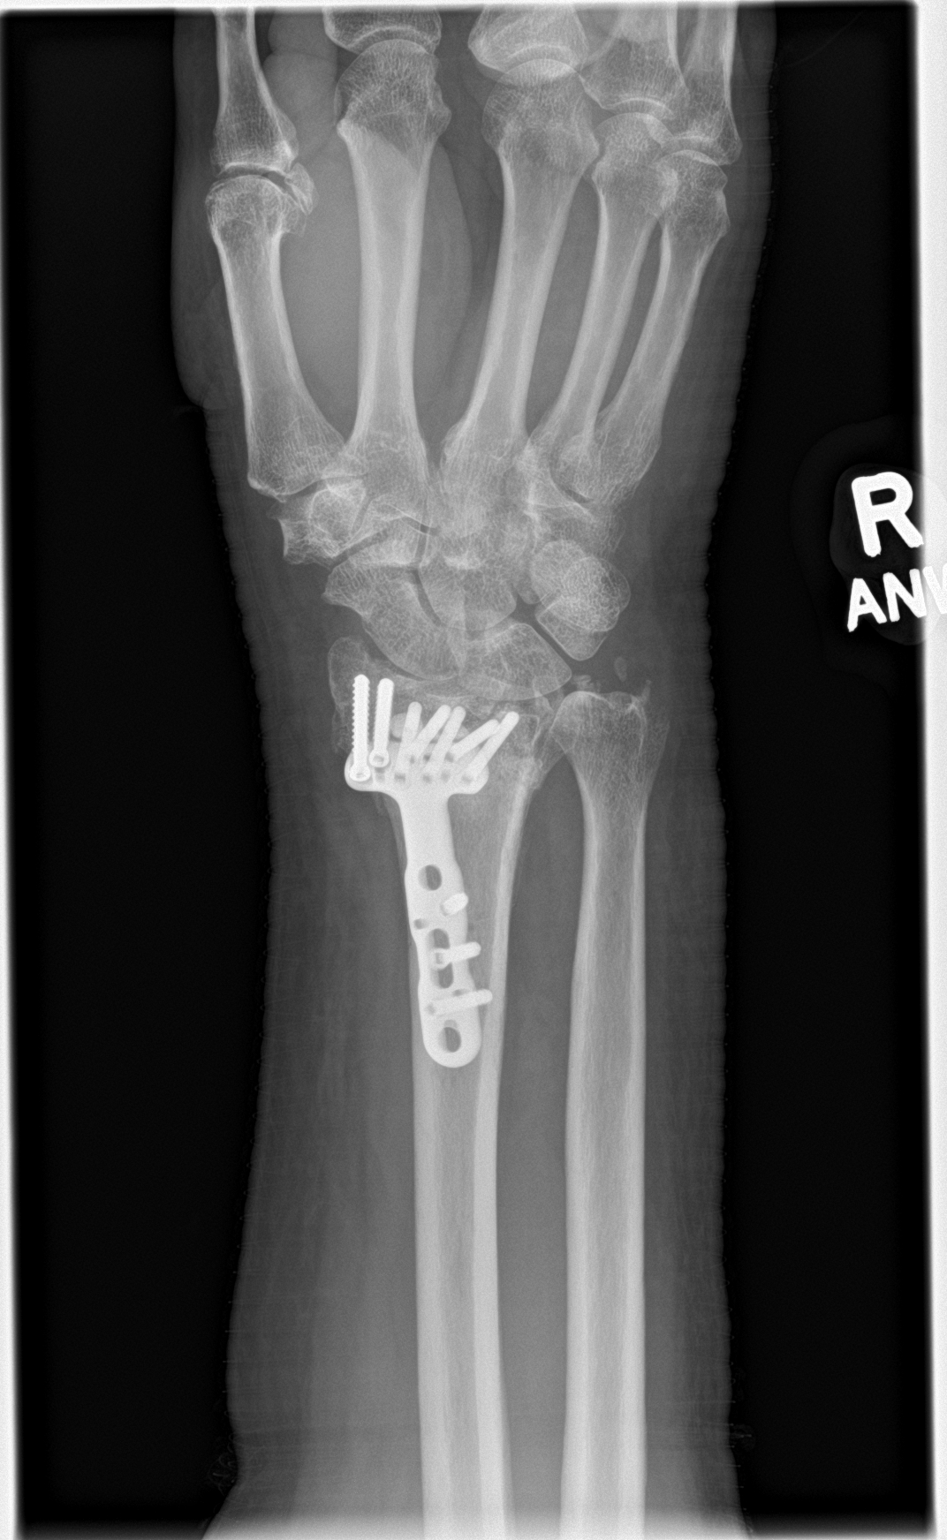

[wrist obl]
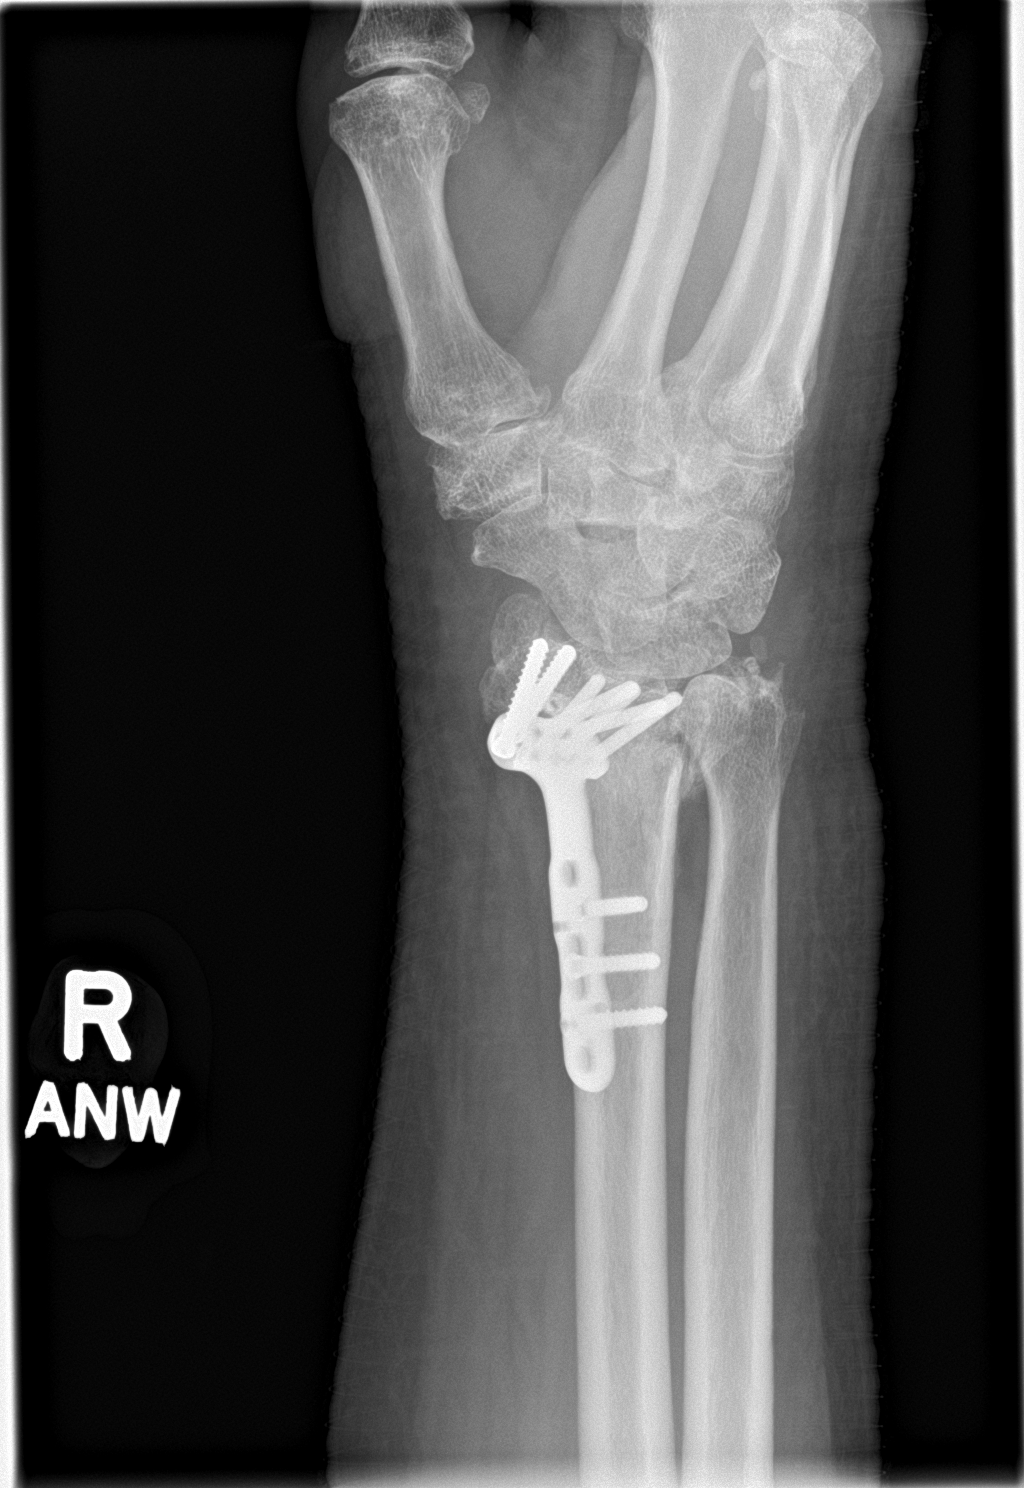

[wrist lat]
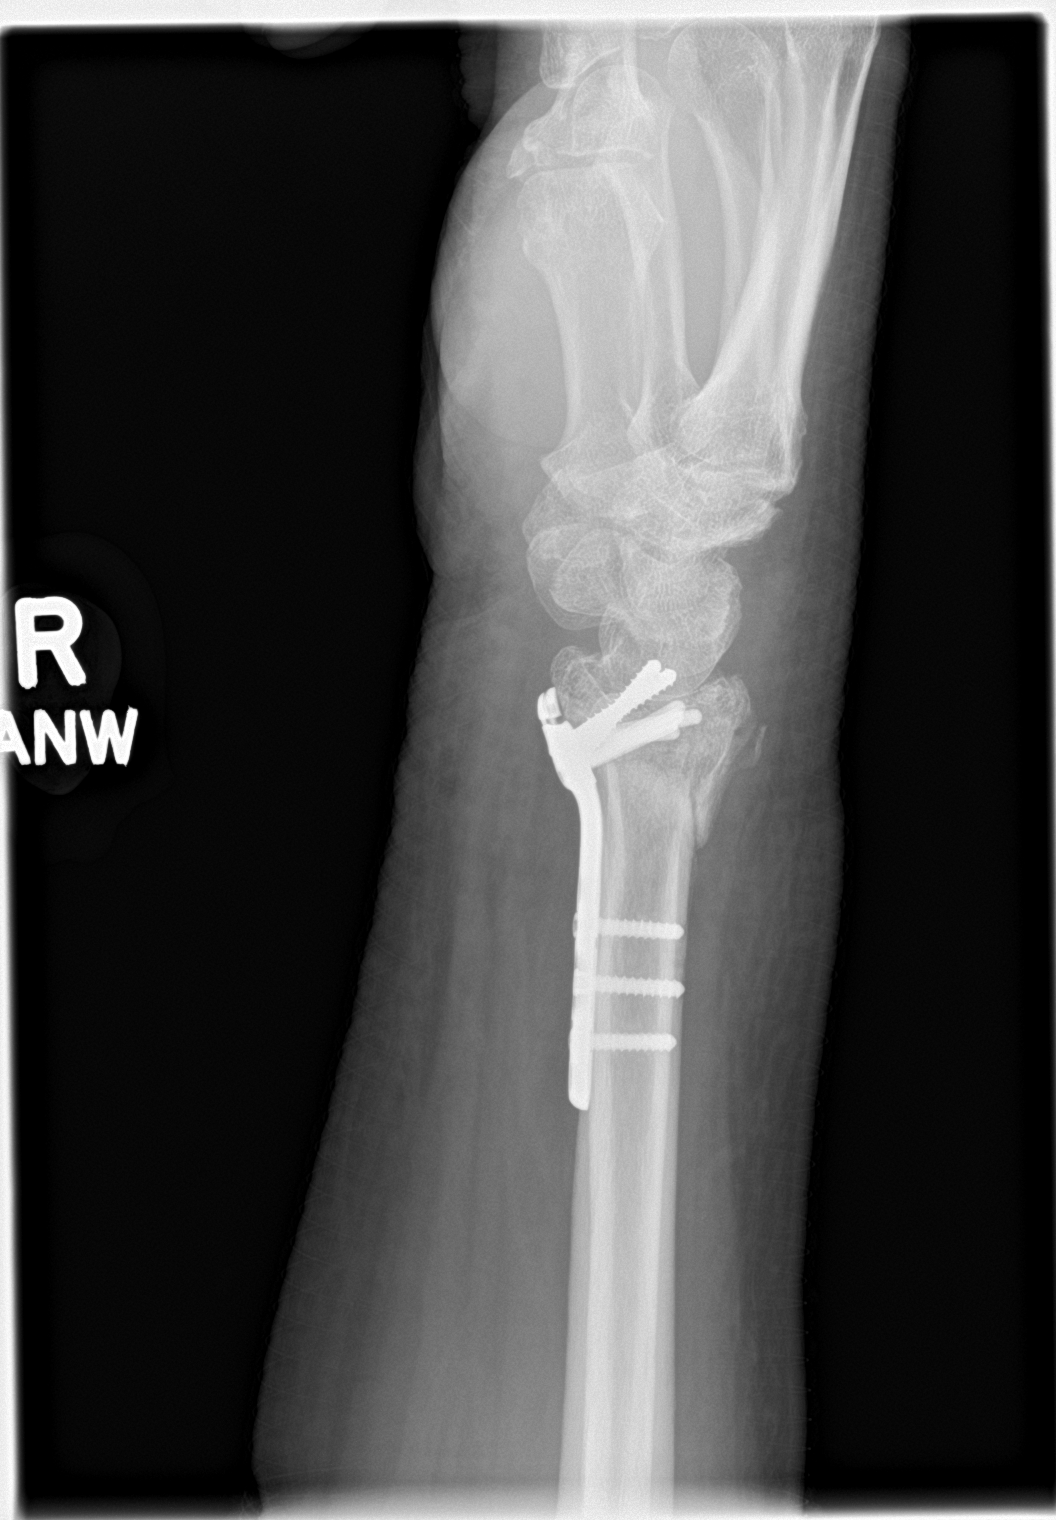

[wrist navicular]
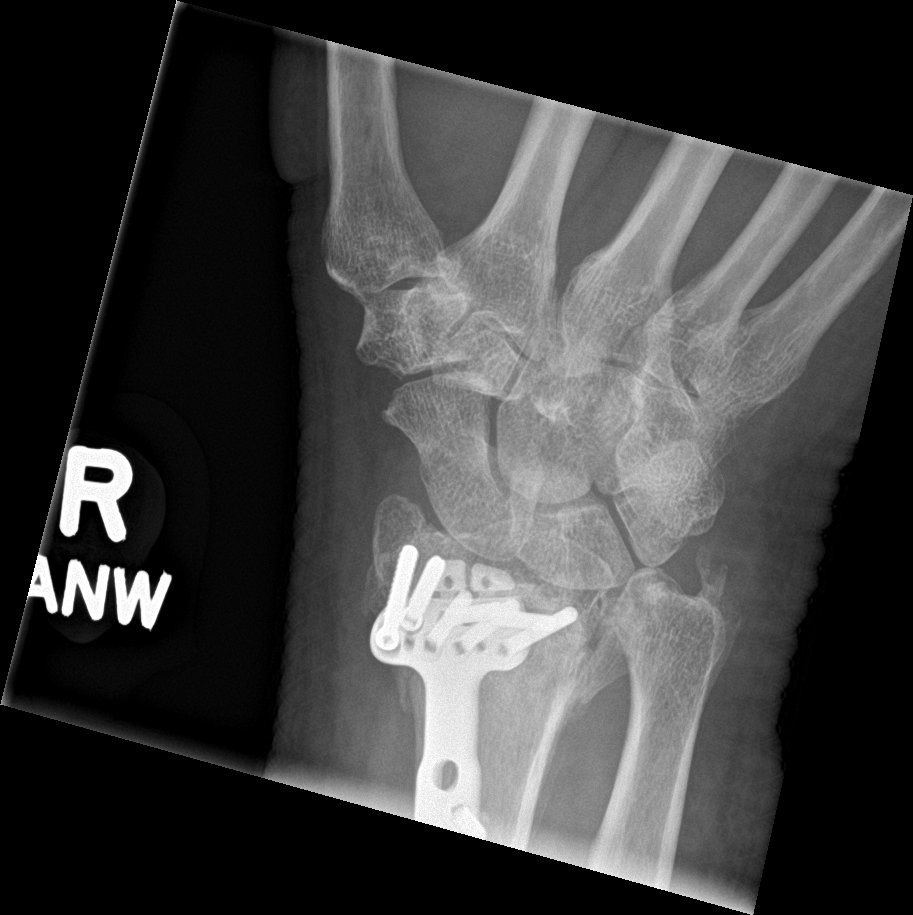

[4 of 4 positions shown; findings below may reference images not displayed]

FINDINGS: Volar plate and multi screw fixation of distal radius fracture.
There is some interval fracture healing with decreased conspicuity
of fracture lines some peripheral callus formation. Unchanged
alignment with stable appearance of surgical hardware. Some healing
of ulna styloid fracture with partial bony bridging. Overlying
splint material has been removed. Persistent generalized soft tissue
edema.
IMPRESSION: Stable distal radial hardware with unchanged alignment. Some
interval healing of distal radius and ulna styloid fractures.

## 2023-12-16 IMAGING — DX DG WRIST COMPLETE 3+V*R*
4 series · 4 of 4 positions shown · non-contrast
Comparison: X-ray wrist 09/27/2021; X-ray hand 07/09/2021.

CLINICAL DATA: Right distal ulna pain radiates into fifth finger,
and notes unable to straighten out finger completely. History of
Right distal radius fracture.

EXAM:
RIGHT WRIST - COMPLETE 3+ VIEW

[wrist ap]
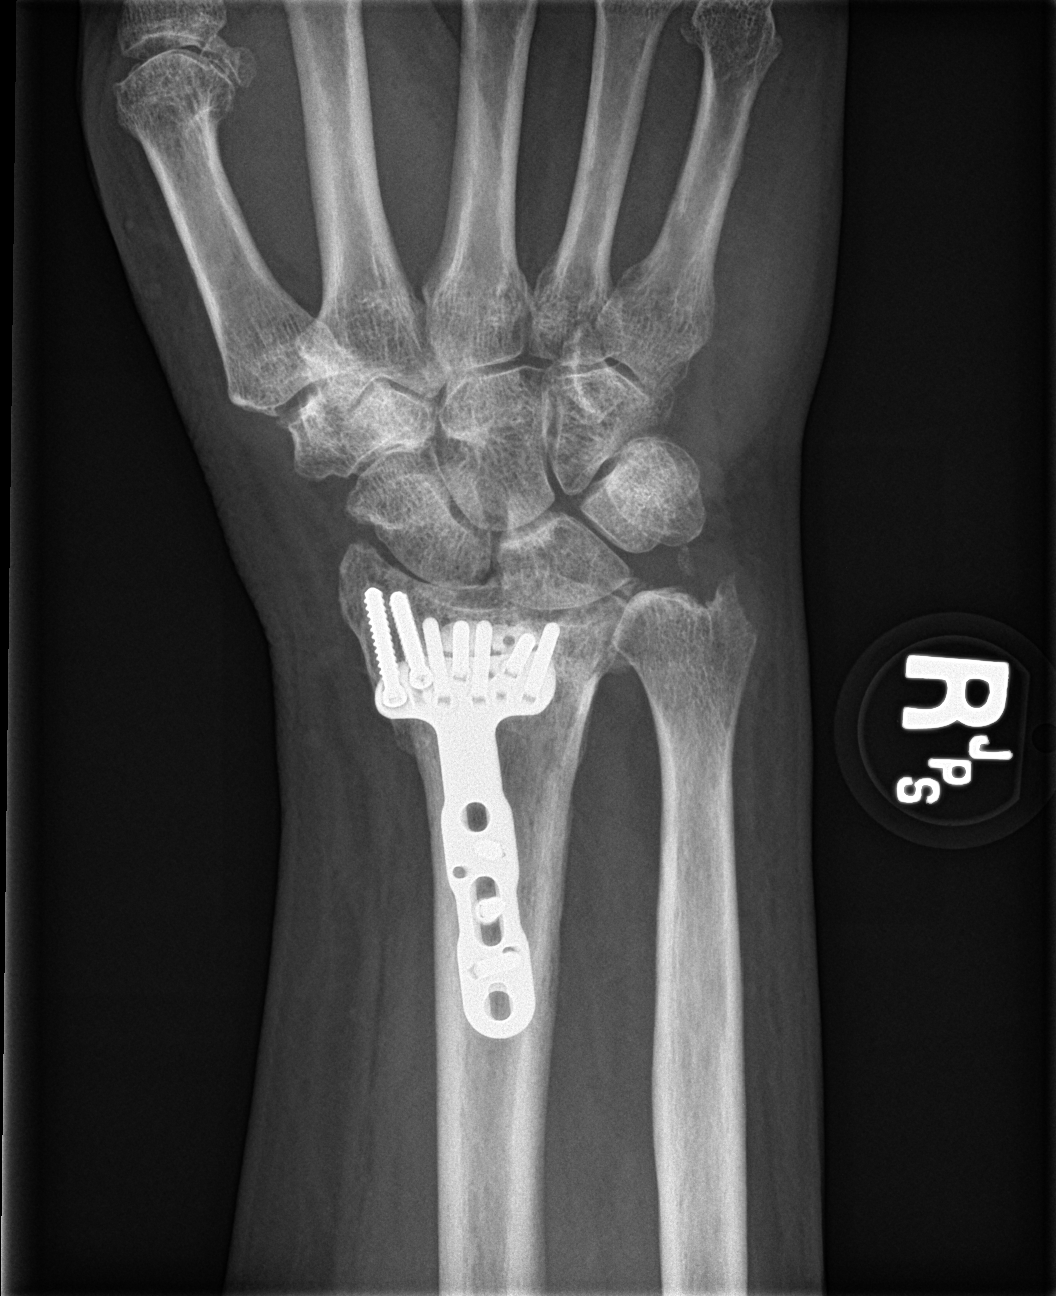

[wrist obl]
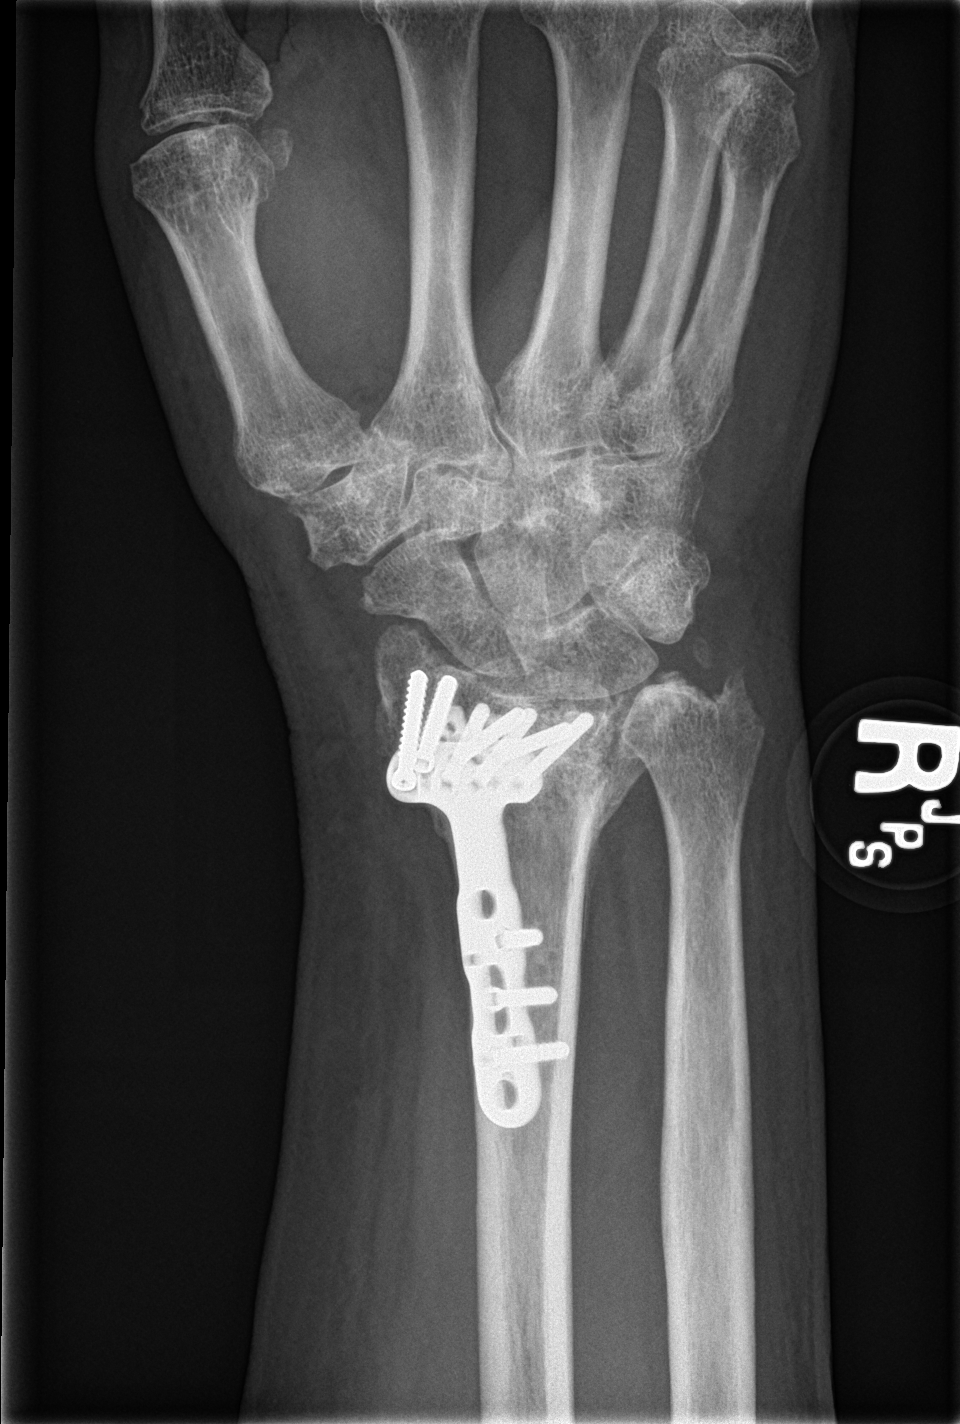

[wrist lat]
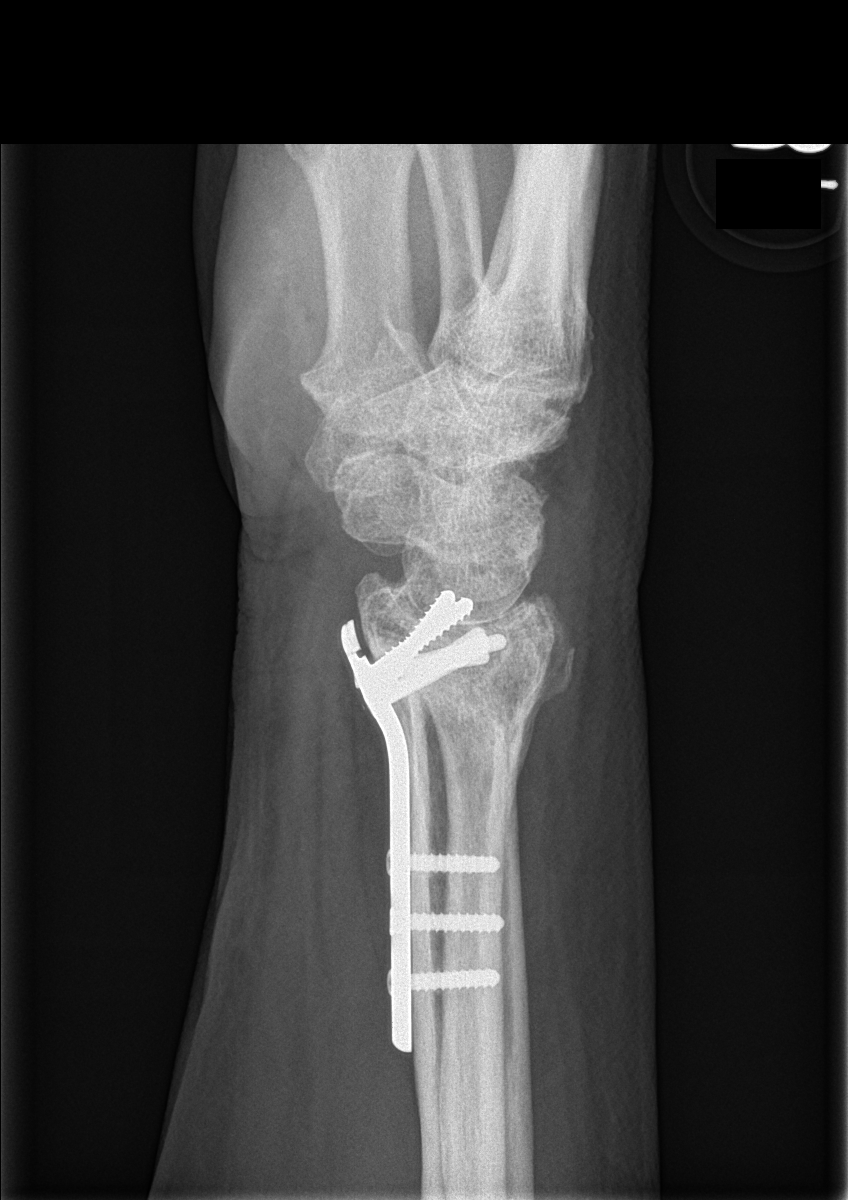

[wrist navicular]
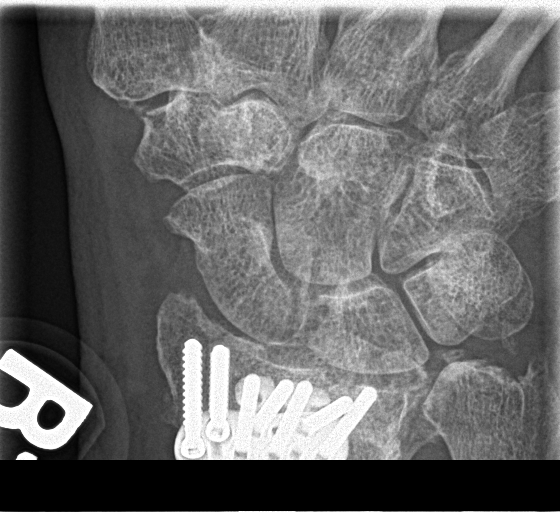

[4 of 4 positions shown; findings below may reference images not displayed]

FINDINGS: Postsurgical changes are again seen of volar plate and screw
fixation of the distal radius. There is progressive healing
sclerosis of the previously seen distal radial fracture lines. Mild
ulnar positive variance is similar to prior. Moderate radiocarpal
joint space narrowing, unchanged. Moderate triscaphe joint and thumb
carpometacarpal joint osteoarthritis.
IMPRESSION: Status post distal radial instrumented ORIF. Unchanged near anatomic
alignment with progressive moderate healing.

## 2024-02-06 DIAGNOSIS — D84821 Immunodeficiency due to drugs: Secondary | ICD-10-CM | POA: Diagnosis not present

## 2024-02-06 DIAGNOSIS — K5900 Constipation, unspecified: Secondary | ICD-10-CM | POA: Diagnosis not present

## 2024-02-06 DIAGNOSIS — I70219 Atherosclerosis of native arteries of extremities with intermittent claudication, unspecified extremity: Secondary | ICD-10-CM | POA: Diagnosis not present

## 2024-02-06 DIAGNOSIS — Z7982 Long term (current) use of aspirin: Secondary | ICD-10-CM | POA: Diagnosis not present

## 2024-02-06 DIAGNOSIS — Z7984 Long term (current) use of oral hypoglycemic drugs: Secondary | ICD-10-CM | POA: Diagnosis not present

## 2024-02-06 DIAGNOSIS — Z823 Family history of stroke: Secondary | ICD-10-CM | POA: Diagnosis not present

## 2024-02-06 DIAGNOSIS — E785 Hyperlipidemia, unspecified: Secondary | ICD-10-CM | POA: Diagnosis not present

## 2024-02-06 DIAGNOSIS — H547 Unspecified visual loss: Secondary | ICD-10-CM | POA: Diagnosis not present

## 2024-02-06 DIAGNOSIS — M1990 Unspecified osteoarthritis, unspecified site: Secondary | ICD-10-CM | POA: Diagnosis not present

## 2024-02-06 DIAGNOSIS — F320 Major depressive disorder, single episode, mild: Secondary | ICD-10-CM | POA: Diagnosis not present

## 2024-02-06 DIAGNOSIS — Z880 Allergy status to penicillin: Secondary | ICD-10-CM | POA: Diagnosis not present

## 2024-02-06 DIAGNOSIS — Z8249 Family history of ischemic heart disease and other diseases of the circulatory system: Secondary | ICD-10-CM | POA: Diagnosis not present

## 2024-03-20 DIAGNOSIS — K219 Gastro-esophageal reflux disease without esophagitis: Secondary | ICD-10-CM | POA: Diagnosis not present

## 2024-03-20 DIAGNOSIS — R1030 Lower abdominal pain, unspecified: Secondary | ICD-10-CM | POA: Diagnosis not present

## 2024-03-20 DIAGNOSIS — G44229 Chronic tension-type headache, not intractable: Secondary | ICD-10-CM | POA: Diagnosis not present

## 2024-03-20 DIAGNOSIS — Z0000 Encounter for general adult medical examination without abnormal findings: Secondary | ICD-10-CM | POA: Diagnosis not present

## 2024-03-20 DIAGNOSIS — I10 Essential (primary) hypertension: Secondary | ICD-10-CM | POA: Diagnosis not present

## 2024-03-20 DIAGNOSIS — Z7984 Long term (current) use of oral hypoglycemic drugs: Secondary | ICD-10-CM | POA: Diagnosis not present

## 2024-03-20 DIAGNOSIS — E559 Vitamin D deficiency, unspecified: Secondary | ICD-10-CM | POA: Diagnosis not present

## 2024-03-20 DIAGNOSIS — G894 Chronic pain syndrome: Secondary | ICD-10-CM | POA: Diagnosis not present

## 2024-03-20 DIAGNOSIS — E538 Deficiency of other specified B group vitamins: Secondary | ICD-10-CM | POA: Diagnosis not present

## 2024-03-20 DIAGNOSIS — G2581 Restless legs syndrome: Secondary | ICD-10-CM | POA: Diagnosis not present

## 2024-03-20 DIAGNOSIS — E782 Mixed hyperlipidemia: Secondary | ICD-10-CM | POA: Diagnosis not present

## 2024-03-20 DIAGNOSIS — Z131 Encounter for screening for diabetes mellitus: Secondary | ICD-10-CM | POA: Diagnosis not present

## 2024-03-20 DIAGNOSIS — Z79899 Other long term (current) drug therapy: Secondary | ICD-10-CM | POA: Diagnosis not present

## 2024-03-20 DIAGNOSIS — Z125 Encounter for screening for malignant neoplasm of prostate: Secondary | ICD-10-CM | POA: Diagnosis not present

## 2024-03-21 DIAGNOSIS — T148XXA Other injury of unspecified body region, initial encounter: Secondary | ICD-10-CM | POA: Diagnosis not present

## 2024-03-21 DIAGNOSIS — T1490XA Injury, unspecified, initial encounter: Secondary | ICD-10-CM | POA: Diagnosis not present

## 2024-03-21 DIAGNOSIS — S8991XA Unspecified injury of right lower leg, initial encounter: Secondary | ICD-10-CM | POA: Diagnosis not present

## 2024-03-21 DIAGNOSIS — S50311A Abrasion of right elbow, initial encounter: Secondary | ICD-10-CM | POA: Diagnosis not present

## 2024-03-21 DIAGNOSIS — S299XXA Unspecified injury of thorax, initial encounter: Secondary | ICD-10-CM | POA: Diagnosis not present

## 2024-03-21 DIAGNOSIS — M25561 Pain in right knee: Secondary | ICD-10-CM | POA: Diagnosis not present

## 2024-03-21 DIAGNOSIS — Y990 Civilian activity done for income or pay: Secondary | ICD-10-CM | POA: Diagnosis not present

## 2024-03-21 DIAGNOSIS — S59901A Unspecified injury of right elbow, initial encounter: Secondary | ICD-10-CM | POA: Diagnosis not present

## 2024-03-21 DIAGNOSIS — W208XXA Other cause of strike by thrown, projected or falling object, initial encounter: Secondary | ICD-10-CM | POA: Diagnosis not present

## 2024-03-21 DIAGNOSIS — M25521 Pain in right elbow: Secondary | ICD-10-CM | POA: Diagnosis not present

## 2024-03-21 DIAGNOSIS — M19021 Primary osteoarthritis, right elbow: Secondary | ICD-10-CM | POA: Diagnosis not present

## 2024-03-21 DIAGNOSIS — M1711 Unilateral primary osteoarthritis, right knee: Secondary | ICD-10-CM | POA: Diagnosis not present

## 2024-03-28 DIAGNOSIS — E538 Deficiency of other specified B group vitamins: Secondary | ICD-10-CM | POA: Diagnosis not present

## 2024-04-04 DIAGNOSIS — E538 Deficiency of other specified B group vitamins: Secondary | ICD-10-CM | POA: Diagnosis not present

## 2024-04-15 DIAGNOSIS — E538 Deficiency of other specified B group vitamins: Secondary | ICD-10-CM | POA: Diagnosis not present

## 2024-04-22 DIAGNOSIS — E538 Deficiency of other specified B group vitamins: Secondary | ICD-10-CM | POA: Diagnosis not present

## 2024-04-24 DIAGNOSIS — R109 Unspecified abdominal pain: Secondary | ICD-10-CM | POA: Diagnosis not present

## 2024-05-14 DIAGNOSIS — E538 Deficiency of other specified B group vitamins: Secondary | ICD-10-CM | POA: Diagnosis not present

## 2024-05-14 DIAGNOSIS — R10819 Abdominal tenderness, unspecified site: Secondary | ICD-10-CM | POA: Diagnosis not present

## 2024-05-14 DIAGNOSIS — R109 Unspecified abdominal pain: Secondary | ICD-10-CM | POA: Diagnosis not present

## 2024-05-16 DIAGNOSIS — R109 Unspecified abdominal pain: Secondary | ICD-10-CM | POA: Diagnosis not present

## 2024-09-20 DIAGNOSIS — G894 Chronic pain syndrome: Secondary | ICD-10-CM | POA: Diagnosis not present

## 2024-09-20 DIAGNOSIS — E782 Mixed hyperlipidemia: Secondary | ICD-10-CM | POA: Diagnosis not present

## 2024-09-20 DIAGNOSIS — R519 Headache, unspecified: Secondary | ICD-10-CM | POA: Diagnosis not present

## 2024-09-20 DIAGNOSIS — E538 Deficiency of other specified B group vitamins: Secondary | ICD-10-CM | POA: Diagnosis not present

## 2024-09-20 DIAGNOSIS — E559 Vitamin D deficiency, unspecified: Secondary | ICD-10-CM | POA: Diagnosis not present

## 2024-09-20 DIAGNOSIS — I10 Essential (primary) hypertension: Secondary | ICD-10-CM | POA: Diagnosis not present

## 2024-09-20 DIAGNOSIS — K219 Gastro-esophageal reflux disease without esophagitis: Secondary | ICD-10-CM | POA: Diagnosis not present

## 2024-09-20 DIAGNOSIS — G2581 Restless legs syndrome: Secondary | ICD-10-CM | POA: Diagnosis not present
